# Patient Record
Sex: Female | Born: 1985 | Race: Black or African American | Hispanic: No | Marital: Single | Smoking: Former smoker
Health system: Southern US, Community
[De-identification: ages and names within clinical notes are randomized; demographics above are authoritative.]

## PROBLEM LIST (undated history)

## (undated) DIAGNOSIS — I2510 Atherosclerotic heart disease of native coronary artery without angina pectoris: Secondary | ICD-10-CM

## (undated) DIAGNOSIS — J45909 Unspecified asthma, uncomplicated: Secondary | ICD-10-CM

## (undated) DIAGNOSIS — E7800 Pure hypercholesterolemia, unspecified: Secondary | ICD-10-CM

## (undated) DIAGNOSIS — I214 Non-ST elevation (NSTEMI) myocardial infarction: Secondary | ICD-10-CM

## (undated) HISTORY — PX: BREAST LUMPECTOMY: SHX2

## (undated) HISTORY — PX: CARDIAC CATHETERIZATION: SHX172

---

## 2014-05-30 ENCOUNTER — Encounter (HOSPITAL_BASED_OUTPATIENT_CLINIC_OR_DEPARTMENT_OTHER): Payer: Self-pay | Admitting: Emergency Medicine

## 2014-05-30 DIAGNOSIS — Z720 Tobacco use: Secondary | ICD-10-CM | POA: Insufficient documentation

## 2014-05-30 DIAGNOSIS — J45909 Unspecified asthma, uncomplicated: Secondary | ICD-10-CM | POA: Insufficient documentation

## 2014-05-30 DIAGNOSIS — Z79899 Other long term (current) drug therapy: Secondary | ICD-10-CM | POA: Insufficient documentation

## 2014-05-30 DIAGNOSIS — N946 Dysmenorrhea, unspecified: Secondary | ICD-10-CM | POA: Insufficient documentation

## 2014-05-30 DIAGNOSIS — Z3202 Encounter for pregnancy test, result negative: Secondary | ICD-10-CM | POA: Insufficient documentation

## 2014-05-30 NOTE — ED Notes (Signed)
Pt states she has had abd pain for 2-3 days

## 2014-05-31 ENCOUNTER — Ambulatory Visit (HOSPITAL_BASED_OUTPATIENT_CLINIC_OR_DEPARTMENT_OTHER)
Admit: 2014-05-31 | Discharge: 2014-05-31 | Disposition: A | Discharge status: Home / Self Care | Payer: Self-pay | Attending: Emergency Medicine | Admitting: Emergency Medicine

## 2014-05-31 ENCOUNTER — Ambulatory Visit (HOSPITAL_BASED_OUTPATIENT_CLINIC_OR_DEPARTMENT_OTHER)
Admission: RE | Admit: 2014-05-31 | Discharge: 2014-05-31 | Disposition: A | Discharge status: Home / Self Care | Payer: Self-pay | Source: Ambulatory Visit | Attending: Emergency Medicine | Admitting: Emergency Medicine

## 2014-05-31 ENCOUNTER — Emergency Department (HOSPITAL_BASED_OUTPATIENT_CLINIC_OR_DEPARTMENT_OTHER)
Admission: EM | Admit: 2014-05-31 | Discharge: 2014-05-31 | Disposition: A | Discharge status: Home / Self Care | Payer: Self-pay | Attending: Emergency Medicine | Admitting: Emergency Medicine

## 2014-05-31 DIAGNOSIS — N926 Irregular menstruation, unspecified: Secondary | ICD-10-CM | POA: Insufficient documentation

## 2014-05-31 DIAGNOSIS — N946 Dysmenorrhea, unspecified: Secondary | ICD-10-CM

## 2014-05-31 HISTORY — DX: Unspecified asthma, uncomplicated: J45.909

## 2014-05-31 LAB — URINALYSIS, ROUTINE W REFLEX MICROSCOPIC
BILIRUBIN URINE: NEGATIVE
Glucose, UA: NEGATIVE mg/dL
Ketones, ur: NEGATIVE mg/dL
Leukocytes, UA: NEGATIVE
NITRITE: NEGATIVE
PH: 5.5 (ref 5.0–8.0)
Protein, ur: NEGATIVE mg/dL
Specific Gravity, Urine: 1.03 (ref 1.005–1.030)
Urobilinogen, UA: 0.2 mg/dL (ref 0.0–1.0)

## 2014-05-31 LAB — URINE MICROSCOPIC-ADD ON

## 2014-05-31 LAB — GC/CHLAMYDIA PROBE AMP (~~LOC~~) NOT AT ARMC
Chlamydia: NEGATIVE
Neisseria Gonorrhea: NEGATIVE

## 2014-05-31 LAB — PREGNANCY, URINE: Preg Test, Ur: NEGATIVE

## 2014-05-31 MED ORDER — HYDROCODONE-ACETAMINOPHEN 5-325 MG PO TABS: 1.0000 | ORAL_TABLET | Freq: Four times a day (QID) | ORAL | Status: DC | PRN

## 2014-05-31 NOTE — ED Provider Notes (Signed)
CSN: [id number]     Arrival date & time 05/30/14  2303 History   First MD Initiated Contact with Patient 05/31/14 0103     Chief Complaint  Patient presents with  . Abdominal Pain     (Consider location/radiation/quality/duration/timing/severity/associated sxs/prior Treatment) HPI  This is a 29 year old female with a history of painful menses. She is here with menses that began 4 days ago. She is having pelvic pain associated with this cycle which is worse than previous cycles. She has difficulty characterizing the pain but states it is moderate to severe. It is worse with movement or palpation. It is only partly eased by ibuprofen. She has had chills. She denies nausea, vomiting or dysuria. She has had lightheadedness with it.  Past Medical History  Diagnosis Date  . Asthma    History reviewed. No pertinent past surgical history. History reviewed. No pertinent family history. History  Substance Use Topics  . Smoking status: Current Every Day Smoker  . Smokeless tobacco: Not on file  . Alcohol Use: No   OB History    No data available     Review of Systems  All other systems reviewed and are negative.   Allergies  Review of patient's allergies indicates no known allergies.  Home Medications   Prior to Admission medications   Medication Sig Start Date End Date Taking? Authorizing Provider  ALBUTEROL IN Inhale into the lungs.   Yes Historical Provider, MD   BP 113/73 mmHg  Pulse 65  Temp(Src) 98.7 F (37.1 C) (Oral)  Resp 18  Ht 5\' 8"  (1.727 m)  Wt 145 lb (65.772 kg)  BMI 22.05 kg/m2  SpO2 100%  LMP 05/30/2014   Physical Exam  General: Well-developed, well-nourished female in no acute distress; appearance consistent with age of record HENT: normocephalic; atraumatic Eyes: pupils equal, round and reactive to light; extraocular muscles intact Neck: supple Heart: regular rate and rhythm Lungs: clear to auscultation bilaterally Abdomen: soft; nondistended;  suprapubic tenderness, right greater than left; no masses or hepatosplenomegaly; bowel sounds present GU: Normal external genitalia; vaginal bleeding; no cervical motion tenderness; adnexal tenderness, right greater than left Extremities: No deformity; full range of motion; pulses normal Neurologic: Sleeping but readily awakened, alert and oriented; motor function intact in all extremities and symmetric; no facial droop Skin: Warm and dry Psychiatric: Flat affect    ED Course  Procedures (including critical care time)   MDM   Nursing notes and vitals signs, including pulse oximetry, reviewed.  Summary of this visit's results, reviewed by myself:  Labs:  Results for orders placed or performed during the hospital encounter of 05/31/14 (from the past 24 hour(s))  Urinalysis, Routine w reflex microscopic     Status: Abnormal   Collection Time: 05/31/14 12:36 AM  Result Value Ref Range   Color, Urine YELLOW YELLOW   APPearance CLEAR CLEAR   Specific Gravity, Urine 1.030 1.005 - 1.030   pH 5.5 5.0 - 8.0   Glucose, UA NEGATIVE NEGATIVE mg/dL   Hgb urine dipstick LARGE (A) NEGATIVE   Bilirubin Urine NEGATIVE NEGATIVE   Ketones, ur NEGATIVE NEGATIVE mg/dL   Protein, ur NEGATIVE NEGATIVE mg/dL   Urobilinogen, UA 0.2 0.0 - 1.0 mg/dL   Nitrite NEGATIVE NEGATIVE   Leukocytes, UA NEGATIVE NEGATIVE  Pregnancy, urine     Status: None   Collection Time: 05/31/14 12:36 AM  Result Value Ref Range   Preg Test, Ur NEGATIVE NEGATIVE  Urine microscopic-add on     Status: Abnormal  Collection Time: 05/31/14 12:36 AM  Result Value Ref Range   Squamous Epithelial / LPF FEW (A) RARE   WBC, UA 0-2 <3 WBC/hpf   RBC / HPF 7-10 <3 RBC/hpf   Bacteria, UA FEW (A) RARE   Urine-Other MUCOUS PRESENT    1:24 AM We'll have patient return for pelvic ultrasound later today. We have referred her to [facility] and she has no local physician.   Paula Libra, MD 05/31/14 [phone]

## 2015-07-05 ENCOUNTER — Encounter (HOSPITAL_BASED_OUTPATIENT_CLINIC_OR_DEPARTMENT_OTHER): Payer: Self-pay | Admitting: *Deleted

## 2015-07-05 ENCOUNTER — Emergency Department (HOSPITAL_BASED_OUTPATIENT_CLINIC_OR_DEPARTMENT_OTHER)
Admission: EM | Admit: 2015-07-05 | Discharge: 2015-07-05 | Disposition: A | Discharge status: Home / Self Care | Payer: Self-pay | Attending: Emergency Medicine | Admitting: Emergency Medicine

## 2015-07-05 DIAGNOSIS — N760 Acute vaginitis: Secondary | ICD-10-CM | POA: Insufficient documentation

## 2015-07-05 DIAGNOSIS — Z79899 Other long term (current) drug therapy: Secondary | ICD-10-CM | POA: Insufficient documentation

## 2015-07-05 DIAGNOSIS — J45909 Unspecified asthma, uncomplicated: Secondary | ICD-10-CM | POA: Insufficient documentation

## 2015-07-05 DIAGNOSIS — Z3202 Encounter for pregnancy test, result negative: Secondary | ICD-10-CM | POA: Insufficient documentation

## 2015-07-05 DIAGNOSIS — N946 Dysmenorrhea, unspecified: Secondary | ICD-10-CM | POA: Insufficient documentation

## 2015-07-05 DIAGNOSIS — F17210 Nicotine dependence, cigarettes, uncomplicated: Secondary | ICD-10-CM | POA: Insufficient documentation

## 2015-07-05 DIAGNOSIS — Z8639 Personal history of other endocrine, nutritional and metabolic disease: Secondary | ICD-10-CM | POA: Insufficient documentation

## 2015-07-05 HISTORY — DX: Pure hypercholesterolemia, unspecified: E78.00

## 2015-07-05 LAB — URINE MICROSCOPIC-ADD ON

## 2015-07-05 LAB — URINALYSIS, ROUTINE W REFLEX MICROSCOPIC
Bilirubin Urine: NEGATIVE
Glucose, UA: NEGATIVE mg/dL
KETONES UR: NEGATIVE mg/dL
Leukocytes, UA: NEGATIVE
Nitrite: NEGATIVE
PROTEIN: 30 mg/dL — AB
Specific Gravity, Urine: 1.01 (ref 1.005–1.030)
pH: 6.5 (ref 5.0–8.0)

## 2015-07-05 LAB — WET PREP, GENITAL
Sperm: NONE SEEN
Trich, Wet Prep: NONE SEEN
Yeast Wet Prep HPF POC: NONE SEEN

## 2015-07-05 LAB — PREGNANCY, URINE: PREG TEST UR: NEGATIVE

## 2015-07-05 MED ORDER — IBUPROFEN 800 MG PO TABS
800.0000 mg | ORAL_TABLET | Freq: Once | ORAL | Status: AC
  Administered 2015-07-05: 800 mg via ORAL
  Filled 2015-07-05: qty 1

## 2015-07-05 MED ORDER — METRONIDAZOLE 500 MG PO TABS: 500.0000 mg | ORAL_TABLET | Freq: Two times a day (BID) | ORAL | Status: DC

## 2015-07-05 NOTE — ED Provider Notes (Signed)
CSN: [id number]     Arrival date & time 07/05/15  4098 History   First MD Initiated Contact with Patient 07/05/15 [phone]     Chief Complaint  Patient presents with  . Abdominal Pain     (Consider location/radiation/quality/duration/timing/severity/associated sxs/prior Treatment) HPI Comments: 30 year old female with history of asthma presents for pain with her menses. The patient reports that for 10 years or more she has had severe pain when she has her menstrual period. The patient reports that she has been evaluated in the past by an OB/GYN but it has been several years. She has refused hormonal or oral contraceptive treatment for this issue. She is currently attempting to get pregnant. She reports this is the normal time that she would expect her period. Her period started last night. She says that she took 800 mg of ibuprofen for it last night. She says that the pain is unbearable and it will feel like somebody is stabbing her with a knife. This is the same pain she always gets. She denies fevers, nausea, vomiting, diarrhea, constipation. No chest pain or shortness of breath. Reports normal urination. She says she was seen by her PCP last week and had a negative pregnancy test. She denies any excessive or heavy bleeding.  Patient is a 30 y.o. female presenting with abdominal pain.  Abdominal Pain Associated symptoms: vaginal bleeding   Associated symptoms: no chest pain, no chills, no constipation, no cough, no diarrhea, no dysuria, no fatigue, no fever, no nausea, no shortness of breath, no vaginal discharge and no vomiting     Past Medical History  Diagnosis Date  . Asthma   . High cholesterol    History reviewed. No pertinent past surgical history. No family history on file. Social History  Substance Use Topics  . Smoking status: Current Every Day Smoker -- 0.50 packs/day    Types: Cigarettes  . Smokeless tobacco: None  . Alcohol Use: No   OB History    No data available      Review of Systems  Constitutional: Negative for fever, chills and fatigue.  HENT: Negative for congestion, postnasal drip, rhinorrhea and sinus pressure.   Eyes: Negative for visual disturbance.  Respiratory: Negative for cough and shortness of breath.   Cardiovascular: Negative for chest pain and palpitations.  Gastrointestinal: Positive for abdominal pain. Negative for nausea, vomiting, diarrhea, constipation and abdominal distention.  Genitourinary: Positive for vaginal bleeding and vaginal pain. Negative for dysuria, urgency, frequency, flank pain, vaginal discharge and pelvic pain.  Musculoskeletal: Negative for myalgias and back pain.  Skin: Negative for rash.  Neurological: Negative for dizziness, weakness, light-headedness and headaches.  Hematological: Does not bruise/bleed easily.      Allergies  Review of patient's allergies indicates no known allergies.  Home Medications   Prior to Admission medications   Medication Sig Start Date End Date Taking? Authorizing Provider  ALBUTEROL IN Inhale into the lungs.   Yes Historical Provider, MD  metroNIDAZOLE (FLAGYL) 500 MG tablet Take 1 tablet (500 mg total) by mouth 2 (two) times daily. 07/05/15   Leta Baptist, MD   LMP 07/04/2015 Physical Exam  Constitutional: She is oriented to person, place, and time. She appears well-developed and well-nourished. No distress.  HENT:  Head: Normocephalic and atraumatic.  Right Ear: External ear normal.  Left Ear: External ear normal.  Nose: Nose normal.  Mouth/Throat: Oropharynx is clear and moist. No oropharyngeal exudate.  Eyes: EOM are normal. Pupils are equal, round, and reactive to light.  Neck: Normal range of motion. Neck supple.  Cardiovascular: Normal rate, regular rhythm, normal heart sounds and intact distal pulses.   No murmur heard. Pulmonary/Chest: Effort normal. No respiratory distress. She has no wheezes. She has no rales.  Abdominal: Soft. She exhibits no  distension. There is no tenderness (no tenderness on examination while talking to patient).  Genitourinary: Cervix exhibits no motion tenderness, no discharge and no friability. Right adnexum displays no mass and no fullness. Left adnexum displays no mass and no fullness. There is bleeding (mild) in the vagina. No tenderness in the vagina. No vaginal discharge found.  Patient with mild discomfort on pelvic examination along the pelvic area bilaterally  Musculoskeletal: Normal range of motion. She exhibits no edema or tenderness.  Neurological: She is alert and oriented to person, place, and time.  Skin: Skin is warm and dry. No rash noted. She is not diaphoretic.  Vitals reviewed.   ED Course  Procedures (including critical care time) Labs Review Labs Reviewed  WET PREP, GENITAL - Abnormal; Notable for the following:    Clue Cells Wet Prep HPF POC PRESENT (*)    WBC, Wet Prep HPF POC MANY (*)    All other components within normal limits  URINALYSIS, ROUTINE W REFLEX MICROSCOPIC (NOT AT [facility]) - Abnormal; Notable for the following:    Hgb urine dipstick LARGE (*)    Protein, ur 30 (*)    All other components within normal limits  URINE MICROSCOPIC-ADD ON - Abnormal; Notable for the following:    Squamous Epithelial / LPF 0-5 (*)    Bacteria, UA MANY (*)    Casts HYALINE CASTS (*)    All other components within normal limits  PREGNANCY, URINE  GC/CHLAMYDIA PROBE AMP ([place]) NOT AT [facility]    Imaging Review No results found. I have personally reviewed and evaluated these images and lab results as part of my medical decision-making.   EKG Interpretation None      MDM  Patient was seen and evaluated in stable condition. Mild amount of bleeding on pelvic examination consistent with menses. Patient not pregnant. Examination benign. Wet prep positive for BV. Patient updated on results. She was provided with a prescription for Flagyl. She was discharged home with instruction to  continue to take her ibuprofen and to complete the Flagyl and follow up with OB/GYN outpatient. Patient expressed understanding and agreement with plan of care. Final diagnoses:  Dysmenorrhea    1. Dysmenorrhea 2. Bacterial vaginosis    Leta BaptistEmily Roe Eden Rho, MD 07/05/15 1012

## 2015-07-05 NOTE — Discharge Instructions (Signed)
You were seen and evaluated today for your painful periods. Continue to use ibuprofen as prescribed by her primary care physician. Use heating pads as needed but never sleep with one on. You are also found on examination to have was called bacterial vaginosis. This can cause increase in your pain. Take the prescription antibiotics as directed. Follow up outpatient with an OB/GYN for more definitive management.   Bacterial Vaginosis Bacterial vaginosis is a vaginal infection that occurs when the normal balance of bacteria in the vagina is disrupted. It results from an overgrowth of certain bacteria. This is the most common vaginal infection in women of childbearing age. Treatment is important to prevent complications, especially in pregnant women, as it can cause a premature delivery. CAUSES  Bacterial vaginosis is caused by an increase in harmful bacteria that are normally present in smaller amounts in the vagina. Several different kinds of bacteria can cause bacterial vaginosis. However, the reason that the condition develops is not fully understood. RISK FACTORS Certain activities or behaviors can put you at an increased risk of developing bacterial vaginosis, including:  Having a new sex partner or multiple sex partners.  Douching.  Using an intrauterine device (IUD) for contraception. Women do not get bacterial vaginosis from toilet seats, bedding, swimming pools, or contact with objects around them. SIGNS AND SYMPTOMS  Some women with bacterial vaginosis have no signs or symptoms. Common symptoms include:  Grey vaginal discharge.  A fishlike odor with discharge, especially after sexual intercourse.  Itching or burning of the vagina and vulva.  Burning or pain with urination. DIAGNOSIS  Your health care provider will take a medical history and examine the vagina for signs of bacterial vaginosis. A sample of vaginal fluid may be taken. Your health care provider will look at this sample  under a microscope to check for bacteria and abnormal cells. A vaginal pH test may also be done.  TREATMENT  Bacterial vaginosis may be treated with antibiotic medicines. These may be given in the form of a pill or a vaginal cream. A second round of antibiotics may be prescribed if the condition comes back after treatment. Because bacterial vaginosis increases your risk for sexually transmitted diseases, getting treated can help reduce your risk for chlamydia, gonorrhea, HIV, and herpes. HOME CARE INSTRUCTIONS   Only take over-the-counter or prescription medicines as directed by your health care provider.  If antibiotic medicine was prescribed, take it as directed. Make sure you finish it even if you start to feel better.  Tell all sexual partners that you have a vaginal infection. They should see their health care provider and be treated if they have problems, such as a mild rash or itching.  During treatment, it is important that you follow these instructions:  Avoid sexual activity or use condoms correctly.  Do not douche.  Avoid alcohol as directed by your health care provider.  Avoid breastfeeding as directed by your health care provider. SEEK MEDICAL CARE IF:   Your symptoms are not improving after 3 days of treatment.  You have increased discharge or pain.  You have a fever. MAKE SURE YOU:   Understand these instructions.  Will watch your condition.  Will get help right away if you are not doing well or get worse. FOR MORE INFORMATION  Centers for Disease Control and Prevention, Division of STD Prevention: [employer] American Sexual Health Association (ASHA): www.ashastd.org    This information is not intended to replace advice given to you by your health care  provider. Make sure you discuss any questions you have with your health care provider.   Document Released: 02/25/2005 Document Revised: 03/18/2014 Document Reviewed: 10/07/2012 Elsevier Interactive Patient  Education 2016 Elsevier Inc.   Dysmenorrhea Menstrual cramps (dysmenorrhea) are caused by the muscles of the uterus tightening (contracting) during a menstrual period. For some women, this discomfort is merely bothersome. For others, dysmenorrhea can be severe enough to interfere with everyday activities for a few days each month. Primary dysmenorrhea is menstrual cramps that last a couple of days when you start having menstrual periods or soon after. This often begins after a teenager starts having her period. As a woman gets older or has a baby, the cramps will usually lessen or disappear. Secondary dysmenorrhea begins later in life, lasts longer, and the pain may be stronger than primary dysmenorrhea. The pain may start before the period and last a few days after the period.  CAUSES  Dysmenorrhea is usually caused by an underlying problem, such as:  The tissue lining the uterus grows outside of the uterus in other areas of the body (endometriosis).  The endometrial tissue, which normally lines the uterus, is found in or grows into the muscular walls of the uterus (adenomyosis).  The pelvic blood vessels are engorged with blood just before the menstrual period (pelvic congestive syndrome).  Overgrowth of cells (polyps) in the lining of the uterus or cervix.  Falling down of the uterus (prolapse) because of loose or stretched ligaments.  Depression.  Bladder problems, infection, or inflammation.  Problems with the intestine, a tumor, or irritable bowel syndrome.  Cancer of the female organs or bladder.  A severely tipped uterus.  A very tight opening or closed cervix.  Noncancerous tumors of the uterus (fibroids).  Pelvic inflammatory disease (PID).  Pelvic scarring (adhesions) from a previous surgery.  Ovarian cyst.  An intrauterine device (IUD) used for birth control. RISK FACTORS You may be at greater risk of dysmenorrhea if:  You are younger than age 56.  You  started puberty early.  You have irregular or heavy bleeding.  You have never given birth.  You have a family history of this problem.  You are a smoker. SIGNS AND SYMPTOMS   Cramping or throbbing pain in your lower abdomen.  Headaches.  Lower back pain.  Nausea or vomiting.  Diarrhea.  Sweating or dizziness.  Loose stools. DIAGNOSIS  A diagnosis is based on your history, symptoms, physical exam, diagnostic tests, or procedures. Diagnostic tests or procedures may include:  Blood tests.  Ultrasonography.  An examination of the lining of the uterus (dilation and curettage, D&C).  An examination inside your abdomen or pelvis with a scope (laparoscopy).  X-rays.  CT scan.  MRI.  An examination inside the bladder with a scope (cystoscopy).  An examination inside the intestine or stomach with a scope (colonoscopy, gastroscopy). TREATMENT  Treatment depends on the cause of the dysmenorrhea. Treatment may include:  Pain medicine prescribed by your health care provider.  Birth control pills or an IUD with progesterone hormone in it.  Hormone replacement therapy.  Nonsteroidal anti-inflammatory drugs (NSAIDs). These may help stop the production of prostaglandins.  Surgery to remove adhesions, endometriosis, ovarian cyst, or fibroids.  Removal of the uterus (hysterectomy).  Progesterone shots to stop the menstrual period.  Cutting the nerves on the sacrum that go to the female organs (presacral neurectomy).  Electric current to the sacral nerves (sacral nerve stimulation).  Antidepressant medicine.  Psychiatric therapy, counseling, or group therapy.  Exercise and physical therapy.  Meditation and yoga therapy.  Acupuncture. HOME CARE INSTRUCTIONS   Only take over-the-counter or prescription medicines as directed by your health care provider.  Place a heating pad or hot water bottle on your lower back or abdomen. Do not sleep with the heating  pad.  Use aerobic exercises, walking, swimming, biking, and other exercises to help lessen the cramping.  Massage to the lower back or abdomen may help.  Stop smoking.  Avoid alcohol and caffeine. SEEK MEDICAL CARE IF:   Your pain does not get better with medicine.  You have pain with sexual intercourse.  Your pain increases and is not controlled with medicines.  You have abnormal vaginal bleeding with your period.  You develop nausea or vomiting with your period that is not controlled with medicine. SEEK IMMEDIATE MEDICAL CARE IF:  You pass out.    This information is not intended to replace advice given to you by your health care provider. Make sure you discuss any questions you have with your health care provider.   Document Released: 02/25/2005 Document Revised: 10/28/2012 Document Reviewed: 08/13/2012 Elsevier Interactive Patient Education [employer] Elsevier Inc.

## 2015-07-05 NOTE — ED Notes (Signed)
C/o abd pain  Low mid abd pain . Menstral cycle started last pm. States she always has abd pain with cycles. No vaginal discharge.

## 2015-07-06 LAB — GC/CHLAMYDIA PROBE AMP (~~LOC~~) NOT AT ARMC
Chlamydia: NEGATIVE
Neisseria Gonorrhea: NEGATIVE

## 2016-04-15 IMAGING — US US TRANSVAGINAL NON-OB
1 series · 14 of 25 positions shown · non-contrast
Comparison: 11/04/2009.

CLINICAL DATA: Irregular menses.

EXAM:
TRANSABDOMINAL AND TRANSVAGINAL ULTRASOUND OF PELVIS
TECHNIQUE: Both transabdominal and transvaginal ultrasound examinations of the
pelvis were performed. Transabdominal technique was performed for
global imaging of the pelvis including uterus, ovaries, adnexal
regions, and pelvic cul-de-sac. It was necessary to proceed with
endovaginal exam following the transabdominal exam to visualize the
uterus and ovaries..

[Series 1: us transvaginal non-ob · 0.24mm/px · 14 of 55 slices shown]
[im 1/55]
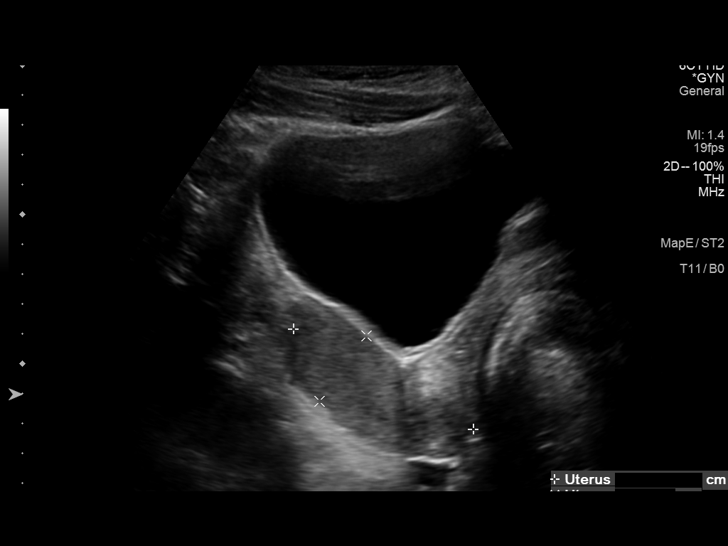
[im 5/55]
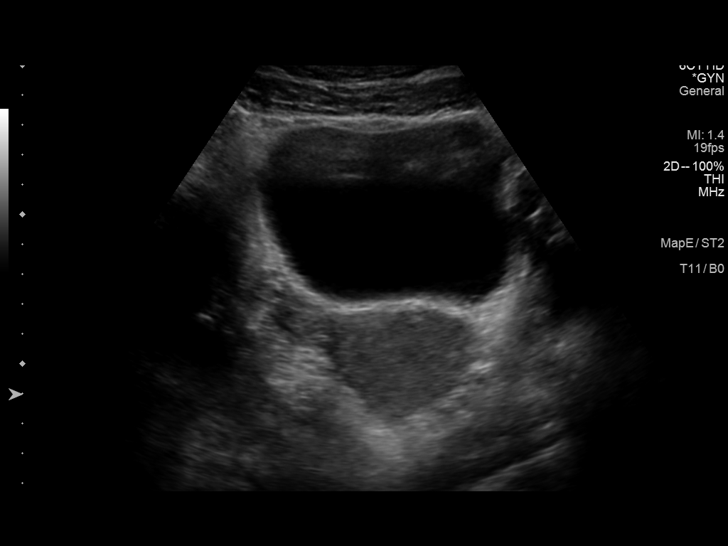
[im 10/55]
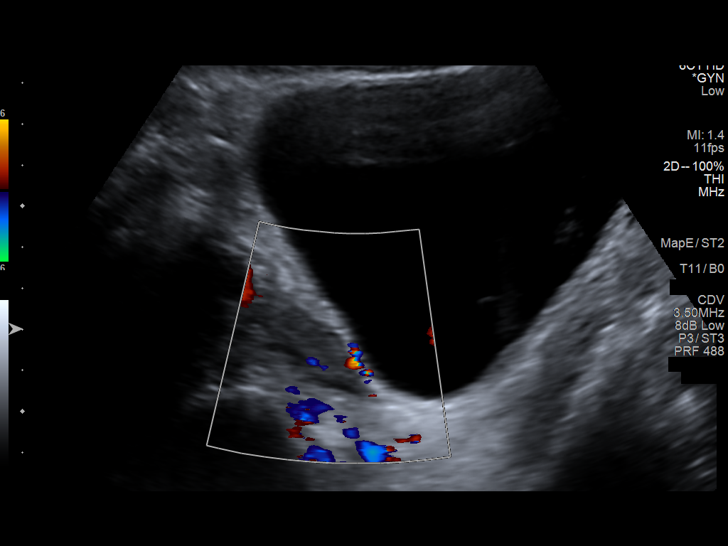
[im 14/55]
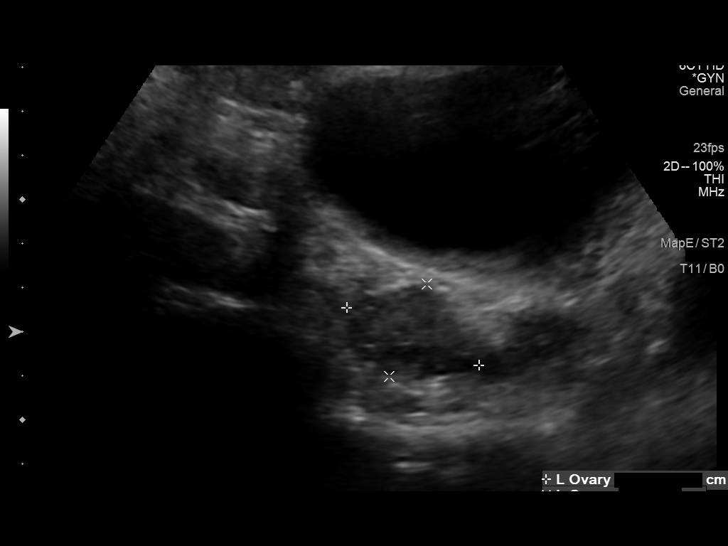
[im 19/55]
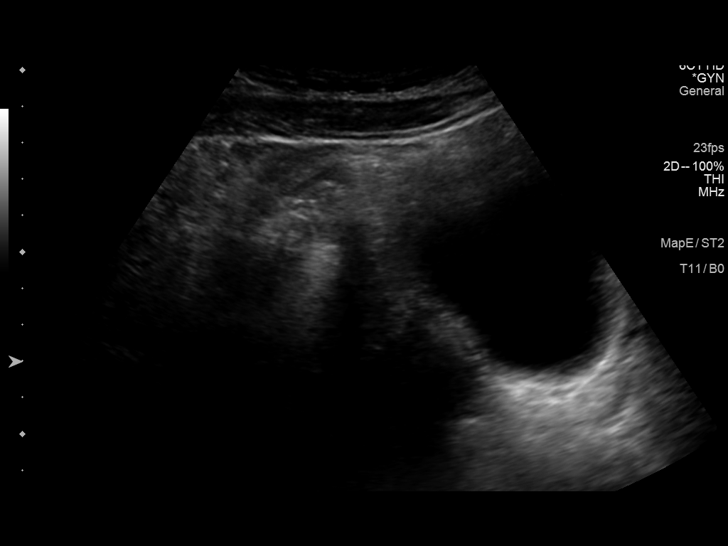
[im 21/55]
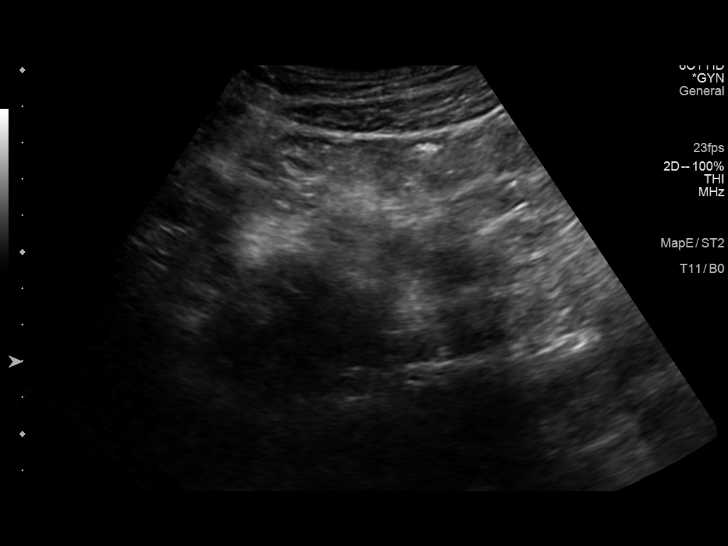
[im 25/55]
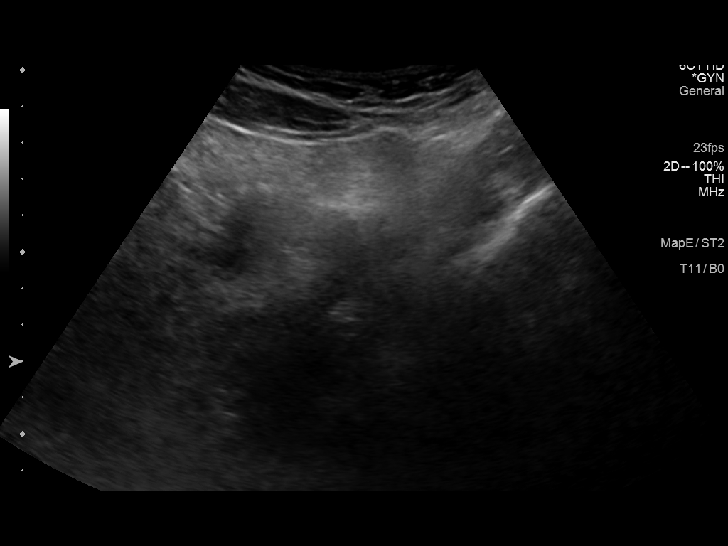
[im 30/55]
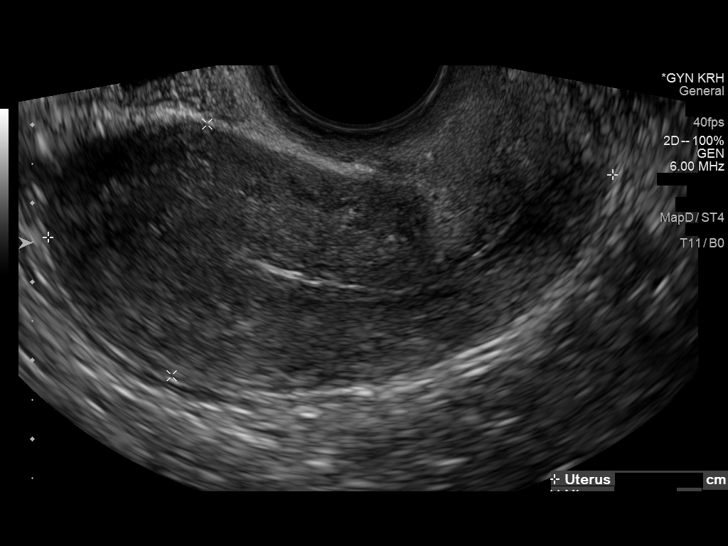
[im 34/55]
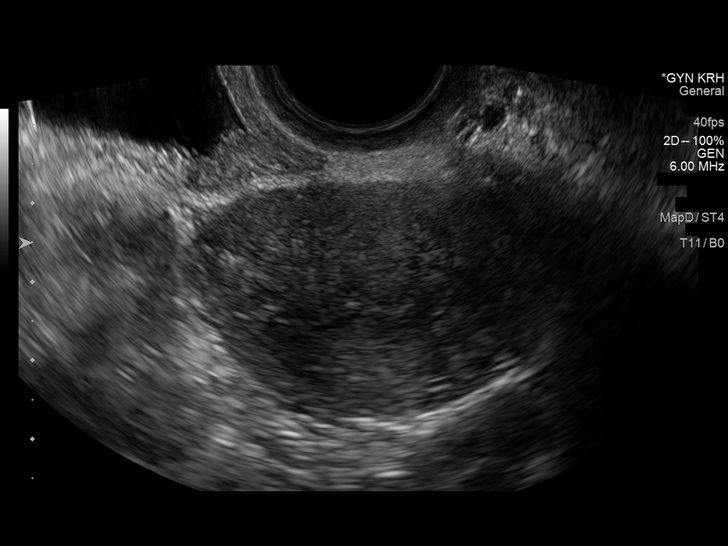
[im 37/55]
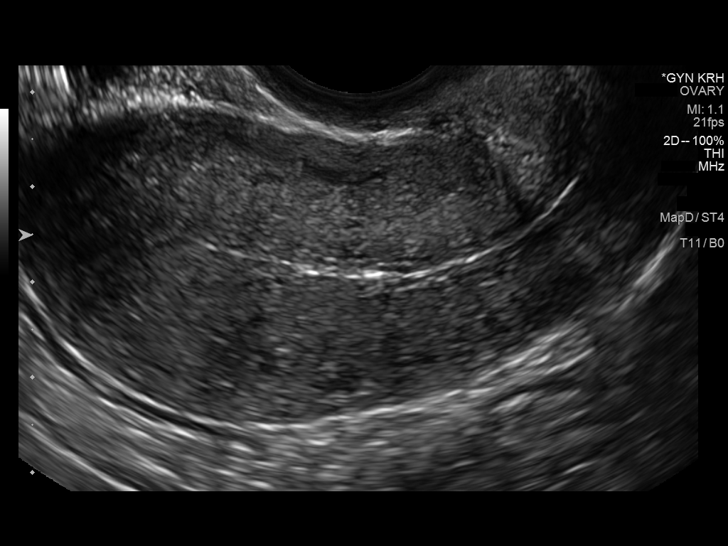
[im 41/55]
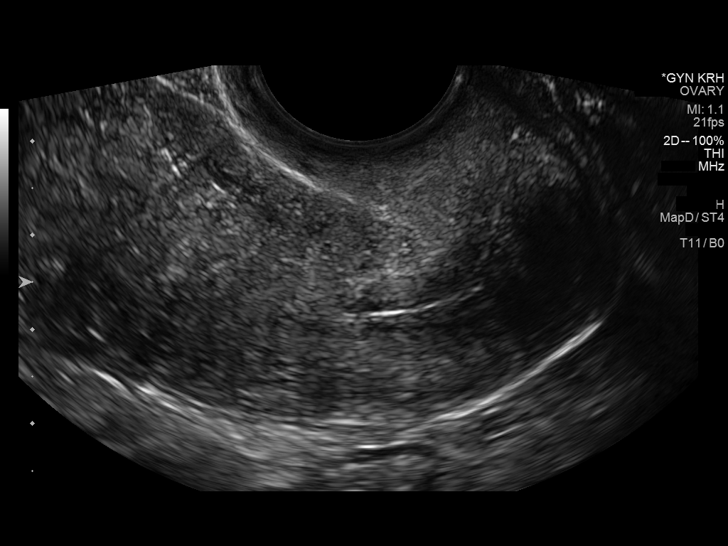
[im 46/55]
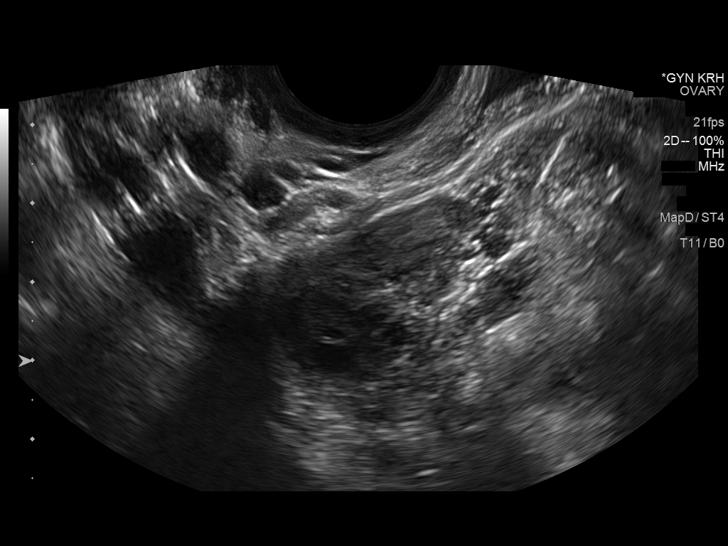
[im 50/55]
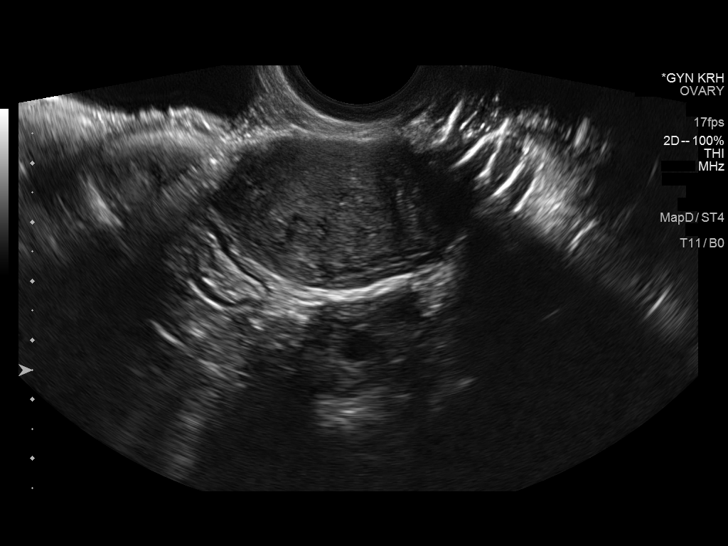
[im 55/55]
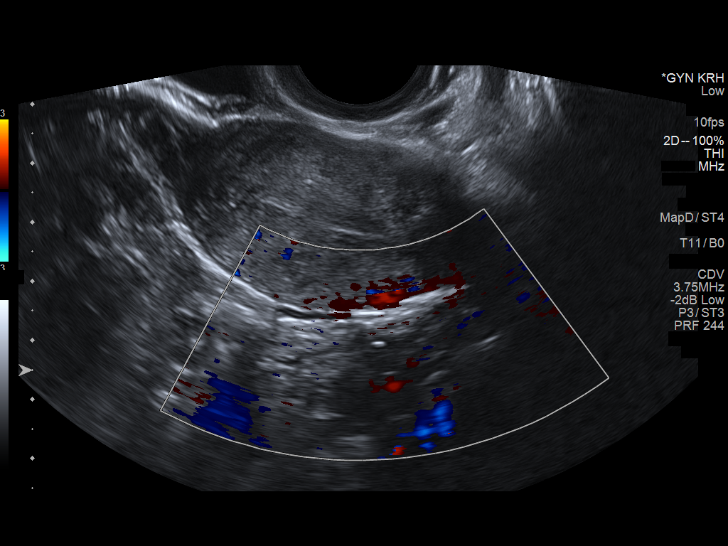

[14 of 25 positions shown; findings below may reference images not displayed]

FINDINGS: Uterus

Measurements: 7.2 x 3.2 x 5.1 cm. No fibroids or other mass
visualized. Small nabothian cysts.

Endometrium

Thickness: 4 mm.  No focal abnormality visualized.

Right ovary

Measurements: 3.2 x 1.6 x 2.8 cm. Normal appearance/no adnexal mass.

Left ovary

Measurements: 3.3 x 2.3 x 2.7 cm. Normal appearance/no adnexal mass.

Other findings

Trace free pelvic fluid.
IMPRESSION: Trace free pelvic fluid, otherwise negative exam.

## 2016-07-22 ENCOUNTER — Encounter (HOSPITAL_BASED_OUTPATIENT_CLINIC_OR_DEPARTMENT_OTHER): Payer: Self-pay

## 2016-07-22 ENCOUNTER — Emergency Department (HOSPITAL_BASED_OUTPATIENT_CLINIC_OR_DEPARTMENT_OTHER)
Admission: EM | Admit: 2016-07-22 | Discharge: 2016-07-23 | Disposition: A | Discharge status: Home / Self Care | Payer: Self-pay | Attending: Emergency Medicine | Admitting: Emergency Medicine

## 2016-07-22 DIAGNOSIS — F17210 Nicotine dependence, cigarettes, uncomplicated: Secondary | ICD-10-CM | POA: Insufficient documentation

## 2016-07-22 DIAGNOSIS — N390 Urinary tract infection, site not specified: Secondary | ICD-10-CM

## 2016-07-22 DIAGNOSIS — R319 Hematuria, unspecified: Secondary | ICD-10-CM

## 2016-07-22 DIAGNOSIS — A5901 Trichomonal vulvovaginitis: Secondary | ICD-10-CM

## 2016-07-22 DIAGNOSIS — J45909 Unspecified asthma, uncomplicated: Secondary | ICD-10-CM | POA: Insufficient documentation

## 2016-07-22 LAB — URINALYSIS, ROUTINE W REFLEX MICROSCOPIC
Glucose, UA: NEGATIVE mg/dL
Ketones, ur: 40 mg/dL — AB
NITRITE: POSITIVE — AB
PROTEIN: 100 mg/dL — AB
Specific Gravity, Urine: 1.023 (ref 1.005–1.030)
pH: 7 (ref 5.0–8.0)

## 2016-07-22 LAB — WET PREP, GENITAL
SPERM: NONE SEEN
Yeast Wet Prep HPF POC: NONE SEEN

## 2016-07-22 LAB — URINALYSIS, MICROSCOPIC (REFLEX)

## 2016-07-22 LAB — PREGNANCY, URINE: PREG TEST UR: NEGATIVE

## 2016-07-22 MED ORDER — CEPHALEXIN 500 MG PO CAPS: 500.0000 mg | ORAL_CAPSULE | Freq: Three times a day (TID) | ORAL | 0 refills | Status: DC

## 2016-07-22 MED ORDER — CEFTRIAXONE SODIUM 250 MG IJ SOLR
250.0000 mg | Freq: Once | INTRAMUSCULAR | Status: AC
  Administered 2016-07-22: 250 mg via INTRAMUSCULAR
  Filled 2016-07-22: qty 250

## 2016-07-22 MED ORDER — PHENAZOPYRIDINE HCL 200 MG PO TABS: 200.0000 mg | ORAL_TABLET | Freq: Three times a day (TID) | ORAL | 0 refills | Status: DC

## 2016-07-22 MED ORDER — CEPHALEXIN 250 MG PO CAPS
500.0000 mg | ORAL_CAPSULE | Freq: Once | ORAL | Status: AC
  Administered 2016-07-22: 500 mg via ORAL
  Filled 2016-07-22: qty 2

## 2016-07-22 MED ORDER — METRONIDAZOLE 500 MG PO TABS: 500.0000 mg | ORAL_TABLET | Freq: Two times a day (BID) | ORAL | 0 refills | Status: DC

## 2016-07-22 MED ORDER — METRONIDAZOLE 500 MG PO TABS
500.0000 mg | ORAL_TABLET | Freq: Once | ORAL | Status: AC
  Administered 2016-07-22: 500 mg via ORAL
  Filled 2016-07-22: qty 1

## 2016-07-22 MED ORDER — PHENAZOPYRIDINE HCL 100 MG PO TABS
200.0000 mg | ORAL_TABLET | Freq: Once | ORAL | Status: AC
  Administered 2016-07-22: 200 mg via ORAL
  Filled 2016-07-22: qty 2

## 2016-07-22 MED ORDER — AZITHROMYCIN 250 MG PO TABS
1000.0000 mg | ORAL_TABLET | Freq: Once | ORAL | Status: AC
  Administered 2016-07-22: 1000 mg via ORAL
  Filled 2016-07-22: qty 4

## 2016-07-22 NOTE — Discharge Instructions (Signed)
Take Keflex as prescribed until all gone for urinary tract infection. Take Flagyl as prescribed for trichomonas, take until all gone. No intercourse for a week. Follow-up with family doctor as needed.

## 2016-07-22 NOTE — ED Provider Notes (Signed)
[facility] Provider Note   CSN: [id number] Arrival date & time: 07/22/16  2104  By signing my name below, I, Susan Sexton, attest that this documentation has been prepared under the direction and in the presence of non-physician practitioner, Jaynie Crumble, PA-C. Electronically Signed: Modena Sexton, Scribe. 07/22/2016. 10:20 PM.  History   Chief Complaint Chief Complaint  Patient presents with  . Abdominal Pain   The history is provided by the patient. No language interpreter was used.   HPI Comments: Susan Sexton is a 31 y.o. female who presents to the Emergency Department complaining of constant moderate lower abdominal pain that started today. She went to [facility] ED, had blood drawn, and left without being seen today. She states she suspects she has a bladder infection. She reports associated vaginal bleeding (spotting) and urinary frequency and dysuria. Denies any current menstrual cycle, STD concern, fever, back pain, vaginal discharge, or other complaints at this time.  Past Medical History:  Diagnosis Date  . Asthma   . High cholesterol     There are no active problems to display for this patient.   History reviewed. No pertinent surgical history.  OB History    No data available       Home Medications    Prior to Admission medications   Medication Sig Start Date End Date Taking? Authorizing Provider  ALBUTEROL IN Inhale into the lungs.    [provider]    Family History No family history on file.  Social History Social History  Substance Use Topics  . Smoking status: Current Every Day Smoker    Packs/day: 0.50    Types: Cigarettes  . Smokeless tobacco: Never Used  . Alcohol use No     Allergies   Patient has no known allergies.   Review of Systems Review of Systems  Constitutional: Negative for chills and fever.  Gastrointestinal: Positive for abdominal pain. Negative for nausea and vomiting.    Genitourinary: Positive for dysuria, frequency and vaginal bleeding. Negative for vaginal discharge.  Musculoskeletal: Negative for back pain.  All other systems reviewed and are negative.    Physical Exam Updated Vital Signs BP (!) 137/95 (BP Location: Left Arm)   Pulse 92   Temp 99.2 F (37.3 C) (Oral)   Resp 18   Ht 5\' 8"  (1.727 m)   Wt 135 lb 4 oz (61.3 kg)   LMP 07/09/2016   SpO2 99%   BMI 20.56 kg/m   Physical Exam  Constitutional: She is oriented to person, place, and time. She appears well-developed and well-nourished. No distress.  HENT:  Head: Normocephalic.  Eyes: Conjunctivae are normal.  Neck: Neck supple.  Cardiovascular: Normal rate, regular rhythm and normal heart sounds.   Pulmonary/Chest: Effort normal and breath sounds normal. No respiratory distress. She has no wheezes.  Abdominal: Soft. Bowel sounds are normal. She exhibits no distension. There is no tenderness. There is no guarding.  Genitourinary:  Genitourinary Comments: Normal external genitalia. Normal vaginal canal. Small thin white discharge. Cervix is normal, closed. No CMT. No uterine or adnexal tenderness. No masses palpated.    Musculoskeletal: Normal range of motion.  Neurological: She is alert and oriented to person, place, and time.  Skin: Skin is warm and dry.  Psychiatric: She has a normal mood and affect.  Nursing note and vitals reviewed.    ED Treatments / Results  DIAGNOSTIC STUDIES: Oxygen Saturation is 99% on RA, normal by my interpretation.    COORDINATION  OF CARE: 10:25 PM- Pt advised of plan for treatment and pt agrees.  Labs (all labs ordered are listed, but only abnormal results are displayed) Labs Reviewed  WET PREP, GENITAL - Abnormal; Notable for the following:       Result Value   Trich, Wet Prep PRESENT (*)    Clue Cells Wet Prep HPF POC PRESENT (*)    WBC, Wet Prep HPF POC MODERATE (*)    All other components within normal limits  URINALYSIS, ROUTINE W  REFLEX MICROSCOPIC - Abnormal; Notable for the following:    Color, Urine RED (*)    APPearance TURBID (*)    Hgb urine dipstick LARGE (*)    Bilirubin Urine SMALL (*)    Ketones, ur 40 (*)    Protein, ur 100 (*)    Nitrite POSITIVE (*)    Leukocytes, UA LARGE (*)    All other components within normal limits  URINALYSIS, MICROSCOPIC (REFLEX) - Abnormal; Notable for the following:    Bacteria, UA FEW (*)    Squamous Epithelial / LPF 6-30 (*)    All other components within normal limits  PREGNANCY, URINE  GC/CHLAMYDIA PROBE AMP ([place]) NOT AT [facility]    EKG  EKG Interpretation None       Radiology No results found.  Procedures Procedures (including critical care time)  Medications Ordered in ED Medications  cephALEXin (KEFLEX) capsule 500 mg (500 mg Oral Given 07/22/16 2231)  phenazopyridine (PYRIDIUM) tablet 200 mg (200 mg Oral Given 07/22/16 2231)     Initial Impression / Assessment and Plan / ED Course  I have reviewed the triage vital signs and the nursing notes.  Pertinent labs & imaging results that were available during my care of the patient were reviewed by me and considered in my medical decision making (see chart for details).     Patient with urinary symptoms, urinalysis all the contaminated is positive for nitrates, large leukocytes, too numerous to count white blood cells with few bacteria. Will treat with Keflex, first dose given in the emergency department. Pelvic exam unremarkable. No bleeding on exam. No cervical motion tenderness. Her wet prep however is positive for trichomonas. I will treat her with Flagyl, also given Rocephin 250 mg IM and Zithromax 1 g by mouth in emergency department. Patient otherwise nontoxic appearing, afebrile, she is stable for discharge home with close outpatient follow-up.  Vitals:   07/22/16 2123  BP: (!) 137/95  Pulse: 92  Resp: 18  Temp: 99.2 F (37.3 C)  TempSrc: Oral  SpO2: 99%  Weight: 61.3 kg  Height: 5\' 8"   (1.727 m)     Final Clinical Impressions(s) / ED Diagnoses   Final diagnoses:  Urinary tract infection with hematuria, site unspecified  Trichomonal vaginitis    New Prescriptions Discharge Medication List as of 07/22/2016 11:52 PM    START taking these medications   Details  cephALEXin (KEFLEX) 500 MG capsule Take 1 capsule (500 mg total) by mouth 3 (three) times daily., Starting Mon 07/22/2016, Print    metroNIDAZOLE (FLAGYL) 500 MG tablet Take 1 tablet (500 mg total) by mouth 2 (two) times daily., Starting Mon 07/22/2016, Print    phenazopyridine (PYRIDIUM) 200 MG tablet Take 1 tablet (200 mg total) by mouth 3 (three) times daily., Starting Mon 07/22/2016, Print       I personally performed the services described in this documentation, which was scribed in my presence. The recorded information has been reviewed and is accurate.  Jaynie CrumbleKirichenko, Quorra Rosene, PA-C 07/23/16 Lovenia [id number]    Pickering, Nathan, MD 07/23/16 2337

## 2016-07-22 NOTE — ED Triage Notes (Signed)
c/o abd pain and vaginal spotting x today-NAD-steady gait-states she LWBS [facility] ED

## 2016-07-23 LAB — GC/CHLAMYDIA PROBE AMP (~~LOC~~) NOT AT ARMC
Chlamydia: NEGATIVE
Neisseria Gonorrhea: NEGATIVE

## 2017-09-08 HISTORY — PX: INCISION AND DRAINAGE BREAST ABSCESS: SUR672

## 2018-10-19 ENCOUNTER — Encounter (HOSPITAL_BASED_OUTPATIENT_CLINIC_OR_DEPARTMENT_OTHER): Payer: Self-pay | Admitting: *Deleted

## 2018-10-19 ENCOUNTER — Other Ambulatory Visit: Payer: Self-pay

## 2018-10-19 DIAGNOSIS — F17210 Nicotine dependence, cigarettes, uncomplicated: Secondary | ICD-10-CM | POA: Insufficient documentation

## 2018-10-19 DIAGNOSIS — R197 Diarrhea, unspecified: Secondary | ICD-10-CM | POA: Insufficient documentation

## 2018-10-19 DIAGNOSIS — J45909 Unspecified asthma, uncomplicated: Secondary | ICD-10-CM | POA: Insufficient documentation

## 2018-10-19 DIAGNOSIS — R1084 Generalized abdominal pain: Secondary | ICD-10-CM | POA: Insufficient documentation

## 2018-10-19 LAB — URINALYSIS, ROUTINE W REFLEX MICROSCOPIC
Bilirubin Urine: NEGATIVE
Glucose, UA: NEGATIVE mg/dL
Hgb urine dipstick: NEGATIVE
Ketones, ur: NEGATIVE mg/dL
Leukocytes,Ua: NEGATIVE
Nitrite: NEGATIVE
Protein, ur: NEGATIVE mg/dL
Specific Gravity, Urine: >1.03 — ABNORMAL HIGH (ref 1.005–1.030)
pH: 6 (ref 5.0–8.0)

## 2018-10-19 LAB — PREGNANCY, URINE: Preg Test, Ur: NEGATIVE

## 2018-10-19 NOTE — ED Triage Notes (Signed)
Abdominal pain and loose stools all day. Nausea. No vomiting. Feels like she is going to have a panic attack.

## 2018-10-20 ENCOUNTER — Emergency Department (HOSPITAL_BASED_OUTPATIENT_CLINIC_OR_DEPARTMENT_OTHER)
Admission: EM | Admit: 2018-10-20 | Discharge: 2018-10-20 | Disposition: A | Discharge status: Home / Self Care | Payer: Self-pay | Attending: Emergency Medicine | Admitting: Emergency Medicine

## 2018-10-20 DIAGNOSIS — R1084 Generalized abdominal pain: Secondary | ICD-10-CM

## 2018-10-20 DIAGNOSIS — R197 Diarrhea, unspecified: Secondary | ICD-10-CM

## 2018-10-20 MED ORDER — ONDANSETRON 4 MG PO TBDP
4.0000 mg | ORAL_TABLET | Freq: Once | ORAL | Status: AC
  Administered 2018-10-20: 4 mg via ORAL

## 2018-10-20 MED ORDER — LOPERAMIDE HCL 2 MG PO CAPS
4.0000 mg | ORAL_CAPSULE | Freq: Once | ORAL | Status: AC
  Administered 2018-10-20: 01:00:00 4 mg via ORAL

## 2018-10-20 MED ORDER — ONDANSETRON 4 MG PO TBDP: 4.0000 mg | ORAL_TABLET | Freq: Three times a day (TID) | ORAL | 0 refills | Status: DC | PRN

## 2018-10-20 MED ORDER — LOPERAMIDE HCL 2 MG PO CAPS
ORAL_CAPSULE | ORAL | Status: AC
  Filled 2018-10-20: qty 1

## 2018-10-20 MED ORDER — LOPERAMIDE HCL 2 MG PO CAPS: 2.0000 mg | ORAL_CAPSULE | Freq: Four times a day (QID) | ORAL | 0 refills | Status: DC | PRN

## 2018-10-20 MED ORDER — ONDANSETRON 4 MG PO TBDP
ORAL_TABLET | ORAL | Status: AC
  Filled 2018-10-20: qty 1

## 2018-10-20 NOTE — ED Notes (Signed)
PT asleep in room, appears drowsy. Denies nausea and states pain is better.

## 2018-10-20 NOTE — ED Notes (Signed)
ED Provider at bedside. 

## 2018-10-20 NOTE — ED Notes (Signed)
Pt asleep in room, no emesis or diarrhea since arrival. PO gingerale given. Denies pain.

## 2018-10-20 NOTE — ED Provider Notes (Signed)
[facility] EMERGENCY DEPARTMENT Provider Note   CSN: [id number] Arrival date & time: 10/19/18  2151     History   Chief Complaint Chief Complaint  Patient presents with   Abdominal Pain    HPI Susan Sexton is a 33 y.o. female.     HPI  This is a 33 year old female who presents with abdominal pain and diarrhea.  Patient reports onset of symptoms yesterday.  She reports crampy abdominal pain that comes and goes.  Currently she states that she does not have significant pain.  She has had multiple loose bowel movements.  No bloody stools.  Denies vomiting.  Does report some nausea.  No fevers or sick contacts.  Denies urinary symptoms.  Patient has not taken anything for her symptoms. Past Medical History:  Diagnosis Date   Asthma    High cholesterol     There are no active problems to display for this patient.   Past Surgical History:  Procedure Laterality Date   BREAST SURGERY       OB History   No obstetric history on file.      Home Medications    Prior to Admission medications   Medication Sig Start Date End Date Taking? Authorizing Provider  ALBUTEROL IN Inhale into the lungs.   Yes [provider]  cephALEXin (KEFLEX) 500 MG capsule Take 1 capsule (500 mg total) by mouth 3 (three) times daily. 07/22/16   Kirichenko, Lemont Fillersatyana, PA-C  loperamide (IMODIUM) 2 MG capsule Take 1 capsule (2 mg total) by mouth 4 (four) times daily as needed for diarrhea or loose stools. 10/20/18   Kamaiya Antilla, Mayer Maskerourtney F, MD  metroNIDAZOLE (FLAGYL) 500 MG tablet Take 1 tablet (500 mg total) by mouth 2 (two) times daily. 07/22/16   Kirichenko, Tatyana, PA-C  ondansetron (ZOFRAN ODT) 4 MG disintegrating tablet Take 1 tablet (4 mg total) by mouth every 8 (eight) hours as needed. 10/20/18   Zain Bingman, Mayer Maskerourtney F, MD  phenazopyridine (PYRIDIUM) 200 MG tablet Take 1 tablet (200 mg total) by mouth 3 (three) times daily. 07/22/16   Jaynie CrumbleKirichenko, Tatyana, PA-C    Family History No  family history on file.  Social History Social History   Tobacco Use   Smoking status: Current Every Day Smoker    Packs/day: 0.50    Types: Cigarettes   Smokeless tobacco: Never Used  Substance Use Topics   Alcohol use: No   Drug use: No     Allergies   Patient has no known allergies.   Review of Systems Review of Systems  Constitutional: Negative for chills and fever.  Respiratory: Negative for shortness of breath.   Cardiovascular: Negative for chest pain.  Gastrointestinal: Positive for abdominal pain, diarrhea and nausea. Negative for blood in stool, constipation and vomiting.  Genitourinary: Negative for dysuria.  All other systems reviewed and are negative.    Physical Exam Updated Vital Signs BP 110/68 (BP Location: Right Arm)    Pulse 80    Temp 97.9 F (36.6 C) (Oral)    Resp 16    Ht 1.676 m (5\' 6" )    Wt 68 kg    LMP 09/18/2018    SpO2 99%    BMI 24.21 kg/m   Physical Exam Vitals signs and nursing note reviewed.  Constitutional:      Appearance: She is well-developed. She is not ill-appearing.  HENT:     Head: Normocephalic and atraumatic.     Mouth/Throat:     Mouth: Mucous membranes  are moist.  Neck:     Musculoskeletal: Neck supple.  Cardiovascular:     Rate and Rhythm: Normal rate and regular rhythm.     Heart sounds: Normal heart sounds.  Pulmonary:     Effort: Pulmonary effort is normal. No respiratory distress.     Breath sounds: No wheezing.  Abdominal:     General: Bowel sounds are normal.     Palpations: Abdomen is soft.     Tenderness: There is no abdominal tenderness.  Skin:    General: Skin is warm and dry.  Neurological:     Mental Status: She is alert and oriented to person, place, and time.  Psychiatric:        Mood and Affect: Mood normal.      ED Treatments / Results  Labs (all labs ordered are listed, but only abnormal results are displayed) Labs Reviewed  URINALYSIS, ROUTINE W REFLEX MICROSCOPIC - Abnormal;  Notable for the following components:      Result Value   APPearance HAZY (*)    Specific Gravity, Urine >1.030 (*)    All other components within normal limits  PREGNANCY, URINE    EKG None  Radiology No results found.  Procedures Procedures (including critical care time)  Medications Ordered in ED Medications  ondansetron (ZOFRAN-ODT) disintegrating tablet 4 mg (4 mg Oral Given 10/20/18 0109)  loperamide (IMODIUM) capsule 4 mg (4 mg Oral Given 10/20/18 0110)     Initial Impression / Assessment and Plan / ED Course  I have reviewed the triage vital signs and the nursing notes.  Pertinent labs & imaging results that were available during my care of the patient were reviewed by me and considered in my medical decision making (see chart for details).        Patient presents with crampy abdominal pain and diarrhea.  She is overall nontoxic-appearing and vital signs are reassuring.  Abdominal exam is benign.  She has not taken anything for her symptoms.  She appears well-hydrated.  Urinalysis and urine pregnancy reviewed and reassuring.  Patient was given Imodium and Zofran.  She was able to orally hydrate.  Suspect viral etiology.  Low suspicion for intra-abdominal pathology at this time.  Recommend supportive measures at home.  After history, exam, and medical workup I feel the patient has been appropriately medically screened and is safe for discharge home. Pertinent diagnoses were discussed with the patient. Patient was given return precautions.   Final Clinical Impressions(s) / ED Diagnoses   Final diagnoses:  Diarrhea, unspecified type  Generalized abdominal pain    ED Discharge Orders         Ordered    loperamide (IMODIUM) 2 MG capsule  4 times daily PRN     10/20/18 0214    ondansetron (ZOFRAN ODT) 4 MG disintegrating tablet  Every 8 hours PRN     10/20/18 0214           Merryl Hacker, MD 10/20/18 [phone]

## 2018-10-20 NOTE — Discharge Instructions (Addendum)
You were seen today for diarrhea and abdominal pain.  Take Imodium and Zofran as needed.  If you develop worsening symptoms, fevers, any new or worsening symptoms you should be reevaluated.

## 2020-05-04 ENCOUNTER — Encounter (HOSPITAL_BASED_OUTPATIENT_CLINIC_OR_DEPARTMENT_OTHER): Payer: Self-pay

## 2020-05-04 ENCOUNTER — Inpatient Hospital Stay (HOSPITAL_BASED_OUTPATIENT_CLINIC_OR_DEPARTMENT_OTHER)
Admission: EM | Admit: 2020-05-04 | Discharge: 2020-05-06 | DRG: 247 | Disposition: A | Discharge status: Home / Self Care | Payer: Self-pay | Attending: Cardiology | Admitting: Cardiology

## 2020-05-04 ENCOUNTER — Other Ambulatory Visit: Payer: Self-pay

## 2020-05-04 ENCOUNTER — Emergency Department (HOSPITAL_BASED_OUTPATIENT_CLINIC_OR_DEPARTMENT_OTHER): Payer: Self-pay

## 2020-05-04 DIAGNOSIS — E7849 Other hyperlipidemia: Secondary | ICD-10-CM | POA: Diagnosis present

## 2020-05-04 DIAGNOSIS — E7800 Pure hypercholesterolemia, unspecified: Secondary | ICD-10-CM | POA: Diagnosis present

## 2020-05-04 DIAGNOSIS — I214 Non-ST elevation (NSTEMI) myocardial infarction: Principal | ICD-10-CM | POA: Diagnosis present

## 2020-05-04 DIAGNOSIS — Z79899 Other long term (current) drug therapy: Secondary | ICD-10-CM

## 2020-05-04 DIAGNOSIS — Z8249 Family history of ischemic heart disease and other diseases of the circulatory system: Secondary | ICD-10-CM

## 2020-05-04 DIAGNOSIS — I2510 Atherosclerotic heart disease of native coronary artery without angina pectoris: Secondary | ICD-10-CM

## 2020-05-04 DIAGNOSIS — J45909 Unspecified asthma, uncomplicated: Secondary | ICD-10-CM | POA: Diagnosis present

## 2020-05-04 DIAGNOSIS — Z20822 Contact with and (suspected) exposure to covid-19: Secondary | ICD-10-CM | POA: Diagnosis present

## 2020-05-04 DIAGNOSIS — Z955 Presence of coronary angioplasty implant and graft: Secondary | ICD-10-CM

## 2020-05-04 DIAGNOSIS — F17210 Nicotine dependence, cigarettes, uncomplicated: Secondary | ICD-10-CM | POA: Diagnosis present

## 2020-05-04 DIAGNOSIS — Z83438 Family history of other disorder of lipoprotein metabolism and other lipidemia: Secondary | ICD-10-CM

## 2020-05-04 DIAGNOSIS — E785 Hyperlipidemia, unspecified: Secondary | ICD-10-CM | POA: Diagnosis present

## 2020-05-04 DIAGNOSIS — Z720 Tobacco use: Secondary | ICD-10-CM | POA: Diagnosis present

## 2020-05-04 DIAGNOSIS — Z8616 Personal history of COVID-19: Secondary | ICD-10-CM

## 2020-05-04 DIAGNOSIS — I071 Rheumatic tricuspid insufficiency: Secondary | ICD-10-CM | POA: Diagnosis present

## 2020-05-04 DIAGNOSIS — N920 Excessive and frequent menstruation with regular cycle: Secondary | ICD-10-CM | POA: Diagnosis present

## 2020-05-04 HISTORY — DX: Non-ST elevation (NSTEMI) myocardial infarction: I21.4

## 2020-05-04 HISTORY — DX: Hyperlipidemia, unspecified: E78.5

## 2020-05-04 HISTORY — DX: Atherosclerotic heart disease of native coronary artery without angina pectoris: I25.10

## 2020-05-04 LAB — CBC WITH DIFFERENTIAL/PLATELET
Abs Immature Granulocytes: 0.03 10*3/uL (ref 0.00–0.07)
Basophils Absolute: 0 10*3/uL (ref 0.0–0.1)
Basophils Relative: 0 %
Eosinophils Absolute: 0.1 10*3/uL (ref 0.0–0.5)
Eosinophils Relative: 1 %
HCT: 36.5 % (ref 36.0–46.0)
Hemoglobin: 12.3 g/dL (ref 12.0–15.0)
Immature Granulocytes: 0 %
Lymphocytes Relative: 23 %
Lymphs Abs: 2.8 10*3/uL (ref 0.7–4.0)
MCH: 31.9 pg (ref 26.0–34.0)
MCHC: 33.7 g/dL (ref 30.0–36.0)
MCV: 94.8 fL (ref 80.0–100.0)
Monocytes Absolute: 0.7 10*3/uL (ref 0.1–1.0)
Monocytes Relative: 5 %
Neutro Abs: 8.5 10*3/uL — ABNORMAL HIGH (ref 1.7–7.7)
Neutrophils Relative %: 71 %
Platelets: 259 10*3/uL (ref 150–400)
RBC: 3.85 MIL/uL — ABNORMAL LOW (ref 3.87–5.11)
RDW: 13.3 % (ref 11.5–15.5)
WBC: 12.1 10*3/uL — ABNORMAL HIGH (ref 4.0–10.5)
nRBC: 0 % (ref 0.0–0.2)

## 2020-05-04 LAB — PROTIME-INR
INR: 1 (ref 0.8–1.2)
Prothrombin Time: 13 seconds (ref 11.4–15.2)

## 2020-05-04 LAB — TROPONIN I (HIGH SENSITIVITY)
Troponin I (High Sensitivity): 142 ng/L (ref <18)
Troponin I (High Sensitivity): 156 ng/L (ref <18)
Troponin I (High Sensitivity): 156 ng/L (ref <18)

## 2020-05-04 LAB — BASIC METABOLIC PANEL
Anion gap: 9 (ref 5–15)
BUN: 15 mg/dL (ref 6–20)
CO2: 26 mmol/L (ref 22–32)
Calcium: 9.7 mg/dL (ref 8.9–10.3)
Chloride: 102 mmol/L (ref 98–111)
Creatinine, Ser: 0.7 mg/dL (ref 0.44–1.00)
GFR, Estimated: >60 mL/min (ref >60)
Glucose, Bld: 82 mg/dL (ref 70–99)
Potassium: 3.5 mmol/L (ref 3.5–5.1)
Sodium: 137 mmol/L (ref 135–145)

## 2020-05-04 LAB — APTT: aPTT: 30 seconds (ref 24–36)

## 2020-05-04 LAB — RESP PANEL BY RT-PCR (FLU A&B, COVID) ARPGX2
Influenza A by PCR: NEGATIVE
Influenza B by PCR: NEGATIVE
SARS Coronavirus 2 by RT PCR: NEGATIVE

## 2020-05-04 LAB — HEPARIN LEVEL (UNFRACTIONATED)
Heparin Unfractionated: <0.1 IU/mL — ABNORMAL LOW (ref 0.30–0.70)
Heparin Unfractionated: 0.32 IU/mL (ref 0.30–0.70)

## 2020-05-04 LAB — HCG, SERUM, QUALITATIVE: Preg, Serum: NEGATIVE

## 2020-05-04 LAB — HIV ANTIBODY (ROUTINE TESTING W REFLEX): HIV Screen 4th Generation wRfx: NONREACTIVE

## 2020-05-04 LAB — TSH: TSH: 4.381 u[IU]/mL (ref 0.350–4.500)

## 2020-05-04 LAB — D-DIMER, QUANTITATIVE: D-Dimer, Quant: 0.33 ug/mL-FEU (ref 0.00–0.50)

## 2020-05-04 MED ORDER — ZOLPIDEM TARTRATE 5 MG PO TABS
5.0000 mg | ORAL_TABLET | Freq: Every evening | ORAL | Status: DC | PRN
  Administered 2020-05-05: 5 mg via ORAL
  Filled 2020-05-04: qty 1

## 2020-05-04 MED ORDER — SODIUM CHLORIDE 0.9% FLUSH
3.0000 mL | Freq: Two times a day (BID) | INTRAVENOUS | Status: DC
  Administered 2020-05-04 – 2020-05-06 (×3): 3 mL via INTRAVENOUS

## 2020-05-04 MED ORDER — SODIUM CHLORIDE 0.9% FLUSH: 3.0000 mL | INTRAVENOUS | Status: DC | PRN

## 2020-05-04 MED ORDER — SODIUM CHLORIDE 0.9 % IV SOLN: 250.0000 mL | INTRAVENOUS | Status: DC | PRN

## 2020-05-04 MED ORDER — SODIUM CHLORIDE 0.9 % WEIGHT BASED INFUSION
3.0000 mL/kg/h | INTRAVENOUS | Status: DC
Start: 2020-05-05 — End: 2020-05-05
  Administered 2020-05-05: 3 mL/kg/h via INTRAVENOUS

## 2020-05-04 MED ORDER — METOCLOPRAMIDE HCL 5 MG/ML IJ SOLN
10.0000 mg | Freq: Once | INTRAMUSCULAR | Status: AC
  Administered 2020-05-04: 10 mg via INTRAVENOUS
  Filled 2020-05-04: qty 2

## 2020-05-04 MED ORDER — ACETAMINOPHEN 325 MG PO TABS
650.0000 mg | ORAL_TABLET | ORAL | Status: DC | PRN
  Administered 2020-05-05: 650 mg via ORAL
  Filled 2020-05-04: qty 2

## 2020-05-04 MED ORDER — NITROGLYCERIN 0.4 MG SL SUBL
0.4000 mg | SUBLINGUAL_TABLET | SUBLINGUAL | Status: DC | PRN
  Administered 2020-05-04 (×3): 0.4 mg via SUBLINGUAL
  Filled 2020-05-04: qty 1

## 2020-05-04 MED ORDER — ASPIRIN 81 MG PO CHEW
324.0000 mg | CHEWABLE_TABLET | Freq: Once | ORAL | Status: AC
  Administered 2020-05-04: 324 mg via ORAL
  Filled 2020-05-04: qty 4

## 2020-05-04 MED ORDER — ATORVASTATIN CALCIUM 80 MG PO TABS
80.0000 mg | ORAL_TABLET | Freq: Every day | ORAL | Status: DC
  Administered 2020-05-04 – 2020-05-06 (×3): 80 mg via ORAL
  Filled 2020-05-04 (×3): qty 1

## 2020-05-04 MED ORDER — HEPARIN BOLUS VIA INFUSION
4000.0000 [IU] | Freq: Once | INTRAVENOUS | Status: AC
  Administered 2020-05-04: 4000 [IU] via INTRAVENOUS

## 2020-05-04 MED ORDER — ASPIRIN 81 MG PO CHEW
81.0000 mg | CHEWABLE_TABLET | ORAL | Status: AC
Start: 2020-05-05 — End: 2020-05-05
  Administered 2020-05-05: 81 mg via ORAL
  Filled 2020-05-04 (×2): qty 1

## 2020-05-04 MED ORDER — SODIUM CHLORIDE 0.9% FLUSH
3.0000 mL | Freq: Two times a day (BID) | INTRAVENOUS | Status: DC
  Administered 2020-05-04 – 2020-05-05 (×2): 3 mL via INTRAVENOUS

## 2020-05-04 MED ORDER — SODIUM CHLORIDE 0.9 % WEIGHT BASED INFUSION
1.0000 mL/kg/h | INTRAVENOUS | Status: DC
Start: 2020-05-05 — End: 2020-05-05

## 2020-05-04 MED ORDER — SODIUM CHLORIDE 0.9 % IV SOLN: INTRAVENOUS | Status: DC | PRN

## 2020-05-04 MED ORDER — ONDANSETRON HCL 4 MG/2ML IJ SOLN: 4.0000 mg | Freq: Four times a day (QID) | INTRAMUSCULAR | Status: DC | PRN

## 2020-05-04 MED ORDER — NITROGLYCERIN 0.4 MG SL SUBL: 0.4000 mg | SUBLINGUAL_TABLET | SUBLINGUAL | Status: DC | PRN

## 2020-05-04 MED ORDER — ALPRAZOLAM 0.25 MG PO TABS: 0.2500 mg | ORAL_TABLET | Freq: Two times a day (BID) | ORAL | Status: DC | PRN

## 2020-05-04 MED ORDER — HEPARIN (PORCINE) 25000 UT/250ML-% IV SOLN
900.0000 [IU]/h | INTRAVENOUS | Status: DC
  Administered 2020-05-04: 800 [IU]/h via INTRAVENOUS
  Filled 2020-05-04: qty 250

## 2020-05-04 NOTE — H&P (Addendum)
Cardiology Admission History and Physical:   Patient ID: ORLEAN HOLTROP; MRN: [id number]; DOB: [birth year]   Admission date: 05/04/2020  Primary Care Provider: System, Provider Not In Primary Cardiologist: No primary care provider on file. New Primary Electrophysiologist: None    Chief Complaint:  NSTEMI  Patient Profile:   ELLE VEZINA is a 35 y.o. female with a history of HLD, asthma, tob use, HA, FH CAD.   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  She was doing some housework that was fairly strenuous by description, moving some furniture around in order to clean.  With this exertion, she had substernal chest pain that she describes as a pressure.  It reached a 7/10.  She was a little short of breath, but did not have nausea, vomiting, or diaphoresis.  Chest pain resolved with rest.  She had never had this pain before and has not had it since.  The next day, she had some lower extremity edema and just did not feel right.  Today, she went to work but was not feeling well.  She ended up coming to med [facility] where her troponin was elevated.  She was transferred to [facility] ER and is currently resting comfortably.    Past Medical History:  Diagnosis Date  . Asthma   . High cholesterol     Past Surgical History:  Procedure Laterality Date  . BREAST LUMPECTOMY Left 06/6/219  . INCISION AND DRAINAGE BREAST ABSCESS Left 09/2017     Medications Prior to Admission: Prior to Admission medications   Medication Sig Start Date End Date Taking? Authorizing Provider  albuterol (VENTOLIN HFA) 108 (90 Base) MCG/ACT inhaler Inhale 2 puffs into the lungs every 6 (six) hours as needed for shortness of breath. 09/17/15  Yes [provider]  atorvastatin (LIPITOR) 20 MG tablet Take 20 mg by mouth daily.   Yes [provider]  ibuprofen (ADVIL) 400 MG tablet Take 400 mg by mouth every 6 (six) hours as needed for mild pain.   Yes  [provider]  Multiple Vitamins-Minerals (MULTI ADULT GUMMIES PO) Take 1 tablet by mouth daily.   Yes [provider]  cephALEXin (KEFLEX) 500 MG capsule Take 1 capsule (500 mg total) by mouth 3 (three) times daily. Patient not taking: Reported on 05/04/2020 07/22/16   Jaynie Crumble, PA-C  loperamide (IMODIUM) 2 MG capsule Take 1 capsule (2 mg total) by mouth 4 (four) times daily as needed for diarrhea or loose stools. Patient not taking: Reported on 05/04/2020 10/20/18   Horton, Mayer Masker, MD  metroNIDAZOLE (FLAGYL) 500 MG tablet Take 1 tablet (500 mg total) by mouth 2 (two) times daily. Patient not taking: Reported on 05/04/2020 07/22/16   Jaynie Crumble, PA-C  ondansetron (ZOFRAN ODT) 4 MG disintegrating tablet Take 1 tablet (4 mg total) by mouth every 8 (eight) hours as needed. Patient not taking: Reported on 05/04/2020 10/20/18   Horton, Mayer Masker, MD  phenazopyridine (PYRIDIUM) 200 MG tablet Take 1 tablet (200 mg total) by mouth 3 (three) times daily. Patient not taking: Reported on 05/04/2020 07/22/16   Jaynie Crumble, PA-C     Allergies:   No Known Allergies  Social History:   Social History   Socioeconomic History  . Marital status: Single    Spouse name: Not on file  . Number of children: Not on file  . Years of education: Not on file  . Highest education level: Not  on file  Occupational History  . Not on file  Tobacco Use  . Smoking status: Current Every Day Smoker    Packs/day: 0.50    Types: Cigarettes  . Smokeless tobacco: Never Used  Substance and Sexual Activity  . Alcohol use: No  . Drug use: No  . Sexual activity: Not on file  Other Topics Concern  . Not on file  Social History Narrative  . Not on file   Social Determinants of Health   Financial Resource Strain: Not on file  Food Insecurity: Not on file  Transportation Needs: Not on file  Physical Activity: Not on file  Stress: Not on file  Social Connections: Not on file   Intimate Partner Violence: Not on file    Family History:  The patient's family history includes Heart attack (age of onset: 36) in her father; Hypertension in her mother.   The patient She indicated that her mother is alive. She indicated that her father is deceased.    ROS:  Please see the history of present illness.  All other ROS reviewed and negative.     Physical Exam/Data:   Vitals:   05/04/20 1530 05/04/20 1541 05/04/20 1545 05/04/20 1715  BP: 135/81 128/86  108/80  Pulse: 73  75 71  Resp: 18  18 13   Temp:      TempSrc:      SpO2: 100%  100% 100%  Weight:      Height:       No intake or output data in the 24 hours ending 05/04/20 1827 Filed Weights   05/04/20 1324  Weight: 68 kg   Body mass index is 23.49 kg/m.  General:  Well nourished, well developed, female in no acute distress HEENT: normal Lymph: no adenopathy Neck:  JVD not elevated Endocrine:  No thryomegaly Vascular: No carotid bruits; 4/4 extremity pulses 2+ bilaterally  Cardiac:  normal S1, S2; RRR; no murmur, no rub or gallop  Lungs:  clear to auscultation bilaterally, no wheezing, rhonchi or rales  Abd: soft, nontender, no hepatomegaly  Ext: No edema Musculoskeletal:  No deformities, BUE and BLE strength normal and equal Skin: warm and dry  Neuro:  CNs 2-12 intact, no focal abnormalities noted Psych:  Normal affect    EKG:  The ECG was personally reviewed: SR, HR 70, no acute ischemic changes Telemetry: SR  Relevant CV Studies:  None  Laboratory Data:  Chemistry Recent Labs  Lab 05/04/20 1354  NA 137  K 3.5  CL 102  CO2 26  GLUCOSE 82  BUN 15  CREATININE 0.70  CALCIUM 9.7  GFRNONAA >60  ANIONGAP 9    No results for input(s): PROT, ALBUMIN, AST, ALT, ALKPHOS, BILITOT in the last 168 hours. Hematology Recent Labs  Lab 05/04/20 1354  WBC 12.1*  RBC 3.85*  HGB 12.3  HCT 36.5  MCV 94.8  MCH 31.9  MCHC 33.7  RDW 13.3  PLT 259   Cardiac Enzymes  High Sensitivity  Troponin:   Recent Labs  Lab 05/04/20 1354  TROPONINIHS 142*     BNPNo results for input(s): BNP, PROBNP in the last 168 hours.  DDimer  Recent Labs  Lab 05/04/20 1354  DDIMER 0.33   Lipids: 01/19/2020 Triglycerides 10 - 149 mg/dl 73    Cholesterol 13/12/2019 mg/dl <456  **Reference ranges adopted from [facility] Cholesterol Education Program Total Cholesterol:      Normal: <200     Borderline High: 200-239     High: >240  LDL Calculated 0 - 100 mg/dL [id number]  LDL Reference range(for fasting or directly measured LDL)*:  Optimal:<100 mg/dl  Near or above ZHYQMVH:846-962 mg/dl  Borderline XBMW:413-244 mg/dl  WNUU:725-366 mg/dl    HDL 44.0 - 34.7 mg/dl 63.2High  HDL Reference Range:  High Risk:<40 mg/dl  Low QQVZ:>56 mg/dl  VLDL Calculated 6 - 40 mg/dL 15       INR:  Lab Results  Component Value Date   INR 1.0 05/04/2020   A1c: No results found for: HGBA1C Thyroid: No results found for: TSH, T3TOTAL, T4TOTAL, THYROIDAB  Radiology/Studies:  DG Chest Portable 1 View  Result Date: 05/04/2020 CLINICAL DATA:  Chest pains, SOB, chest tightness for a few days. EXAM: PORTABLE CHEST 1 VIEW COMPARISON:  Chest radiograph 09/17/2015 FINDINGS: The cardiomediastinal contours are within normal limits. The lungs are clear. No pneumothorax or pleural effusion. No acute finding in the visualized skeleton. IMPRESSION: No active disease. Electronically Signed   By: Emmaline Kluver M.D.   On: 05/04/2020 14:36    Assessment and Plan:   1.  NSTEMI -Symptoms started with exertion and relieved by rest -Initial troponin was elevated, repeat is pending -Check TSH, recheck lipids, continue heparin -With very strong family history of premature coronary artery disease (father with CABG in 30s) likely familial hyperlipidemia (LDL 282) and tobacco use, suspect cardiac catheterization is the best option. - Cardiac catheterization was discussed with the patient fully. The  patient understands that risks include but are not limited to stroke (1 in 1000), death (1 in 1000), kidney failure [usually temporary] (1 in 500), bleeding (1 in 200), allergic reaction [possibly serious] (1 in 200).  The patient understands and is willing to proceed.   -Check a urine pregnancy  2.  Hyperlipidemia, likely familial hyperlipidemia -Check a lipid profile, she prior to admission she was taking Lipitor 20 mg daily. -May need referral to the lipid clinic  3.  Tobacco use -Cessation encouraged   Active Problems:   * No active hospital problems. *     For questions or updates, please contact [facility] Please consult [email or web address] for contact info under Cardiology/STEMI.    Melida Quitter, PA-C  05/04/2020 6:27 PM    Patient seen and examined. Agree with above documentation. Ms. Morabito is a 35 year old female with a history of hyperlipidemia, tobacco use, family history CAD who presents with chest pain. She reports that she had 1st episode of chest pain 2 days ago. States that she was cooking when she had the onset of substernal chest tightness. Lasted 10 minutes and resolved. Subsequently yesterday had a 2nd episode of chest pain. Reports that she was moving furniture and began to have substernal chest tightness. Lasted 10 to 15 minutes and resolved. She has not had further chest pain, but given the episode [facility].  In the ED, initial vital signs notable for BP 126/86, pulse 79, SpO2 100% on room air.  Labs notable for creatinine 0.7, hemoglobin 12.3, platelets 259, troponin 142 > 156.  On exam, patient is alert and oriented, regular rate and rhythm, no murmurs, lungs CTAB, no LE edema or JVD.  She presents with chest pain and mild troponin elevation.  Despite her age, she has significant risk factors for CAD (marked hyperlipidemia with LDL 282, significant family history with her father having CABG in his 52s, tobacco use).   Suspect she likely has FH given her family history/significantly elevated LDL.  Will treat as NSTEMI, start on  heparin drip, aspirin.  We will start atorvastatin 80 mg daily.  Plan for heart catheterization tomorrow.  Check echocardiogram.  Risks and benefits of cardiac catheterization have been discussed with the patient.  These include bleeding, infection, kidney damage, stroke, heart attack, death.  The patient understands these risks and is willing to proceed.   Little Ishikawahristopher L Paxton Kanaan, MD

## 2020-05-04 NOTE — Progress Notes (Signed)
ANTICOAGULATION CONSULT NOTE - Initial Consult  Pharmacy Consult for heparin Indication: chest pain/ACS  No Known Allergies  Patient Measurements: Height: 5\' 7"  (170.2 cm) Weight: 68 kg (150 lb) IBW/kg (Calculated) : 61.6 Heparin Dosing Weight: 68  Vital Signs: Temp: 98 F (36.7 C) (02/24 1330) Temp Source: Oral (02/24 1330) BP: 121/93 (02/24 1454) Pulse Rate: 72 (02/24 1454)  Labs: Recent Labs    05/04/20 1354  HGB 12.3  HCT 36.5  PLT 259  CREATININE 0.70  TROPONINIHS 142*    Estimated Creatinine Clearance: 96.4 mL/min (by C-G formula based on SCr of 0.7 mg/dL).   Medical History: Past Medical History:  Diagnosis Date  . Asthma   . High cholesterol     Medications:  (Not in a hospital admission)   Assessment: new onset CP, with positive troponin   Goal of Therapy:  Heparin level 0.3-0.7 units/ml Monitor platelets by anticoagulation protocol: Yes   Plan:  Give 4000 units bolus x 1 Start heparin infusion at 800 units/hr Check anti-Xa level in 6 hours and daily while on heparin Continue to monitor H&H and platelets  05/06/20 A Masiel Gentzler 05/04/2020,3:08 PM

## 2020-05-04 NOTE — ED Notes (Signed)
[facility] at bedside 

## 2020-05-04 NOTE — ED Notes (Signed)
Pt via pov from work with chest pain x 2 days. Pt states she has asthma and has had to use her inhaler once since this started. Denies n/v. Pt alert & oriented, NAD noted.

## 2020-05-04 NOTE — Progress Notes (Signed)
ANTICOAGULATION CONSULT NOTE - Initial Consult  Pharmacy Consult for heparin Indication: chest pain/ACS  No Known Allergies  Patient Measurements: Height: 5\' 7"  (170.2 cm) Weight: 65.3 kg (143 lb 14.4 oz) IBW/kg (Calculated) : 61.6 Heparin Dosing Weight: 68  Vital Signs: Temp: 98.6 F (37 C) (02/24 2044) Temp Source: Oral (02/24 2044) BP: 111/69 (02/24 2044) Pulse Rate: 71 (02/24 2044)  Labs: Recent Labs    05/04/20 1354 05/04/20 1510 05/04/20 1720 05/04/20 2126  HGB 12.3  --   --   --   HCT 36.5  --   --   --   PLT 259  --   --   --   APTT 30  --   --   --   LABPROT 13.0  --   --   --   INR 1.0  --   --   --   HEPARINUNFRC  --  <0.10*  --  0.32  CREATININE 0.70  --   --   --   TROPONINIHS 142*  --  156*  --     Estimated Creatinine Clearance: 96.4 mL/min (by C-G formula based on SCr of 0.7 mg/dL).  Assessment: new onset CP, with positive troponin  Hep lvl within goal 0.32  Goal of Therapy:  Heparin level 0.3-0.7 units/ml Monitor platelets by anticoagulation protocol: Yes   Plan:  Continue heparin 800 units/hr Daily hep lvl cbc  2127, PharmD, BCCCP Clinical Pharmacist [phone]  Please check AMION for all [facility] Pharmacy numbers  05/04/2020 10:04 PM

## 2020-05-04 NOTE — ED Triage Notes (Addendum)
Delay in triage/EKG due to pt checked in then went to vending machine-pt c/o chest tightness x 2 days-denies fever/flu sx-NAD-steady gait

## 2020-05-04 NOTE — ED Provider Notes (Signed)
[facility] EMERGENCY DEPARTMENT Provider Note   CSN: [id number] Arrival date & time: 05/04/20  1312     History Chief Complaint  Patient presents with  . Chest Pain    Susan Sexton is a 35 y.o. female with a history of asthma, high cholesterol, smoking, present emerge department chest pain.  She reports tightness sensation in her epigastrium and mid chest that began 3 days ago gradually.  It has been intermittent but never really going away for the past 3 days.  It may be worse with exertion and walking, although she is unsure.  She has not had this feeling before.  Does not radiate anywhere.  It is currently mild to moderate intensity.  She says it does not feel like her asthma although she has used her inhaler.  She reports she has chronic headaches, no new headache.  She denies sore throat or congestion.  She reports that she had Covid for the second time approximately 1 month ago.  She has not had the covid vaccines.  Sig family hx of MI at young age in mother (105), father, and uncles.  She smokes 1 ppd. She takes atorvastatin for high cholesterol  Unsure about diabetes.  Denies hx of HTN.  Reports heavy menstrual cycles and thinks she may have hx of anemia  No hemoptysis or asymmetric LE edema. Patient denies personal or family history of DVT or PE. No recent hormone use (including OCP); travel for >6 hours; prolonged immobilization for greater than 3 days; surgeries or trauma in the last 4 weeks; or malignancy with treatment within 6 months.   HPI     Past Medical History:  Diagnosis Date  . Asthma   . High cholesterol     There are no problems to display for this patient.   Past Surgical History:  Procedure Laterality Date  . BREAST LUMPECTOMY    . BREAST SURGERY       OB History   No obstetric history on file.     No family history on file.  Social History   Tobacco Use  . Smoking status: Current Every Day Smoker    Packs/day: 0.50     Types: Cigarettes  . Smokeless tobacco: Never Used  Substance Use Topics  . Alcohol use: No  . Drug use: No    Home Medications Prior to Admission medications   Medication Sig Start Date End Date Taking? Authorizing Provider  ALBUTEROL IN Inhale into the lungs.    [provider]  cephALEXin (KEFLEX) 500 MG capsule Take 1 capsule (500 mg total) by mouth 3 (three) times daily. 07/22/16   Kirichenko, Lemont Fillers, PA-C  loperamide (IMODIUM) 2 MG capsule Take 1 capsule (2 mg total) by mouth 4 (four) times daily as needed for diarrhea or loose stools. 10/20/18   Horton, Mayer Masker, MD  metroNIDAZOLE (FLAGYL) 500 MG tablet Take 1 tablet (500 mg total) by mouth 2 (two) times daily. 07/22/16   Kirichenko, Tatyana, PA-C  ondansetron (ZOFRAN ODT) 4 MG disintegrating tablet Take 1 tablet (4 mg total) by mouth every 8 (eight) hours as needed. 10/20/18   Horton, Mayer Masker, MD  phenazopyridine (PYRIDIUM) 200 MG tablet Take 1 tablet (200 mg total) by mouth 3 (three) times daily. 07/22/16   Jaynie Crumble, PA-C    Allergies    Patient has no known allergies.  Review of Systems   Review of Systems  Constitutional: Negative for chills and fever.  HENT: Negative for ear pain and  sore throat.   Eyes: Negative for pain and visual disturbance.  Respiratory: Positive for shortness of breath. Negative for cough.   Cardiovascular: Positive for chest pain. Negative for palpitations.  Gastrointestinal: Negative for abdominal pain and vomiting.  Genitourinary: Negative for dysuria and hematuria.  Musculoskeletal: Negative for arthralgias and back pain.  Skin: Negative for color change and rash.  Neurological: Negative for syncope.  All other systems reviewed and are negative.   Physical Exam Updated Vital Signs BP 128/86   Pulse 73   Temp 98 F (36.7 C) (Oral)   Resp 18   Ht 5\' 7"  (1.702 m)   Wt 68 kg   LMP 04/25/2020   SpO2 100%   BMI 23.49 kg/m   Physical Exam Constitutional:       General: She is not in acute distress. HENT:     Head: Normocephalic and atraumatic.  Eyes:     Conjunctiva/sclera: Conjunctivae normal.     Pupils: Pupils are equal, round, and reactive to light.  Cardiovascular:     Rate and Rhythm: Normal rate and regular rhythm.     Heart sounds: Normal heart sounds.  Pulmonary:     Effort: Pulmonary effort is normal. No respiratory distress.  Abdominal:     General: There is no distension.     Tenderness: There is no abdominal tenderness.  Skin:    General: Skin is warm and dry.  Neurological:     General: No focal deficit present.     Mental Status: She is alert. Mental status is at baseline.  Psychiatric:        Mood and Affect: Mood normal.        Behavior: Behavior normal.     ED Results / Procedures / Treatments   Labs (all labs ordered are listed, but only abnormal results are displayed) Labs Reviewed  CBC WITH DIFFERENTIAL/PLATELET - Abnormal; Notable for the following components:      Result Value   WBC 12.1 (*)    RBC 3.85 (*)    Neutro Abs 8.5 (*)    All other components within normal limits  TROPONIN I (HIGH SENSITIVITY) - Abnormal; Notable for the following components:   Troponin I (High Sensitivity) 142 (*)    All other components within normal limits  RESP PANEL BY RT-PCR (FLU A&B, COVID) ARPGX2  BASIC METABOLIC PANEL  D-DIMER, QUANTITATIVE  PROTIME-INR  APTT  HEPARIN LEVEL (UNFRACTIONATED)  TROPONIN I (HIGH SENSITIVITY)    EKG EKG Interpretation  Date/Time:  Thursday May 04 2020 13:26:52 EST Ventricular Rate:  91 PR Interval:    QRS Duration: 79 QT Interval:  346 QTC Calculation: 426 R Axis:   43 Text Interpretation: Sinus rhythm Baseline wander in lead(s) V3 No STEMI Confirmed by [id number] [phone]) on 05/04/2020 1:29:37 PM   Radiology DG Chest Portable 1 View  Result Date: 05/04/2020 CLINICAL DATA:  Chest pains, SOB, chest tightness for a few days. EXAM: PORTABLE CHEST 1 VIEW COMPARISON:   Chest radiograph 09/17/2015 FINDINGS: The cardiomediastinal contours are within normal limits. The lungs are clear. No pneumothorax or pleural effusion. No acute finding in the visualized skeleton. IMPRESSION: No active disease. Electronically Signed   By: 11/18/2015 M.D.   On: 05/04/2020 14:36    Procedures .Critical Care Performed by: 05/06/2020, MD Authorized by: Terald Sleeper, MD   Critical care provider statement:    Critical care time (minutes):  45   Critical care was necessary to treat or prevent  imminent or life-threatening deterioration of the following conditions:  Circulatory failure   Critical care was time spent personally by me on the following activities:  Discussions with consultants, evaluation of patient's response to treatment, examination of patient, ordering and performing treatments and interventions, ordering and review of laboratory studies, ordering and review of radiographic studies, pulse oximetry, re-evaluation of patient's condition, obtaining history from patient or surrogate and review of old charts Comments:     NSTEMI, Heparin     Medications Ordered in ED Medications  nitroGLYCERIN (NITROSTAT) SL tablet 0.4 mg (0.4 mg Sublingual Given 05/04/20 1543)  heparin ADULT infusion 100 units/mL (25000 units/242mL) (800 Units/hr Intravenous New Bag/Given 05/04/20 1515)  0.9 %  sodium chloride infusion ( Intravenous New Bag/Given 05/04/20 1512)  aspirin chewable tablet 324 mg (324 mg Oral Given 05/04/20 1508)  heparin bolus via infusion 4,000 Units (4,000 Units Intravenous Bolus from Bag 05/04/20 1516)  metoCLOPramide (REGLAN) injection 10 mg (10 mg Intravenous Given 05/04/20 1543)    ED Course  I have reviewed the triage vital signs and the nursing notes.  Pertinent labs & imaging results that were available during my care of the patient were reviewed by me and considered in my medical decision making (see chart for details).  This patient presents  to the Emergency Department with complaint of chest pain. This involves an extensive number of treatment options, and is a complaint that carries with it a high risk of complications and morbidity.  The differential diagnosis includes ACS vs Pneumothorax vs PE vs Reflux/Gastritis vs MSK pain vs Pneumonia vs anemia vs other.  Several risk factors for MI.  EKG reassuring, but we'll check troponins.  If her workup is negative here today I would still place a referral to cardiology, and I explained to her with her family hx, smoking hx, and medical comorbdities she needs a cardiologist office evaluation.  We'll check ddimer and CT PE if necessary for PE rule out, given her recent hx of Covid.  No other clotting risk factors.  We'll also check for anemia and pneumonia  I ordered, reviewed, and interpreted labs.  Trop elevated a 142.  Will need to trend.  Ddimer negative.  WBC 12.1.   Hgb normal. I ordered medication SL nitro, aspirin, heparin for chest pain and NSTEMI I ordered imaging studies which included dg chest I independently visualized and interpreted imaging which showed no active disease and the monitor tracing which showed NSR I personally reviewed the patients ECG which showed sinus rhythm with no acute ischemic findings  I consulted cardiology and discussed lab and imaging findings  After the interventions stated above, I reevaluated the patient and found that they remained clinically stable.  Plan for [facility] for admission to cardiology service.   Clinical Course as of 05/04/20 1650  Thu May 04, 2020  1419 D-Dimer, Quant: 0.33 [MT]  1500 Trop elevated, pt reports pain free.  Ordered meds for NSTEMI.  Paged cardiology.   [MT]  1528 I spoke to Dr Bjorn Pippin from cardiology who recommends IV heparin and transfer to [facility], uncertain if they will cath her today or consider alternative testing, depending on her troponin trend and her chest pain level.  There are no available  beds at [facility], so we've arranged for Ed to Ed transfer.  Accepting MD is Dr Renold Don.  Patient will be admitted by the cardiology service upon her arrival - please notify their team (Dr Bjorn Pippin accepting) [MT]  1535 She  reported chest pressure to nurses.  Nitro improved CP from 6/10 to 2/10, but she complains of terrible headache.  I've asked for IV reglan. [MT]    Clinical Course User Index [MT] Joylene Wescott, Kermit BaloMatthew J, MD    Final Clinical Impression(s) / ED Diagnoses Final diagnoses:  NSTEMI (non-ST elevated myocardial infarction) [facility](HCC)    Rx / DC Orders ED Discharge Orders    None       Terald Sleeperrifan, Khalise Billard J, MD 05/04/20 1650

## 2020-05-04 NOTE — ED Notes (Signed)
[facility] AT BEDSIDE 

## 2020-05-04 NOTE — ED Notes (Signed)
Verbal order reglan for h/a

## 2020-05-05 ENCOUNTER — Encounter (HOSPITAL_COMMUNITY): Payer: Self-pay | Admitting: Cardiology

## 2020-05-05 ENCOUNTER — Observation Stay (HOSPITAL_COMMUNITY): Payer: Self-pay

## 2020-05-05 ENCOUNTER — Encounter (HOSPITAL_COMMUNITY)
Admission: EM | Disposition: A | Discharge status: Home / Self Care | Payer: Self-pay | Source: Home / Self Care | Attending: Cardiology

## 2020-05-05 DIAGNOSIS — I361 Nonrheumatic tricuspid (valve) insufficiency: Secondary | ICD-10-CM

## 2020-05-05 DIAGNOSIS — R079 Chest pain, unspecified: Secondary | ICD-10-CM

## 2020-05-05 DIAGNOSIS — I2510 Atherosclerotic heart disease of native coronary artery without angina pectoris: Secondary | ICD-10-CM

## 2020-05-05 HISTORY — PX: LEFT HEART CATH AND CORONARY ANGIOGRAPHY: CATH118249

## 2020-05-05 HISTORY — PX: CORONARY STENT INTERVENTION: CATH118234

## 2020-05-05 HISTORY — PX: CORONARY STENT PLACEMENT: SHX1402

## 2020-05-05 LAB — COMPREHENSIVE METABOLIC PANEL
ALT: 19 U/L (ref 0–44)
AST: 21 U/L (ref 15–41)
Albumin: 3.6 g/dL (ref 3.5–5.0)
Alkaline Phosphatase: 70 U/L (ref 38–126)
Anion gap: 10 (ref 5–15)
BUN: 13 mg/dL (ref 6–20)
CO2: 25 mmol/L (ref 22–32)
Calcium: 9.2 mg/dL (ref 8.9–10.3)
Chloride: 104 mmol/L (ref 98–111)
Creatinine, Ser: 0.69 mg/dL (ref 0.44–1.00)
GFR, Estimated: >60 mL/min (ref >60)
Glucose, Bld: 89 mg/dL (ref 70–99)
Potassium: 3.5 mmol/L (ref 3.5–5.1)
Sodium: 139 mmol/L (ref 135–145)
Total Bilirubin: 0.5 mg/dL (ref 0.3–1.2)
Total Protein: 6.5 g/dL (ref 6.5–8.1)

## 2020-05-05 LAB — LIPID PANEL
Cholesterol: 230 mg/dL — ABNORMAL HIGH (ref 0–200)
HDL: 49 mg/dL (ref >40)
LDL Cholesterol: 171 mg/dL — ABNORMAL HIGH (ref 0–99)
Total CHOL/HDL Ratio: 4.7 RATIO
Triglycerides: 52 mg/dL (ref <150)
VLDL: 10 mg/dL (ref 0–40)

## 2020-05-05 LAB — ECHOCARDIOGRAM COMPLETE
AR max vel: 1.89 cm2
AV Area VTI: 2.13 cm2
AV Area mean vel: 2 cm2
AV Mean grad: 4 mmHg
AV Peak grad: 7.7 mmHg
Ao pk vel: 1.39 m/s
Area-P 1/2: 3.27 cm2
Height: 67 in
MV VTI: 2.19 cm2
S' Lateral: 2.4 cm
Weight: 2300.8 oz

## 2020-05-05 LAB — HEPARIN LEVEL (UNFRACTIONATED): Heparin Unfractionated: 0.25 IU/mL — ABNORMAL LOW (ref 0.30–0.70)

## 2020-05-05 LAB — CBC
HCT: 34.6 % — ABNORMAL LOW (ref 36.0–46.0)
Hemoglobin: 11.5 g/dL — ABNORMAL LOW (ref 12.0–15.0)
MCH: 31.1 pg (ref 26.0–34.0)
MCHC: 33.2 g/dL (ref 30.0–36.0)
MCV: 93.5 fL (ref 80.0–100.0)
Platelets: 230 10*3/uL (ref 150–400)
RBC: 3.7 MIL/uL — ABNORMAL LOW (ref 3.87–5.11)
RDW: 13 % (ref 11.5–15.5)
WBC: 8.9 10*3/uL (ref 4.0–10.5)
nRBC: 0 % (ref 0.0–0.2)

## 2020-05-05 LAB — TROPONIN I (HIGH SENSITIVITY): Troponin I (High Sensitivity): 165 ng/L (ref <18)

## 2020-05-05 LAB — POCT ACTIVATED CLOTTING TIME: Activated Clotting Time: 297 seconds

## 2020-05-05 SURGERY — LEFT HEART CATH AND CORONARY ANGIOGRAPHY
Anesthesia: LOCAL

## 2020-05-05 MED ORDER — LIDOCAINE HCL (PF) 1 % IJ SOLN
INTRAMUSCULAR | Status: DC | PRN
  Administered 2020-05-05: 2 mL

## 2020-05-05 MED ORDER — NITROGLYCERIN 1 MG/10 ML FOR IR/CATH LAB
INTRA_ARTERIAL | Status: AC
  Filled 2020-05-05: qty 10

## 2020-05-05 MED ORDER — VERAPAMIL HCL 2.5 MG/ML IV SOLN
INTRAVENOUS | Status: DC | PRN
  Administered 2020-05-05: 10 mL via INTRA_ARTERIAL

## 2020-05-05 MED ORDER — IOHEXOL 350 MG/ML SOLN
INTRAVENOUS | Status: DC | PRN
  Administered 2020-05-05: 140 mL

## 2020-05-05 MED ORDER — MIDAZOLAM HCL 2 MG/2ML IJ SOLN
INTRAMUSCULAR | Status: AC
  Filled 2020-05-05: qty 2

## 2020-05-05 MED ORDER — NITROGLYCERIN 1 MG/10 ML FOR IR/CATH LAB
INTRA_ARTERIAL | Status: DC | PRN
  Administered 2020-05-05: 200 ug via INTRACORONARY

## 2020-05-05 MED ORDER — THE SENSUOUS HEART BOOK
Freq: Once | Status: AC
  Filled 2020-05-05: qty 1

## 2020-05-05 MED ORDER — SODIUM CHLORIDE 0.9 % IV SOLN: 250.0000 mL | INTRAVENOUS | Status: DC | PRN

## 2020-05-05 MED ORDER — SODIUM CHLORIDE 0.9% FLUSH
3.0000 mL | Freq: Two times a day (BID) | INTRAVENOUS | Status: DC
  Administered 2020-05-05 – 2020-05-06 (×2): 3 mL via INTRAVENOUS

## 2020-05-05 MED ORDER — HEPARIN SODIUM (PORCINE) 1000 UNIT/ML IJ SOLN
INTRAMUSCULAR | Status: AC
  Filled 2020-05-05: qty 1

## 2020-05-05 MED ORDER — ASPIRIN 81 MG PO CHEW
81.0000 mg | CHEWABLE_TABLET | Freq: Every day | ORAL | Status: DC
  Administered 2020-05-06: 81 mg via ORAL
  Filled 2020-05-05: qty 1

## 2020-05-05 MED ORDER — SODIUM CHLORIDE 0.9% FLUSH: 3.0000 mL | INTRAVENOUS | Status: DC | PRN

## 2020-05-05 MED ORDER — FENTANYL CITRATE (PF) 100 MCG/2ML IJ SOLN
INTRAMUSCULAR | Status: DC | PRN
  Administered 2020-05-05: 50 ug via INTRAVENOUS

## 2020-05-05 MED ORDER — ANGIOPLASTY BOOK
Freq: Once | Status: AC
  Filled 2020-05-05: qty 1

## 2020-05-05 MED ORDER — HEPARIN SODIUM (PORCINE) 1000 UNIT/ML IJ SOLN
INTRAMUSCULAR | Status: DC | PRN
  Administered 2020-05-05: 3000 [IU] via INTRAVENOUS
  Administered 2020-05-05: 4000 [IU] via INTRAVENOUS

## 2020-05-05 MED ORDER — LIDOCAINE HCL (PF) 1 % IJ SOLN
INTRAMUSCULAR | Status: AC
  Filled 2020-05-05: qty 30

## 2020-05-05 MED ORDER — VERAPAMIL HCL 2.5 MG/ML IV SOLN
INTRAVENOUS | Status: AC
  Filled 2020-05-05: qty 2

## 2020-05-05 MED ORDER — TICAGRELOR 90 MG PO TABS
ORAL_TABLET | ORAL | Status: DC | PRN
  Administered 2020-05-05: 180 mg via ORAL

## 2020-05-05 MED ORDER — TICAGRELOR 90 MG PO TABS
ORAL_TABLET | ORAL | Status: AC
  Filled 2020-05-05: qty 2

## 2020-05-05 MED ORDER — HEPARIN (PORCINE) IN NACL 1000-0.9 UT/500ML-% IV SOLN
INTRAVENOUS | Status: DC | PRN
  Administered 2020-05-05 (×2): 500 mL

## 2020-05-05 MED ORDER — SODIUM CHLORIDE 0.9 % WEIGHT BASED INFUSION
65.2000 mL/h | INTRAVENOUS | Status: AC
  Administered 2020-05-05: 65.2 mL/h via INTRAVENOUS

## 2020-05-05 MED ORDER — HEPARIN (PORCINE) IN NACL 1000-0.9 UT/500ML-% IV SOLN
INTRAVENOUS | Status: AC
  Filled 2020-05-05: qty 1000

## 2020-05-05 MED ORDER — FENTANYL CITRATE (PF) 100 MCG/2ML IJ SOLN
INTRAMUSCULAR | Status: AC
  Filled 2020-05-05: qty 2

## 2020-05-05 MED ORDER — MIDAZOLAM HCL 2 MG/2ML IJ SOLN
INTRAMUSCULAR | Status: DC | PRN
  Administered 2020-05-05 (×2): 1 mg via INTRAVENOUS

## 2020-05-05 MED ORDER — HEART ATTACK BOUNCING BOOK
Freq: Once | Status: AC
  Filled 2020-05-05: qty 1

## 2020-05-05 MED ORDER — TICAGRELOR 90 MG PO TABS
90.0000 mg | ORAL_TABLET | Freq: Two times a day (BID) | ORAL | Status: DC
  Administered 2020-05-05 – 2020-05-06 (×2): 90 mg via ORAL
  Filled 2020-05-05 (×2): qty 1

## 2020-05-05 SURGICAL SUPPLY — 20 items
BAG SNAP BAND KOVER 36X36 (MISCELLANEOUS) ×2 IMPLANT
BALLN SAPPHIRE 2.5X12 (BALLOONS) ×2
BALLN ~~LOC~~ EMERGE MR 3.0X20 (BALLOONS) ×2
BALLOON SAPPHIRE 2.5X12 (BALLOONS) ×1 IMPLANT
BALLOON ~~LOC~~ EMERGE MR 3.0X20 (BALLOONS) ×1 IMPLANT
CATH INFINITI 5 FR JL3.5 (CATHETERS) ×2 IMPLANT
CATH INFINITI 5FR JK (CATHETERS) ×2 IMPLANT
CATH VISTA GUIDE 6FR JR4 (CATHETERS) ×2 IMPLANT
COVER DOME SNAP 22 D (MISCELLANEOUS) ×2 IMPLANT
DEVICE RAD TR BAND REGULAR (VASCULAR PRODUCTS) ×2 IMPLANT
GLIDESHEATH SLEND SS 6F .021 (SHEATH) ×2 IMPLANT
GUIDEWIRE INQWIRE 1.5J.035X260 (WIRE) ×1 IMPLANT
INQWIRE 1.5J .035X260CM (WIRE) ×2
KIT ENCORE 26 ADVANTAGE (KITS) ×2 IMPLANT
KIT HEART LEFT (KITS) ×2 IMPLANT
PACK CARDIAC CATHETERIZATION (CUSTOM PROCEDURE TRAY) ×2 IMPLANT
STENT RESOLUTE ONYX 2.75X38 (Permanent Stent) ×2 IMPLANT
TRANSDUCER W/STOPCOCK (MISCELLANEOUS) ×2 IMPLANT
TUBING CIL FLEX 10 FLL-RA (TUBING) ×2 IMPLANT
WIRE RUNTHROUGH .014X180CM (WIRE) ×2 IMPLANT

## 2020-05-05 NOTE — Progress Notes (Signed)
Pt leaves cath lab holding area in stable condition. Rt radial is unremarkable. Dressing is CDI. No hematoma or complications. Pt understands post cath instructions concerning rt arm.

## 2020-05-05 NOTE — H&P (View-Only) (Signed)
Progress Note  Patient Name: Susan Sexton Date of Encounter: 05/05/2020  [facility] Cardiologist: Little Ishikawa, MD   Subjective   Denies any chest pain or dyspnea.  Inpatient Medications    Scheduled Meds: . aspirin  81 mg Oral Pre-Cath  . atorvastatin  80 mg Oral Daily  . sodium chloride flush  3 mL Intravenous Q12H  . sodium chloride flush  3 mL Intravenous Q12H   Continuous Infusions: . sodium chloride 10 mL/hr at 05/04/20 1512  . sodium chloride    . sodium chloride    . sodium chloride 1 mL/kg/hr (05/05/20 0530)  . heparin 800 Units/hr (05/04/20 1515)   PRN Meds: sodium chloride, sodium chloride, sodium chloride, acetaminophen, ALPRAZolam, nitroGLYCERIN, ondansetron (ZOFRAN) IV, sodium chloride flush, sodium chloride flush, zolpidem   Vital Signs    Vitals:   05/05/20 0000 05/05/20 0300 05/05/20 0507 05/05/20 0747  BP: 105/68 113/75 121/76 109/77  Pulse: 62 61 (!) 57 76  Resp: 12  17 17   Temp: 98 F (36.7 C) 98.3 F (36.8 C) 98 F (36.7 C) 98.2 F (36.8 C)  TempSrc: Oral Oral Oral Oral  SpO2: 100% 100% 100% 98%  Weight:   65.2 kg   Height:        Intake/Output Summary (Last 24 hours) at 05/05/2020 0904 Last data filed at 05/05/2020 0831 Gross per 24 hour  Intake 0 ml  Output --  Net 0 ml   Last 3 Weights 05/05/2020 05/04/2020 05/04/2020  Weight (lbs) 143 lb 12.8 oz 143 lb 14.4 oz 150 lb  Weight (kg) 65.227 kg 65.273 kg 68.04 kg      Telemetry    NSR in 70s - Personally Reviewed  ECG    No new ECG - Personally Reviewed  Physical Exam   GEN: No acute distress.   Neck: No JVD Cardiac: RRR, no murmurs, rubs, or gallops.  Respiratory: Clear to auscultation bilaterally. GI: Soft, nontender, non-distended  MS: No edema; No deformity. Neuro:  Nonfocal  Psych: Normal affect   Labs    High Sensitivity Troponin:   Recent Labs  Lab 05/04/20 1354 05/04/20 1720 05/04/20 2126 05/05/20 0012  TROPONINIHS 142* 156* 156* 165*       Chemistry Recent Labs  Lab 05/04/20 1354 05/05/20 0012  NA 137 139  K 3.5 3.5  CL 102 104  CO2 26 25  GLUCOSE 82 89  BUN 15 13  CREATININE 0.70 0.69  CALCIUM 9.7 9.2  PROT  --  6.5  ALBUMIN  --  3.6  AST  --  21  ALT  --  19  ALKPHOS  --  70  BILITOT  --  0.5  GFRNONAA >60 >60  ANIONGAP 9 10     Hematology Recent Labs  Lab 05/04/20 1354 05/05/20 0012  WBC 12.1* 8.9  RBC 3.85* 3.70*  HGB 12.3 11.5*  HCT 36.5 34.6*  MCV 94.8 93.5  MCH 31.9 31.1  MCHC 33.7 33.2  RDW 13.3 13.0  PLT 259 230    BNPNo results for input(s): BNP, PROBNP in the last 168 hours.   DDimer  Recent Labs  Lab 05/04/20 1354  DDIMER 0.33     Radiology    DG Chest Portable 1 View  Result Date: 05/04/2020 CLINICAL DATA:  Chest pains, SOB, chest tightness for a few days. EXAM: PORTABLE CHEST 1 VIEW COMPARISON:  Chest radiograph 09/17/2015 FINDINGS: The cardiomediastinal contours are within normal limits. The lungs are clear. No pneumothorax or pleural  effusion. No acute finding in the visualized skeleton. IMPRESSION: No active disease. Electronically Signed   By: Nancy  Ballantyne M.D.   On: 05/04/2020 14:36    Cardiac Studies    Patient Profile     35 y.o. female with a history of HLD, asthma, tob use, HA, FH CAD who presents with NSTEMI  Assessment & Plan    NSTEMI: Presented with chest pain and mild troponin elevation (142).  Despite her age, she does have significant CAD risk factors (LDL 282 on 01/19/2020, father had CABG in 30s, tobacco use).  Suspect she likely has FH given her cholesterol and family history. -Recommend cardiac catheterization.  Risks and benefits of cardiac catheterization have been discussed with the patient.  These include bleeding, infection, kidney damage, stroke, heart attack, death.  The patient understands these risks and is willing to proceed. -Continue ASA, heparin gtt, atorvastatin -Echocardiogram reviewed, normal LV function  Hyperlipidemia: LDL  282 on 01/19/2020.  She was started on atorvastatin 20 mg daily.  LDL 171 on 2/25.  Atorvastatin dose increased to 80 mg daily  Tobacco use: counseled on cessation.  Smokes 1ppd x 15 years  For questions or updates, please contact [facility] Please consult [email or web address] for contact info under        Signed, Nikiyah Fackler L Izzabell Klasen, MD  05/05/2020, 9:04 AM    

## 2020-05-05 NOTE — Progress Notes (Signed)
  Echocardiogram 2D Echocardiogram has been performed.  [name] 05/05/2020, 8:27 AM

## 2020-05-05 NOTE — Progress Notes (Signed)
Progress Note  Patient Name: Susan Sexton Date of Encounter: 05/05/2020  [facility] Cardiologist: Little Ishikawa, MD   Subjective   Denies any chest pain or dyspnea.  Inpatient Medications    Scheduled Meds: . aspirin  81 mg Oral Pre-Cath  . atorvastatin  80 mg Oral Daily  . sodium chloride flush  3 mL Intravenous Q12H  . sodium chloride flush  3 mL Intravenous Q12H   Continuous Infusions: . sodium chloride 10 mL/hr at 05/04/20 1512  . sodium chloride    . sodium chloride    . sodium chloride 1 mL/kg/hr (05/05/20 0530)  . heparin 800 Units/hr (05/04/20 1515)   PRN Meds: sodium chloride, sodium chloride, sodium chloride, acetaminophen, ALPRAZolam, nitroGLYCERIN, ondansetron (ZOFRAN) IV, sodium chloride flush, sodium chloride flush, zolpidem   Vital Signs    Vitals:   05/05/20 0000 05/05/20 0300 05/05/20 0507 05/05/20 0747  BP: 105/68 113/75 121/76 109/77  Pulse: 62 61 (!) 57 76  Resp: 12  17 17   Temp: 98 F (36.7 C) 98.3 F (36.8 C) 98 F (36.7 C) 98.2 F (36.8 C)  TempSrc: Oral Oral Oral Oral  SpO2: 100% 100% 100% 98%  Weight:   65.2 kg   Height:        Intake/Output Summary (Last 24 hours) at 05/05/2020 0904 Last data filed at 05/05/2020 0831 Gross per 24 hour  Intake 0 ml  Output --  Net 0 ml   Last 3 Weights 05/05/2020 05/04/2020 05/04/2020  Weight (lbs) 143 lb 12.8 oz 143 lb 14.4 oz 150 lb  Weight (kg) 65.227 kg 65.273 kg 68.04 kg      Telemetry    NSR in 70s - Personally Reviewed  ECG    No new ECG - Personally Reviewed  Physical Exam   GEN: No acute distress.   Neck: No JVD Cardiac: RRR, no murmurs, rubs, or gallops.  Respiratory: Clear to auscultation bilaterally. GI: Soft, nontender, non-distended  MS: No edema; No deformity. Neuro:  Nonfocal  Psych: Normal affect   Labs    High Sensitivity Troponin:   Recent Labs  Lab 05/04/20 1354 05/04/20 1720 05/04/20 2126 05/05/20 0012  TROPONINIHS 142* 156* 156* 165*       Chemistry Recent Labs  Lab 05/04/20 1354 05/05/20 0012  NA 137 139  K 3.5 3.5  CL 102 104  CO2 26 25  GLUCOSE 82 89  BUN 15 13  CREATININE 0.70 0.69  CALCIUM 9.7 9.2  PROT  --  6.5  ALBUMIN  --  3.6  AST  --  21  ALT  --  19  ALKPHOS  --  70  BILITOT  --  0.5  GFRNONAA >60 >60  ANIONGAP 9 10     Hematology Recent Labs  Lab 05/04/20 1354 05/05/20 0012  WBC 12.1* 8.9  RBC 3.85* 3.70*  HGB 12.3 11.5*  HCT 36.5 34.6*  MCV 94.8 93.5  MCH 31.9 31.1  MCHC 33.7 33.2  RDW 13.3 13.0  PLT 259 230    BNPNo results for input(s): BNP, PROBNP in the last 168 hours.   DDimer  Recent Labs  Lab 05/04/20 1354  DDIMER 0.33     Radiology    DG Chest Portable 1 View  Result Date: 05/04/2020 CLINICAL DATA:  Chest pains, SOB, chest tightness for a few days. EXAM: PORTABLE CHEST 1 VIEW COMPARISON:  Chest radiograph 09/17/2015 FINDINGS: The cardiomediastinal contours are within normal limits. The lungs are clear. No pneumothorax or pleural  effusion. No acute finding in the visualized skeleton. IMPRESSION: No active disease. Electronically Signed   By: Emmaline Kluver M.D.   On: 05/04/2020 14:36    Cardiac Studies    Patient Profile     35 y.o. female with a history of HLD, asthma, tob use, HA, FH CAD who presents with NSTEMI  Assessment & Plan    NSTEMI: Presented with chest pain and mild troponin elevation (142).  Despite her age, she does have significant CAD risk factors (LDL 282 on 01/19/2020, father had CABG in 30s, tobacco use).  Suspect she likely has FH given her cholesterol and family history. -Recommend cardiac catheterization.  Risks and benefits of cardiac catheterization have been discussed with the patient.  These include bleeding, infection, kidney damage, stroke, heart attack, death.  The patient understands these risks and is willing to proceed. -Continue ASA, heparin gtt, atorvastatin -Echocardiogram reviewed, normal LV function  Hyperlipidemia: LDL  282 on 01/19/2020.  She was started on atorvastatin 20 mg daily.  LDL 171 on 2/25.  Atorvastatin dose increased to 80 mg daily  Tobacco use: counseled on cessation.  Smokes 1ppd x 15 years  For questions or updates, please contact [facility] Please consult [email or web address] for contact info under        Signed, Little Ishikawa, MD  05/05/2020, 9:04 AM

## 2020-05-05 NOTE — Interval H&P Note (Signed)
Cath Lab Visit (complete for each Cath Lab visit)  Clinical Evaluation Leading to the Procedure:   ACS: Yes.    Non-ACS:  n/a    History and Physical Interval Note:  05/05/2020 12:40 PM  Susan Sexton  has presented today for surgery, with the diagnosis of NSTEMI.  The various methods of treatment have been discussed with the patient and family. After consideration of risks, benefits and other options for treatment, the patient has consented to  Procedure(s): LEFT HEART CATH AND CORONARY ANGIOGRAPHY (N/A) as a surgical intervention.  The patient's history has been reviewed, patient examined, no change in status, stable for surgery.  I have reviewed the patient's chart and labs.  Questions were answered to the patient's satisfaction.     [name]

## 2020-05-05 NOTE — Progress Notes (Signed)
ANTICOAGULATION CONSULT NOTE - Initial Consult  Pharmacy Consult for heparin Indication: chest pain/ACS  No Known Allergies  Patient Measurements: Height: 5\' 7"  (170.2 cm) Weight: 65.2 kg (143 lb 12.8 oz) IBW/kg (Calculated) : 61.6 Heparin Dosing Weight: 68  Vital Signs: Temp: 98.2 F (36.8 C) (02/25 0747) Temp Source: Oral (02/25 0747) BP: 109/77 (02/25 0747) Pulse Rate: 76 (02/25 0747)  Labs: Recent Labs    05/04/20 1354 05/04/20 1510 05/04/20 1720 05/04/20 2126 05/05/20 0012 05/05/20 0747  HGB 12.3  --   --   --  11.5*  --   HCT 36.5  --   --   --  34.6*  --   PLT 259  --   --   --  230  --   APTT 30  --   --   --   --   --   LABPROT 13.0  --   --   --   --   --   INR 1.0  --   --   --   --   --   HEPARINUNFRC  --  <0.10*  --  0.32  --  0.25*  CREATININE 0.70  --   --   --  0.69  --   TROPONINIHS 142*  --  156* 156* 165*  --     Estimated Creatinine Clearance: 96.4 mL/min (by C-G formula based on SCr of 0.69 mg/dL).  Assessment: new onset CP, with positive troponin  Heparin level came back slightly subtherapeutic this AM. She is going to cath today. We will bump up the rate in the meantime.   Goal of Therapy:  Heparin level 0.3-0.7 units/ml Monitor platelets by anticoagulation protocol: Yes   Plan:  Increase heparin 900 units/hr Daily hep lvl cbc  05/07/20, PharmD, BCIDP, AAHIVP, CPP Infectious Disease Pharmacist 05/05/2020 10:32 AM

## 2020-05-06 DIAGNOSIS — I2510 Atherosclerotic heart disease of native coronary artery without angina pectoris: Secondary | ICD-10-CM

## 2020-05-06 LAB — BASIC METABOLIC PANEL
Anion gap: 8 (ref 5–15)
BUN: 10 mg/dL (ref 6–20)
CO2: 23 mmol/L (ref 22–32)
Calcium: 9.3 mg/dL (ref 8.9–10.3)
Chloride: 105 mmol/L (ref 98–111)
Creatinine, Ser: 0.74 mg/dL (ref 0.44–1.00)
GFR, Estimated: >60 mL/min (ref >60)
Glucose, Bld: 88 mg/dL (ref 70–99)
Potassium: 3.2 mmol/L — ABNORMAL LOW (ref 3.5–5.1)
Sodium: 136 mmol/L (ref 135–145)

## 2020-05-06 LAB — CBC
HCT: 33.6 % — ABNORMAL LOW (ref 36.0–46.0)
Hemoglobin: 11.9 g/dL — ABNORMAL LOW (ref 12.0–15.0)
MCH: 32.2 pg (ref 26.0–34.0)
MCHC: 35.4 g/dL (ref 30.0–36.0)
MCV: 91.1 fL (ref 80.0–100.0)
Platelets: 223 10*3/uL (ref 150–400)
RBC: 3.69 MIL/uL — ABNORMAL LOW (ref 3.87–5.11)
RDW: 12.9 % (ref 11.5–15.5)
WBC: 8.7 10*3/uL (ref 4.0–10.5)
nRBC: 0 % (ref 0.0–0.2)

## 2020-05-06 MED ORDER — NITROGLYCERIN 0.4 MG SL SUBL: 0.4000 mg | SUBLINGUAL_TABLET | SUBLINGUAL | 2 refills | Status: DC | PRN

## 2020-05-06 MED ORDER — NICOTINE POLACRILEX 4 MG MT GUM: 4.0000 mg | CHEWING_GUM | OROMUCOSAL | 1 refills | Status: DC | PRN

## 2020-05-06 MED ORDER — ATORVASTATIN CALCIUM 80 MG PO TABS: 80.0000 mg | ORAL_TABLET | Freq: Every day | ORAL | 5 refills | Status: DC

## 2020-05-06 MED ORDER — TICAGRELOR 90 MG PO TABS: 90.0000 mg | ORAL_TABLET | Freq: Two times a day (BID) | ORAL | 11 refills | Status: DC

## 2020-05-06 MED ORDER — ASPIRIN 81 MG PO CHEW: 81.0000 mg | CHEWABLE_TABLET | Freq: Every day | ORAL | Status: DC

## 2020-05-06 MED ORDER — LOSARTAN POTASSIUM 25 MG PO TABS: 12.5000 mg | ORAL_TABLET | Freq: Every day | ORAL | 2 refills | Status: DC

## 2020-05-06 MED ORDER — LOSARTAN POTASSIUM 25 MG PO TABS: 12.5000 mg | ORAL_TABLET | Freq: Every day | ORAL | Status: DC

## 2020-05-06 MED ORDER — METOPROLOL TARTRATE 12.5 MG HALF TABLET: 12.5000 mg | ORAL_TABLET | Freq: Two times a day (BID) | ORAL | Status: DC

## 2020-05-06 MED ORDER — POTASSIUM CHLORIDE CRYS ER 20 MEQ PO TBCR
40.0000 meq | EXTENDED_RELEASE_TABLET | Freq: Once | ORAL | Status: AC
  Administered 2020-05-06: 40 meq via ORAL
  Filled 2020-05-06: qty 2

## 2020-05-06 NOTE — Discharge Summary (Signed)
Discharge Summary    Patient ID: ZOHA SPRANGER MRN: [id number]; DOB: [birth year]  Admit date: 05/04/2020 Discharge date: 05/06/2020  PCP:  System, Provider Not In   [facility]  Cardiologist:  Little Ishikawa, MD  Electrophysiologist:  None [id number]}    Discharge Diagnoses    Principal Problem:   NSTEMI (non-ST elevated myocardial infarction) [facility]) Active Problems:   Tobacco abuse   Hyperlipidemia LDL goal <100  Diagnostic Studies/Procedures    Echocardiogram 05/05/2020: Impressions: 1. Left ventricular ejection fraction, by estimation, is 60 to 65%. The  left ventricle has normal function. The left ventricle has no regional  wall motion abnormalities. There is mild left ventricular hypertrophy.  Left ventricular diastolic parameters  were normal.  2. Right ventricular systolic function is normal. The right ventricular  size is normal. There is normal pulmonary artery systolic pressure. The  estimated right ventricular systolic pressure is 35.8 mmHg.  3. Right atrial size was mildly dilated.  4. The mitral valve is normal in structure. No evidence of mitral valve  regurgitation. No evidence of mitral stenosis.  5. The aortic valve is normal in structure. Aortic valve regurgitation is  not visualized. No aortic stenosis is present.  6. The inferior vena cava is dilated in size with <50% respiratory  variability, suggesting right atrial pressure of 15 mmHg. _____________  Cardiac Catheterization 05/05/2020:  Prox RCA lesion is 99% stenosed.  Prox RCA to Mid RCA lesion is 50% stenosed.  Dist RCA lesion is 20% stenosed.  Prox Cx to Mid Cx lesion is 40% stenosed.  Prox LAD to Mid LAD lesion is 80% stenosed.  A drug-eluting stent was successfully placed using a STENT RESOLUTE ONYX [id number].  Post intervention, there is a 0% residual stenosis.  Post intervention, there is a 0% residual stenosis.   1.  Significant two-vessel  coronary artery disease.  The culprit is 99% stenosis in the proximal RCA.  In addition, the mid RCA has moderate diffuse disease.  The LAD has 80% proximal/mid LAD at the bifurcation of a large diagonal branch. 2.  Left ventricular angiography was not performed.  EF was normal by echo.  Mildly elevated left ventricular end-diastolic pressure.\ 3.  Successful angioplasty and drug-eluting stent placement to the RCA.  I elected to use a long stent to cover the moderate disease in the mid vessel in addition to the critical stenosis in the proximal segment.  Recommendations: Dual antiplatelet therapy for at least 12 months. Aggressive treatment of risk factors especially her hyperlipidemia.  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 Recommend staged LAD PCI in few weeks.  This was not done today given difficulty sedating the patient. Can likely discharge home tomorrow if no issues.  Diagnostic Dominance: Right    Intervention         History of Present Illness     Ms. Susan Sexton is a 35 year old female with a history of hyperlipidemia, asthma, and tobacco use who was admitted on 05/04/2020 with NSTEMI.   Patient was in her usual state of health until 2 nights prior to presentation when she was doing housework that was fairly strenuous (moving around furniture in order to clean) and developed substernal chest pain that she described as a pressure. She ranked the pain as a 7/10 on the pain scale. She had some mild associated shortness of breath but no nausea, vomiting, or diaphoresis. Chet pain resolved with rest. She denied  any chest pain prior to this. The following day she had some lower extremity edema and just did not feel right.   On day of presentation, she went to work but was not feeling well. Therefore, she went to [facility] ED where she was found to have elevated high-sensitivity troponin of 142. She was  transferred to [facility] where she was admitted for NSTEMI.      Hospital Course     Consultants: None  NSTEMI  High-sensitivity troponin peaked at 165. Echo showed LVEF of 60-65% with normal wall motion and normal diastolic function. Left heart catheterization on 05/05/2020 showed 99% stenosis of proximal RCA followed by 50% stenosis of proximal to mid RCA, 80% stenosis of proximal to mid LAD, 40% stenosis of proximal to mid LCX, and 20% stenosis of distal RCA. 99% stenosis of proximal RCA felt to be culprit lesion. Patient underwent successful angioplasty and DES to RCA with long stent to cover the moderate disease in the mid vessel as well as the critical stenosis in the proximal segment.Plan is for staged PCI of LAD in a few weeks as an outpatient. Patient tolerated procedure well with no recurrent chest pain. She was started on DAPT with Aspirin and Brilinta. No beta-blocker due to low resting heart rates. Lipitor increased to high-intensity statin. Low dose Losartan also started. Will need repeat BMET at follow-up visit.  Hyperlipidemia  LDL was 282 in 01/2020. She was started on Lipitor 20mg  daily at that time. Lipid panel this admission showed: Total Cholesterol 230, Triglycerides 52, HDL 49, LDL 171. LDL goal <70 given CAD. Lipitor was increased to 80mg  daily. Will need repeat lipid panel and LFTs in 6-8 weeks.   Tobacco Abuse  Patient has a 17 pack year smoking history. Discussed the importance of complete cessation. She is willing to quit. Will provider prescription for Nicotine gum per patient's request.  Patient seen and examined by Dr. today and determined to be stable for discharge. Will arrange outpatient follow-up. Medications as below.    Did the patient have an acute coronary syndrome (MI, NSTEMI, STEMI, etc) this admission?:  Yes                               AHA/ACC Clinical Performance & Quality Measures: 1. Aspirin prescribed? - Yes 2. ADP Receptor Inhibitor  (Plavix/Clopidogrel, Brilinta/Ticagrelor or Effient/Prasugrel) prescribed (includes medically managed patients)? - Yes 3. Beta Blocker prescribed? - No - low resting heart rates 4. High Intensity Statin (Lipitor 40-80mg  or Crestor 20-40mg ) prescribed? - Yes 5. EF assessed during THIS hospitalization? - Yes 6. For EF <40%, was ACEI/ARB prescribed? - Not Applicable (EF >/= 40%) 7. For EF <40%, Aldosterone Antagonist (Spironolactone or Eplerenone) prescribed? - Not Applicable (EF >/= 40%) 8. Cardiac Rehab Phase II ordered (including medically managed patients)? - Yes       _____________  Discharge Vitals Blood pressure 125/90, pulse (!) 59, temperature 98.6 F (37 C), temperature source Oral, resp. rate 18, height 5\' 8"  (1.727 m), weight 64.7 kg, last menstrual period 04/25/2020, SpO2 100 %.  Filed Weights   05/05/20 0507 05/05/20 1505 05/06/20 0412  Weight: 65.2 kg 64.9 kg 64.7 kg   General: 35 y.o. female resting comfortably in no acute distress. HEENT: Normocephalic and atraumatic. Sclera clear.  Neck: Supple. No carotid bruits.  Heart: RRR. Distinct S1 and S2. No murmurs, gallops, or rubs. Radial pulses and distal pedal pulses 2+ and  equal bilaterally. Right radial cath site soft/clean/dry with no signs of hematoma. Lungs: No increased work of breathing. Clear to ausculation bilaterally. No wheezes, rhonchi, or rales.  Abdomen: Soft, non-distended, and non-tender to palpation.  MSK: Normal strength and tone for age. Extremities: No lower extremity edema.    Skin: Warm and dry. Neuro: No focal deficits. Psych: Normal affect. Responds appropriately.  Labs & Radiologic Studies    CBC Recent Labs    05/04/20 1354 05/05/20 0012 05/06/20 0256  WBC 12.1* 8.9 8.7  NEUTROABS 8.5*  --   --   HGB 12.3 11.5* 11.9*  HCT 36.5 34.6* 33.6*  MCV 94.8 93.5 91.1  PLT 259 230 223   Basic Metabolic Panel Recent Labs    24/26/83 0012 05/06/20 0256  NA 139 136  K 3.5 3.2*  CL 104 105   CO2 25 23  GLUCOSE 89 88  BUN 13 10  CREATININE 0.69 0.74  CALCIUM 9.2 9.3   Liver Function Tests Recent Labs    05/05/20 0012  AST 21  ALT 19  ALKPHOS 70  BILITOT 0.5  PROT 6.5  ALBUMIN 3.6   No results for input(s): LIPASE, AMYLASE in the last 72 hours. High Sensitivity Troponin:   Recent Labs  Lab 05/04/20 1354 05/04/20 1720 05/04/20 2126 05/05/20 0012  TROPONINIHS 142* 156* 156* 165*    BNP Invalid input(s): POCBNP D-Dimer Recent Labs    05/04/20 1354  DDIMER 0.33   Hemoglobin A1C No results for input(s): HGBA1C in the last 72 hours. Fasting Lipid Panel Recent Labs    05/05/20 0012  CHOL 230*  HDL 49  LDLCALC 171*  TRIG 52  CHOLHDL 4.7   Thyroid Function Tests Recent Labs    05/04/20 2126  TSH 4.381   _____________  CARDIAC CATHETERIZATION  Result Date: 05/05/2020  Prox RCA lesion is 99% stenosed.  Prox RCA to Mid RCA lesion is 50% stenosed.  Dist RCA lesion is 20% stenosed.  Prox Cx to Mid Cx lesion is 40% stenosed.  Prox LAD to Mid LAD lesion is 80% stenosed.  A drug-eluting stent was successfully placed using a STENT RESOLUTE ONYX [id number].  Post intervention, there is a 0% residual stenosis.  Post intervention, there is a 0% residual stenosis.  1.  Significant two-vessel coronary artery disease.  The culprit is 99% stenosis in the proximal RCA.  In addition, the mid RCA has moderate diffuse disease.  The LAD has 80% proximal/mid LAD at the bifurcation of a large diagonal branch. 2.  Left ventricular angiography was not performed.  EF was normal by echo.  Mildly elevated left ventricular end-diastolic pressure.\ 3.  Successful angioplasty and drug-eluting stent placement to the RCA.  I elected to use a long stent to cover the moderate disease in the mid vessel in addition to the critical stenosis in the proximal segment. Recommendations: Dual antiplatelet therapy for at least 12 months. Aggressive treatment of risk factors especially her  hyperlipidemia.  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 Recommend staged LAD PCI in few weeks.  This was not done today given difficulty sedating the patient. Can likely discharge home tomorrow if no issues.   DG Chest Portable 1 View  Result Date: 05/04/2020 CLINICAL DATA:  Chest pains, SOB, chest tightness for a few days. EXAM: PORTABLE CHEST 1 VIEW COMPARISON:  Chest radiograph 09/17/2015 FINDINGS: The cardiomediastinal contours are within normal limits. The lungs are clear. No pneumothorax or pleural effusion.  No acute finding in the visualized skeleton. IMPRESSION: No active disease. Electronically Signed   By: Emmaline Kluver M.D.   On: 05/04/2020 14:36   ECHOCARDIOGRAM COMPLETE  Result Date: 05/05/2020    ECHOCARDIOGRAM REPORT   Patient Name:   Susan Sexton Mesch Date of Exam: 05/05/2020 Medical Rec #:  [id number]      Height:       67.0 in Accession #:    [id number]     Weight:       143.8 lb Date of Birth:  [birth year]      BSA:          1.758 m Patient Age:    34 years       BP:           121/76 mmHg Patient Gender: F              HR:           57 bpm. Exam Location:  Inpatient Procedure: 2D Echo, Cardiac Doppler and Color Doppler Indications:    Chest pain  History:        Patient has no prior history of Echocardiogram examinations.                 CAD; Risk Factors:Current Smoker and Dyslipidemia.  Sonographer:    Ross Ludwig RDCS (AE) Referring Phys: [id number] RHONDA G BARRETT IMPRESSIONS  1. Left ventricular ejection fraction, by estimation, is 60 to 65%. The left ventricle has normal function. The left ventricle has no regional wall motion abnormalities. There is mild left ventricular hypertrophy. Left ventricular diastolic parameters were normal.  2. Right ventricular systolic function is normal. The right ventricular size is normal. There is normal pulmonary artery systolic pressure. The estimated right ventricular systolic pressure is 35.8  mmHg.  3. Right atrial size was mildly dilated.  4. The mitral valve is normal in structure. No evidence of mitral valve regurgitation. No evidence of mitral stenosis.  5. The aortic valve is normal in structure. Aortic valve regurgitation is not visualized. No aortic stenosis is present.  6. The inferior vena cava is dilated in size with <50% respiratory variability, suggesting right atrial pressure of 15 mmHg. FINDINGS  Left Ventricle: Left ventricular ejection fraction, by estimation, is 60 to 65%. The left ventricle has normal function. The left ventricle has no regional wall motion abnormalities. The left ventricular internal cavity size was normal in size. There is  mild left ventricular hypertrophy. Left ventricular diastolic parameters were normal. Right Ventricle: The right ventricular size is normal. No increase in right ventricular wall thickness. Right ventricular systolic function is normal. There is normal pulmonary artery systolic pressure. The tricuspid regurgitant velocity is 2.28 m/s, and  with an assumed right atrial pressure of 15 mmHg, the estimated right ventricular systolic pressure is 35.8 mmHg. Left Atrium: Left atrial size was normal in size. Right Atrium: Right atrial size was mildly dilated. Pericardium: There is no evidence of pericardial effusion. Mitral Valve: The mitral valve is normal in structure. No evidence of mitral valve regurgitation. No evidence of mitral valve stenosis. MV peak gradient, 4.2 mmHg. The mean mitral valve gradient is 2.0 mmHg. Tricuspid Valve: The tricuspid valve is normal in structure. Tricuspid valve regurgitation is mild . No evidence of tricuspid stenosis. Aortic Valve: The aortic valve is normal in structure. Aortic valve regurgitation is not visualized. No aortic stenosis is present. Aortic valve mean gradient measures 4.0 mmHg. Aortic valve peak gradient measures 7.7 mmHg. Aortic valve  area, by VTI measures 2.13 cm. Pulmonic Valve: The pulmonic valve was  normal in structure. Pulmonic valve regurgitation is trivial. No evidence of pulmonic stenosis. Aorta: The aortic root is normal in size and structure. Venous: The inferior vena cava is dilated in size with less than 50% respiratory variability, suggesting right atrial pressure of 15 mmHg. IAS/Shunts: No atrial level shunt detected by color flow Doppler.  LEFT VENTRICLE PLAX 2D LVIDd:         3.60 cm  Diastology LVIDs:         2.40 cm  LV e' medial:    10.70 cm/s LV PW:         1.00 cm  LV E/e' medial:  7.9 LV IVS:        1.20 cm  LV e' lateral:   11.60 cm/s LVOT diam:     1.90 cm  LV E/e' lateral: 7.3 LV SV:         60 LV SV Index:   34 LVOT Area:     2.84 cm  RIGHT VENTRICLE             IVC RV Basal diam:  3.00 cm     IVC diam: 2.20 cm RV S prime:     14.10 cm/s TAPSE (M-mode): 2.6 cm LEFT ATRIUM             Index       RIGHT ATRIUM           Index LA diam:        2.60 cm 1.48 cm/m  RA Area:     15.80 cm LA Vol (A2C):   40.6 ml 23.10 ml/m RA Volume:   45.40 ml  25.83 ml/m LA Vol (A4C):   42.2 ml 24.01 ml/m LA Biplane Vol: 42.7 ml 24.29 ml/m  AORTIC VALVE AV Area (Vmax):    1.89 cm AV Area (Vmean):   2.00 cm AV Area (VTI):     2.13 cm AV Vmax:           139.00 cm/s AV Vmean:          92.200 cm/s AV VTI:            0.284 m AV Peak Grad:      7.7 mmHg AV Mean Grad:      4.0 mmHg LVOT Vmax:         92.70 cm/s LVOT Vmean:        64.900 cm/s LVOT VTI:          0.213 m LVOT/AV VTI ratio: 0.75  AORTA Ao Root diam: 2.60 cm Ao Asc diam:  2.70 cm MITRAL VALVE               TRICUSPID VALVE MV Area (PHT): 3.27 cm    TR Peak grad:   20.8 mmHg MV Area VTI:   2.19 cm    TR Vmax:        228.00 cm/s MV Peak grad:  4.2 mmHg MV Mean grad:  2.0 mmHg    SHUNTS MV Vmax:       1.02 m/s    Systemic VTI:  0.21 m MV Vmean:      62.4 cm/s   Systemic Diam: 1.90 cm MV Decel Time: 232 msec MV E velocity: 84.80 cm/s MV A velocity: 51.80 cm/s MV E/A ratio:  1.64 Donato SchultzMark Skains MD Electronically signed by Donato SchultzMark Skains MD Signature  Date/Time: 05/05/2020/11:23:42 AM    Final  Disposition   Patient is being discharged home today in good condition.  Follow-up Plans & Appointments     Follow-up Information    Little Ishikawa, MD Follow up.   Specialties: Cardiology, Radiology Why: Our office will call you to schedule a follow-up visit. If you do not hear from [place] within 2 business days, please give our office a call. Contact information: [address] [place] [address] [phone]              Discharge Instructions    Amb Referral to Cardiac Rehabilitation   Complete by: As directed    Diagnosis:  NSTEMI Coronary Stents     After initial evaluation and assessments completed: Virtual Based Care may be provided alone or in conjunction with Phase 2 Cardiac Rehab based on patient barriers.: Yes   Diet - low sodium heart healthy   Complete by: As directed    Increase activity slowly   Complete by: As directed       Discharge Medications   Allergies as of 05/06/2020   No Known Allergies     Medication List    STOP taking these medications   cephALEXin 500 MG capsule Commonly known as: Keflex   ibuprofen 400 MG tablet Commonly known as: ADVIL   loperamide 2 MG capsule Commonly known as: IMODIUM   metroNIDAZOLE 500 MG tablet Commonly known as: FLAGYL   ondansetron 4 MG disintegrating tablet Commonly known as: Zofran ODT   phenazopyridine 200 MG tablet Commonly known as: PYRIDIUM     TAKE these medications   albuterol 108 (90 Base) MCG/ACT inhaler Commonly known as: VENTOLIN HFA Inhale 2 puffs into the lungs every 6 (six) hours as needed for shortness of breath.   aspirin 81 MG chewable tablet Chew 1 tablet (81 mg total) by mouth daily.   atorvastatin 80 MG tablet Commonly known as: LIPITOR Take 1 tablet (80 mg total) by mouth daily. What changed:   medication strength  how much to take   losartan 25 MG tablet Commonly known as: COZAAR Take 0.5  tablets (12.5 mg total) by mouth daily.   MULTI ADULT GUMMIES PO Take 1 tablet by mouth daily.   nicotine polacrilex 4 MG gum Commonly known as: NICORETTE Take 1 each (4 mg total) by mouth as needed for smoking cessation. Take  every 1-2 hours as needed for 6 weeks, then every 2-4 hours as needed for 3 weeks, then every 4-8 hours for 3 weeks. Avoid food/drink 15 mins before and after use.   nitroGLYCERIN 0.4 MG SL tablet Commonly known as: NITROSTAT Place 1 tablet (0.4 mg total) under the tongue every 5 (five) minutes x 3 doses as needed for chest pain.   ticagrelor 90 MG Tabs tablet Commonly known as: BRILINTA Take 1 tablet (90 mg total) by mouth 2 (two) times daily.          Outstanding Labs/Studies   Repeat BMET at follow-up visit after starting ARB. Repeat lipid panel and LFTs in 6-8 weeks after increasing statin.  Duration of Discharge Encounter   Greater than 30 minutes including physician time.  Signed, Corrin Parker, PA-C 05/06/2020, 11:30 AM

## 2020-05-06 NOTE — Progress Notes (Signed)
Progress Note  Patient Name: Susan Sexton Date of Encounter: 05/06/2020  [facility] Cardiologist: Little Ishikawa, MD   Subjective   No complaints  Inpatient Medications    Scheduled Meds: . aspirin  81 mg Oral Daily  . atorvastatin  80 mg Oral Daily  . sodium chloride flush  3 mL Intravenous Q12H  . sodium chloride flush  3 mL Intravenous Q12H  . ticagrelor  90 mg Oral BID   Continuous Infusions: . sodium chloride 68 mL/hr at 05/05/20 0529  . sodium chloride    . sodium chloride     PRN Meds: sodium chloride, sodium chloride, sodium chloride, acetaminophen, ALPRAZolam, nitroGLYCERIN, ondansetron (ZOFRAN) IV, sodium chloride flush, sodium chloride flush, zolpidem   Vital Signs    Vitals:   05/05/20 2243 05/06/20 0412 05/06/20 0909 05/06/20 0910  BP: 118/80 127/83 125/90   Pulse: 66  (!) 59   Resp: 16  18   Temp: 98 F (36.7 C) 98 F (36.7 C) 98.6 F (37 C) 98.6 F (37 C)  TempSrc: Oral Oral  Oral  SpO2:   100%   Weight:  64.7 kg    Height:        Intake/Output Summary (Last 24 hours) at 05/06/2020 1114 Last data filed at 05/06/2020 1008 Gross per 24 hour  Intake 2021.13 ml  Output 400 ml  Net 1621.13 ml   Last 3 Weights 05/06/2020 05/05/2020 05/05/2020  Weight (lbs) 142 lb 10.2 oz 143 lb 143 lb 12.8 oz  Weight (kg) 64.7 kg 64.864 kg 65.227 kg      Telemetry    NSR - Personally Reviewed  ECG    n/a - Personally Reviewed  Physical Exam   GEN: No acute distress.   Neck: No JVD Cardiac: RRR, no murmurs, rubs, or gallops.  Respiratory: Clear to auscultation bilaterally. GI: Soft, nontender, non-distended  MS: No edema; No deformity. Neuro:  Nonfocal  Psych: Normal affect   Labs    High Sensitivity Troponin:   Recent Labs  Lab 05/04/20 1354 05/04/20 1720 05/04/20 2126 05/05/20 0012  TROPONINIHS 142* 156* 156* 165*      Chemistry Recent Labs  Lab 05/04/20 1354 05/05/20 0012 05/06/20 0256  NA 137 139 136  K 3.5 3.5  3.2*  CL 102 104 105  CO2 26 25 23   GLUCOSE 82 89 88  BUN 15 13 10   CREATININE 0.70 0.69 0.74  CALCIUM 9.7 9.2 9.3  PROT  --  6.5  --   ALBUMIN  --  3.6  --   AST  --  21  --   ALT  --  19  --   ALKPHOS  --  70  --   BILITOT  --  0.5  --   GFRNONAA >60 >60 >60  ANIONGAP 9 10 8      Hematology Recent Labs  Lab 05/04/20 1354 05/05/20 0012 05/06/20 0256  WBC 12.1* 8.9 8.7  RBC 3.85* 3.70* 3.69*  HGB 12.3 11.5* 11.9*  HCT 36.5 34.6* 33.6*  MCV 94.8 93.5 91.1  MCH 31.9 31.1 32.2  MCHC 33.7 33.2 35.4  RDW 13.3 13.0 12.9  PLT 259 230 223    BNPNo results for input(s): BNP, PROBNP in the last 168 hours.   DDimer  Recent Labs  Lab 05/04/20 1354  DDIMER 0.33     Radiology    CARDIAC CATHETERIZATION  Result Date: 05/05/2020  Prox RCA lesion is 99% stenosed.  Prox RCA to Mid RCA lesion  is 50% stenosed.  Dist RCA lesion is 20% stenosed.  Prox Cx to Mid Cx lesion is 40% stenosed.  Prox LAD to Mid LAD lesion is 80% stenosed.  A drug-eluting stent was successfully placed using a STENT RESOLUTE ONYX [id number].  Post intervention, there is a 0% residual stenosis.  Post intervention, there is a 0% residual stenosis.  1.  Significant two-vessel coronary artery disease.  The culprit is 99% stenosis in the proximal RCA.  In addition, the mid RCA has moderate diffuse disease.  The LAD has 80% proximal/mid LAD at the bifurcation of a large diagonal Reilley Valentine. 2.  Left ventricular angiography was not performed.  EF was normal by echo.  Mildly elevated left ventricular end-diastolic pressure.\ 3.  Successful angioplasty and drug-eluting stent placement to the RCA.  I elected to use a long stent to cover the moderate disease in the mid vessel in addition to the critical stenosis in the proximal segment. Recommendations: Dual antiplatelet therapy for at least 12 months. Aggressive treatment of risk factors especially her hyperlipidemia.  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 Recommend staged LAD PCI in few weeks.  This was not done today given difficulty sedating the patient. Can likely discharge home tomorrow if no issues.   DG Chest Portable 1 View  Result Date: 05/04/2020 CLINICAL DATA:  Chest pains, SOB, chest tightness for a few days. EXAM: PORTABLE CHEST 1 VIEW COMPARISON:  Chest radiograph 09/17/2015 FINDINGS: The cardiomediastinal contours are within normal limits. The lungs are clear. No pneumothorax or pleural effusion. No acute finding in the visualized skeleton. IMPRESSION: No active disease. Electronically Signed   By: Emmaline KluverNancy  Ballantyne M.D.   On: 05/04/2020 14:36   ECHOCARDIOGRAM COMPLETE  Result Date: 05/05/2020    ECHOCARDIOGRAM REPORT   Patient Name:   Lind CovertLATOYA S Stormes Date of Exam: 05/05/2020 Medical Rec #:  [id number]      Height:       67.0 in Accession #:    [id number]     Weight:       143.8 lb Date of Birth:  1985/03/15      BSA:          1.758 m Patient Age:    35 years       BP:           121/76 mmHg Patient Gender: F              HR:           57 bpm. Exam Location:  Inpatient Procedure: 2D Echo, Cardiac Doppler and Color Doppler Indications:    Chest pain  History:        Patient has no prior history of Echocardiogram examinations.                 CAD; Risk Factors:Current Smoker and Dyslipidemia.  Sonographer:    Ross LudwigArthur Guy RDCS (AE) Referring Phys: [id number] RHONDA G BARRETT IMPRESSIONS  1. Left ventricular ejection fraction, by estimation, is 60 to 65%. The left ventricle has normal function. The left ventricle has no regional wall motion abnormalities. There is mild left ventricular hypertrophy. Left ventricular diastolic parameters were normal.  2. Right ventricular systolic function is normal. The right ventricular size is normal. There is normal pulmonary artery systolic pressure. The estimated right ventricular systolic pressure is 35.8 mmHg.  3. Right atrial size was mildly dilated.  4. The mitral valve is  normal in structure. No  evidence of mitral valve regurgitation. No evidence of mitral stenosis.  5. The aortic valve is normal in structure. Aortic valve regurgitation is not visualized. No aortic stenosis is present.  6. The inferior vena cava is dilated in size with <50% respiratory variability, suggesting right atrial pressure of 15 mmHg. FINDINGS  Left Ventricle: Left ventricular ejection fraction, by estimation, is 60 to 65%. The left ventricle has normal function. The left ventricle has no regional wall motion abnormalities. The left ventricular internal cavity size was normal in size. There is  mild left ventricular hypertrophy. Left ventricular diastolic parameters were normal. Right Ventricle: The right ventricular size is normal. No increase in right ventricular wall thickness. Right ventricular systolic function is normal. There is normal pulmonary artery systolic pressure. The tricuspid regurgitant velocity is 2.28 m/s, and  with an assumed right atrial pressure of 15 mmHg, the estimated right ventricular systolic pressure is 35.8 mmHg. Left Atrium: Left atrial size was normal in size. Right Atrium: Right atrial size was mildly dilated. Pericardium: There is no evidence of pericardial effusion. Mitral Valve: The mitral valve is normal in structure. No evidence of mitral valve regurgitation. No evidence of mitral valve stenosis. MV peak gradient, 4.2 mmHg. The mean mitral valve gradient is 2.0 mmHg. Tricuspid Valve: The tricuspid valve is normal in structure. Tricuspid valve regurgitation is mild . No evidence of tricuspid stenosis. Aortic Valve: The aortic valve is normal in structure. Aortic valve regurgitation is not visualized. No aortic stenosis is present. Aortic valve mean gradient measures 4.0 mmHg. Aortic valve peak gradient measures 7.7 mmHg. Aortic valve area, by VTI measures 2.13 cm. Pulmonic Valve: The pulmonic valve was normal in structure. Pulmonic valve regurgitation is trivial. No  evidence of pulmonic stenosis. Aorta: The aortic root is normal in size and structure. Venous: The inferior vena cava is dilated in size with less than 50% respiratory variability, suggesting right atrial pressure of 15 mmHg. IAS/Shunts: No atrial level shunt detected by color flow Doppler.  LEFT VENTRICLE PLAX 2D LVIDd:         3.60 cm  Diastology LVIDs:         2.40 cm  LV e' medial:    10.70 cm/s LV PW:         1.00 cm  LV E/e' medial:  7.9 LV IVS:        1.20 cm  LV e' lateral:   11.60 cm/s LVOT diam:     1.90 cm  LV E/e' lateral: 7.3 LV SV:         60 LV SV Index:   34 LVOT Area:     2.84 cm  RIGHT VENTRICLE             IVC RV Basal diam:  3.00 cm     IVC diam: 2.20 cm RV S prime:     14.10 cm/s TAPSE (M-mode): 2.6 cm LEFT ATRIUM             Index       RIGHT ATRIUM           Index LA diam:        2.60 cm 1.48 cm/m  RA Area:     15.80 cm LA Vol (A2C):   40.6 ml 23.10 ml/m RA Volume:   45.40 ml  25.83 ml/m LA Vol (A4C):   42.2 ml 24.01 ml/m LA Biplane Vol: 42.7 ml 24.29 ml/m  AORTIC VALVE AV Area (Vmax):    1.89 cm AV Area (  Vmean):   2.00 cm AV Area (VTI):     2.13 cm AV Vmax:           139.00 cm/s AV Vmean:          92.200 cm/s AV VTI:            0.284 m AV Peak Grad:      7.7 mmHg AV Mean Grad:      4.0 mmHg LVOT Vmax:         92.70 cm/s LVOT Vmean:        64.900 cm/s LVOT VTI:          0.213 m LVOT/AV VTI ratio: 0.75  AORTA Ao Root diam: 2.60 cm Ao Asc diam:  2.70 cm MITRAL VALVE               TRICUSPID VALVE MV Area (PHT): 3.27 cm    TR Peak grad:   20.8 mmHg MV Area VTI:   2.19 cm    TR Vmax:        228.00 cm/s MV Peak grad:  4.2 mmHg MV Mean grad:  2.0 mmHg    SHUNTS MV Vmax:       1.02 m/s    Systemic VTI:  0.21 m MV Vmean:      62.4 cm/s   Systemic Diam: 1.90 cm MV Decel Time: 232 msec MV E velocity: 84.80 cm/s MV A velocity: 51.80 cm/s MV E/A ratio:  1.64 Donato Schultz MD Electronically signed by Donato Schultz MD Signature Date/Time: 05/05/2020/11:23:42 AM    Final     Cardiac Studies      Patient Profile     35 y.o. female with a history of HLD, asthma, tob use, HA, FH CAD who presents with NSTEMI  Assessment & Plan    1. NSTEMI - admitted with chest pain, trop up to 165. Despite young age mulitple CAD risk factors including LDL 282, father CABG in his 30s, tobacco - cath showed prox to mid LAD 80%, prox LX 40%, prox RCA 99%. Received DES to RCA with recs for staged PCI to LAD in few weeks.  - echo LVE 60-65%, no WMAs, normal RV function  - medical therapy with ASA 81, atorva 80, brillinta 90mg  bid. Startlosartan 12.5mg  daily in setting of ACS. Occasional low HRs, would not start beta blocker at this time.    2. Hyperlipidemia LDL 171 this admit, atorva increased from 20mg  to 80mg  daily.   At f/u will need to be set up for staged procedure to her LAD  For questions or updates, please contact [facility] Please consult [email or web address] for contact info under        Signed, , MD  05/06/2020, 11:14 AM

## 2020-05-06 NOTE — TOC Transition Note (Addendum)
Transition of Care [facility]) - CM/SW Discharge Note   Patient Details  Name: Susan Sexton MRN: [id number] Date of Birth: Nov 23, 1985  [facility] CM/SW Contact:  Glennon Mac, RN Phone Number: 05/06/2020, 1:00pm  Clinical Narrative:   Pt s/p NSTEMI; medically stable for discharge home today. Pt uninsured; is discharging home on [place] and several other Rx meds.  30 day free trial card given to patient for Brillinta.  MATCH letter given to patient for other Rx; she will be able to get each dc med for $3/Rx.  Information for [facility] listed on AVS; encouraged patient to call on Monday to set up hospital follow up/PCP appt.      Final next level of care: Home/Self Care Barriers to Discharge: Barriers Resolved                       Discharge Plan and Services   Discharge Planning Services: CM Consult,Medication [facility] Program                                 Social Determinants of Health (SDOH) Interventions     Readmission Risk Interventions No flowsheet data found.  Quintella Baton, RN, BSN  Trauma/Neuro ICU Case Manager [phone]

## 2020-05-06 NOTE — Progress Notes (Addendum)
CARDIAC REHAB PHASE I   PRE:  Rate/Rhythm: Sinus Brady 58  BP:    Sitting: 138/92     SaO2: 100%  MODE:  Ambulation  POST:  Rate/Rhythem: 70  BP:    Sitting: 138/92     SaO2: 100%    0920 -1045 Patient ambulated independently in the hallway without complaints or symptoms of chest pain. Reviewed MI booklet, Exercise instructions, Heart healthy diet with the patient. Said that stent card ws placed on bedside table. Could not locate. Patient's RN notified. Discussed the importance of taking Brillinta, use of sublingual nitroglycerin. When to call 911. Discussed smoking cessation with Patient. [place] says she thinks she is ready to quit. Given 1-800-quit- now phone number. Patient given fake cigarette. Myleah said she is interested in virtual phase 2 cardiac rehab. Obtained cell phone number and E-mail address.Gladstone Lighter, RN,BSN 05/06/2020 10:54 AM   Arta Bruce Harlon Flor

## 2020-05-06 NOTE — Discharge Instructions (Signed)
Heart Attack A heart attack occurs when blood and oxygen supply to the heart is cut off. A heart attack causes damage to the heart that cannot be fixed. A heart attack is also called a myocardial infarction, or MI. If you think you are having a heart attack, do not wait to see if the symptoms will go away. Get medical help right away. What are the causes? This condition may be caused by:  A fatty substance (plaque) in the blood vessels (arteries). This can block the flow of blood to the heart.  A blood clot in the blood vessels that go to the heart. The blood clot blocks blood flow.  Low blood pressure.  An abnormal heartbeat.  Some diseases, such as problems in red blood cells (anemia)orproblems in breathing (respiratory failure).  Tightening (spasm) of a blood vessel that cuts off blood to the heart.  A tear in a blood vessel of the heart.  High blood pressure.   What increases the risk? The following factors may make you more likely to develop this condition:  Aging. The older you are, the higher your risk.  Having a personal or family history of chest pain, heart attack, stroke, or narrowing of the arteries in the legs, arms, head, or stomach (peripheral artery disease).  Being female.  Smoking.  Not getting regular exercise.  Being overweight or obese.  Having high blood pressure.  Having high cholesterol.  Having diabetes.  Drinking too much alcohol.  Using illegal drugs, such as cocaine or methamphetamine. What are the signs or symptoms? Symptoms of this condition include:  Chest pain. It may feel like: ? Crushing or squeezing. ? Tightness, pressure, fullness, or heaviness.  Pain in the arm, neck, jaw, back, or upper body.  Shortness of breath.  Heartburn.  Upset stomach (indigestion).  Feeling like you may vomit (nauseous).  Cold sweats.  Feeling tired.  Sudden light-headedness. How is this treated? A heart attack must be treated as soon as  possible. Treatment may include:  Medicines to: ? Break up or dissolve blood clots. ? Thin blood and help prevent blood clots. ? Treat blood pressure. ? Improve blood flow to the heart. ? Reduce pain. ? Reduce cholesterol.  Procedures to widen a blocked artery and keep it open.  Open heart surgery.  Receiving oxygen.  Making your heart strong again (cardiac rehabilitation) through exercise, education, and counseling.   Follow these instructions at home: Medicines  Take over-the-counter and prescription medicines only as told by your doctor. You may need to take medicine: ? To keep your blood from clotting too easily. ? To control blood pressure. ? To lower cholesterol. ? To control heart rhythms.  Do not take these medicines unless your doctor says it is okay: ? NSAIDs, such as ibuprofen. ? Supplements that have vitamin A, vitamin E, or both. ? Hormone replacement therapy that has estrogen with or without progestin. Lifestyle  Do not use any products that have nicotine or tobacco, such as cigarettes, e-cigarettes, and chewing tobacco. If you need help quitting, ask your doctor.  Avoid secondhand smoke.  Exercise regularly. Ask your doctor about a cardiac rehab program.  Eat heart-healthy foods. Your doctor will tell you what foods to eat.  Stay at a healthy weight.  Lower your stress level.  Do not use illegal drugs.      Alcohol use  Do not drink alcohol if: ? Your doctor tells you not to drink. ? You are pregnant, may be pregnant,  or are planning to become pregnant.  If you drink alcohol: ? Limit how much you use to:  0-1 drink a day for women.  0-2 drinks a day for men. ? Know how much alcohol is in your drink. In the [place] one drink equals one 12 oz bottle of beer (355 mL), one 5 oz glass of wine (148 mL), or one 1 oz glass of hard liquor (44 mL). General instructions  Work with your doctor to treat other problems you may have, such as diabetes or  high blood pressure.  Get screened for depression. Get treatment if needed.  Keep your vaccines up to date. Get the flu shot (influenza vaccine) every year.  Keep all follow-up visits as told by your doctor. This is important. Contact a doctor if:  You feel very sad.  You have trouble doing your daily activities. Get help right away if:  You have sudden, unexplained discomfort in your chest, arms, back, neck, jaw, or upper body.  You have shortness of breath.  You have sudden sweating or clammy skin.  You feel like you may vomit.  You vomit.  You feel tired or weak.  You get light-headed or dizzy.  You feel your heart beating fast.  You feel your heart skipping beats.  You have blood pressure that is higher than 180/120. These symptoms may be an emergency. Do not wait to see if the symptoms will go away. Get medical help right away. Call your local emergency services (911 in the [place] Do not drive yourself to the hospital. Summary  A heart attack occurs when blood and oxygen supply to the heart is cut off.  Do not take NSAIDs unless your doctor says it is okay.  Do not smoke. Avoid secondhand smoke.  Exercise regularly. Ask your doctor about a cardiac rehab program. This information is not intended to replace advice given to you by your health care provider. Make sure you discuss any questions you have with your health care provider. Document Revised: 06/08/2018 Document Reviewed: 06/08/2018 Elsevier Patient Education  2021 [employer]. Medication Changes: - START Aspirin 81mg  once daily and Brilinta 90mg  twice daily. These medications are very important in helping keep the new stent in your heart open. - START Losartan 12.5mg  once daily. - INCREASE Atorvastatin (Lipitor) to 80mg  once daily daily.  - STOP Ibuprofen (Advil). We recommended using Tylenol as needed for pain instead. - Flagyl, Keflex, Pyridium, Imodium, and Zofran were all stopped because you said  you were no longer taking these on admission.  Continue all other medications as directed elsewhere on discharge paperwork.  Post NSTEMI: NO HEAVY LIFTING X 2 WEEKS. NO SEXUAL ACTIVITY X 2 WEEKS. NO DRIVING X 1 WEEK. NO SOAKING BATHS, HOT TUBS, POOLS, ETC., X 7 DAYS.  Radial Site Care: Refer to this sheet in the next few weeks. These instructions provide you with information on caring for yourself after your procedure. Your caregiver may also give you more specific instructions. Your treatment has been planned according to current medical practices, but problems sometimes occur. Call your caregiver if you have any problems or questions after your procedure. HOME CARE INSTRUCTIONS  You may shower the day after the procedure.Remove the bandage (dressing) and gently wash the site with plain soap and water.Gently pat the site dry.   Do not apply powder or lotion to the site.   Do not submerge the affected site in water for 3 to 5 days.   Inspect the site at  least twice daily.   Do not flex or bend the affected arm for 24 hours.   No lifting over 5 pounds (2.3 kg) for 5 days after your procedure.   Do not drive home if you are discharged the same day of the procedure. Have someone else drive you.  What to expect:  Any bruising will usually fade within 1 to 2 weeks.   Blood that collects in the tissue (hematoma) may be painful to the touch. It should usually decrease in size and tenderness within 1 to 2 weeks.  SEEK IMMEDIATE MEDICAL CARE IF:  You have unusual pain at the radial site.   You have redness, warmth, swelling, or pain at the radial site.   You have drainage (other than a small amount of blood on the dressing).   You have chills.   You have a fever or persistent symptoms for more than 72 hours.   You have a fever and your symptoms suddenly get worse.   Your arm becomes pale, cool, tingly, or numb.   You have heavy bleeding from the site. Hold pressure on the  site.

## 2020-05-08 ENCOUNTER — Encounter (HOSPITAL_COMMUNITY): Payer: Self-pay | Admitting: Cardiovascular Disease

## 2020-05-14 NOTE — Progress Notes (Signed)
Cardiology Office Note:    Date:  05/17/2020   ID:  Susan Sexton, DOB 03-19-1985, MRN [id number]  PCP:  System, Provider Not In  Cardiologist:  Little Ishikawa, MD  Electrophysiologist:  None   Referring MD: No ref. provider found   Chief Complaint  Patient presents with  . Coronary Artery Disease    History of Present Illness:    Susan Sexton is a 35 y.o. female with a hx of CAD, hyperlipidemia, tobacco use who presents for hospital follow-up.  She was admitted to [facility] on 05/04/2020 with NSTEMI.  She had presented to med [facility] ED with chest pain.  Found to have mildly elevated troponins (peak 156).  Despite her age, she was noted to have significant CAD risk factors as suspected FH given LDL to 58 and family history including father having CABG in his 4s.  Echocardiogram was done on 05/05/2020 which showed normal biventricular function, no significant valvular disease.  LHC on 05/05/2020 showed 99% proximal RCA stenosis, 50% proximal to mid RCA, 20% distal RCA, 40% proximal to mid LCx, 80% proximal to mid LAD.  RCA disease was treated with DES.  Plan was to return for staged LAD PCI.  Since her discharge, she reports that she has been doing okay.  Denies any further chest pain.  No dyspnea, lightheadedness, syncope, palpitations, or lower extremity edema.  Reports she noted a small amount of blood in her urine one day but has resolved.  She denies any blood in her stool.   Past Medical History:  Diagnosis Date  . Asthma   . Coronary artery disease   . High cholesterol   . Hyperlipidemia LDL goal <100 05/04/2020  . NSTEMI (non-ST elevated myocardial infarction) [facility])     Past Surgical History:  Procedure Laterality Date  . BREAST LUMPECTOMY Left 06/6/219  . CORONARY STENT INTERVENTION N/A 05/05/2020   Procedure: CORONARY STENT INTERVENTION;  Surgeon: Iran Ouch, MD;  [facility];  Service: Cardiovascular;  Laterality: N/A;  . CORONARY  STENT PLACEMENT  05/05/2020  . INCISION AND DRAINAGE BREAST ABSCESS Left 09/2017  . LEFT HEART CATH AND CORONARY ANGIOGRAPHY N/A 05/05/2020   Procedure: LEFT HEART CATH AND CORONARY ANGIOGRAPHY;  Surgeon: Iran Ouch, MD;  [facility];  Service: Cardiovascular;  Laterality: N/A;    Current Medications: Current Meds  Medication Sig  . albuterol (VENTOLIN HFA) 108 (90 Base) MCG/ACT inhaler Inhale 2 puffs into the lungs every 6 (six) hours as needed for shortness of breath.  Marland Kitchen aspirin 81 MG chewable tablet Chew 1 tablet (81 mg total) by mouth daily.  Marland Kitchen atorvastatin (LIPITOR) 80 MG tablet Take 1 tablet (80 mg total) by mouth daily.  Marland Kitchen losartan (COZAAR) 25 MG tablet Take 0.5 tablets (12.5 mg total) by mouth daily.  . Multiple Vitamins-Minerals (MULTI ADULT GUMMIES PO) Take 1 tablet by mouth daily.  . nicotine polacrilex (NICORETTE) 4 MG gum Take 1 each (4 mg total) by mouth as needed for smoking cessation. Take 4mg  every 1-2 hours as needed for 6 weeks, then every 2-4 hours as needed for 3 weeks, then every 4-8 hours for 3 weeks. Avoid food/drink 15 mins before and after use.  . nitroGLYCERIN (NITROSTAT) 0.4 MG SL tablet Place 1 tablet (0.4 mg total) under the tongue every 5 (five) minutes x 3 doses as needed for chest pain.  . ticagrelor (BRILINTA) 90 MG TABS tablet Take 1 tablet (90 mg total) by  mouth 2 (two) times daily.     Allergies:   Patient has no known allergies.   Social History   Socioeconomic History  . Marital status: Single    Spouse name: Not on file  . Number of children: Not on file  . Years of education: Not on file  . Highest education level: Not on file  Occupational History  . Not on file  Tobacco Use  . Smoking status: Current Every Day Smoker    Packs/day: 0.50    Types: Cigarettes  . Smokeless tobacco: Never Used  Vaping Use  . Vaping Use: Some days  Substance and Sexual Activity  . Alcohol use: No  . Drug use: No  . Sexual activity:  Not on file  Other Topics Concern  . Not on file  Social History Narrative  . Not on file   Social Determinants of Health   Financial Resource Strain: Not on file  Food Insecurity: Not on file  Transportation Needs: Not on file  Physical Activity: Not on file  Stress: Not on file  Social Connections: Not on file     Family History: The patient's family history includes Heart attack (age of onset: 19) in her father; Hypertension in her mother.  ROS:   Please see the history of present illness.     All other systems reviewed and are negative.  EKGs/Labs/Other Studies Reviewed:    The following studies were reviewed today:   EKG:  EKG is  ordered today.  The ekg ordered today demonstrates normal sinus rhythm, rate 91, no ST/T abnormalities  Recent Labs: 05/04/2020: TSH 4.381 05/05/2020: ALT 19 05/06/2020: BUN 10; Creatinine, Ser 0.74; Hemoglobin 11.9; Platelets 223; Potassium 3.2; Sodium 136  Recent Lipid Panel    Component Value Date/Time   CHOL 230 (H) 05/05/2020 0012   TRIG 52 05/05/2020 0012   HDL 49 05/05/2020 0012   CHOLHDL 4.7 05/05/2020 0012   VLDL 10 05/05/2020 0012   LDLCALC 171 (H) 05/05/2020 0012    Physical Exam:    VS:  BP 112/64   Pulse 91   Ht 5\' 8"  (1.727 m)   Wt 143 lb 3.2 oz (65 kg)   LMP 04/25/2020   BMI 21.77 kg/m     Wt Readings from Last 3 Encounters:  05/17/20 143 lb 3.2 oz (65 kg)  05/06/20 142 lb 10.2 oz (64.7 kg)  10/19/18 150 lb (68 kg)     GEN:  Well nourished, well developed in no acute distress HEENT: Normal NECK: No JVD; No carotid bruits LYMPHATICS: No lymphadenopathy CARDIAC: RRR, no murmurs, rubs, gallops RESPIRATORY:  Clear to auscultation without rales, wheezing or rhonchi  ABDOMEN: Soft, non-tender, non-distended MUSCULOSKELETAL:  No edema; No deformity  SKIN: Warm and dry NEUROLOGIC:  Alert and oriented x 3 PSYCHIATRIC:  Normal affect   ASSESSMENT:    1. CAD in native artery   2. Pre-procedure lab exam   3.  Hyperlipidemia, unspecified hyperlipidemia type   4. Tobacco use    PLAN:    CAD: Presented with NSTEMI 05/04/2020.  Echocardiogram was done on 05/05/2020 which showed normal biventricular function, no significant valvular disease.  LHC on 05/05/2020 showed 99% proximal RCA stenosis, 50% proximal to mid RCA, 20% distal RCA, 40% proximal to mid LCx, 80% proximal to mid LAD.  RCA disease was treated with DES.  Plan was to return for staged LAD PCI. -Continue aspirin 81 mg daily, ticagrelor 90 mg twice daily -Continue atorvastatin 80 mg daily -Will  schedule staged LAD PCI.  Risks and benefits of cardiac catheterization have been discussed with the patient.  These include bleeding, infection, kidney damage, stroke, heart attack, death.  The patient understands these risks and is willing to proceed.  Hyperlipidemia: Suspect FH given LDL 282 in 01/2020 and family history including father had CABG in 30s.  Was started on atorvastatin 20 mg daily with improvement in LDL to 171 on 2/25.  Atorvastatin dose increased to 80 mg daily at that time.  Recheck lipid panel in 2 months, if not at goal LDL less than 70 will refer to lipid clinic for PCSK9 inhibitor  Tobacco use: Smoked 1 pack/day x 15 years.  Quit smoking since her stent, encouraged continued cessation  RTC in 1 month  Medication Adjustments/Labs and Tests Ordered: Current medicines are reviewed at length with the patient today.  Concerns regarding medicines are outlined above.  Orders Placed This Encounter  Procedures  . Basic metabolic panel  . CBC  . EKG 12-Lead   No orders of the defined types were placed in this encounter.   Patient Instructions     [facility] CARDIOVASCULAR DIVISION [facility] [address] [place] [address] [place] [address] Dept: [phone] Loc: [phone]    Susan Sexton  05/17/2020  You are scheduled for a Cardiac Catheterization on Tuesday, March 15 with Dr.  Lance Muss.  1. Please arrive at the [facility] (Main Entrance A) at [facility]: [address] [place], [place] [address] at 5:30 AM (This time is two hours before your procedure to ensure your preparation). Free valet parking service is available.   Special note: Every effort is made to have your procedure done on time. Please understand that emergencies sometimes delay scheduled procedures.  2. Diet: Do not eat solid foods after midnight.  The patient may have clear liquids until 5am upon the day of the procedure.  3. Labs: BMET, CBC today  You will need a COVID-19  test prior to your procedure. You are scheduled for Friday 3/11 at 2:45 PM. This is a Drive Up Visit at [address] [place], [place] [address]. Someone will direct you to the appropriate testing line. Stay in your car and someone will be with you shortly.  4. Medication instructions in preparation for your procedure:   Contrast Allergy: No  On the morning of your procedure, take your Brilinta/Ticagrelor, and any morning medicines NOT listed above.  You may use sips of water.  5. Plan for one night stay--bring personal belongings. 6. Bring a current list of your medications and current insurance cards. 7. You MUST have a responsible person to drive you home. 8. Someone MUST be with you the first 24 hours after you arrive home or your discharge will be delayed. 9. Please wear clothes that are easy to get on and off and wear slip-on shoes.  Thank you for allowing [place] to care for you!   -- [facility]    Follow up in 1 month with Dr. Bjorn Pippin     Signed, Little Ishikawa, MD  05/17/2020 6:58 PM    [facility]

## 2020-05-14 NOTE — H&P (View-Only) (Signed)
Cardiology Office Note:    Date:  05/17/2020   ID:  Susan Sexton, DOB 03-19-1985, MRN [id number]  PCP:  System, Provider Not In  Cardiologist:  Little Ishikawa, MD  Electrophysiologist:  None   Referring MD: No ref. provider found   Chief Complaint  Patient presents with  . Coronary Artery Disease    History of Present Illness:    Susan Sexton is a 35 y.o. female with a hx of CAD, hyperlipidemia, tobacco use who presents for hospital follow-up.  She was admitted to [facility] on 05/04/2020 with NSTEMI.  She had presented to med [facility] ED with chest pain.  Found to have mildly elevated troponins (peak 156).  Despite her age, she was noted to have significant CAD risk factors as suspected FH given LDL to 58 and family history including father having CABG in his 4s.  Echocardiogram was done on 05/05/2020 which showed normal biventricular function, no significant valvular disease.  LHC on 05/05/2020 showed 99% proximal RCA stenosis, 50% proximal to mid RCA, 20% distal RCA, 40% proximal to mid LCx, 80% proximal to mid LAD.  RCA disease was treated with DES.  Plan was to return for staged LAD PCI.  Since her discharge, she reports that she has been doing okay.  Denies any further chest pain.  No dyspnea, lightheadedness, syncope, palpitations, or lower extremity edema.  Reports she noted a small amount of blood in her urine one day but has resolved.  She denies any blood in her stool.   Past Medical History:  Diagnosis Date  . Asthma   . Coronary artery disease   . High cholesterol   . Hyperlipidemia LDL goal <100 05/04/2020  . NSTEMI (non-ST elevated myocardial infarction) [facility])     Past Surgical History:  Procedure Laterality Date  . BREAST LUMPECTOMY Left 06/6/219  . CORONARY STENT INTERVENTION N/A 05/05/2020   Procedure: CORONARY STENT INTERVENTION;  Surgeon: Iran Ouch, MD;  [facility];  Service: Cardiovascular;  Laterality: N/A;  . CORONARY  STENT PLACEMENT  05/05/2020  . INCISION AND DRAINAGE BREAST ABSCESS Left 09/2017  . LEFT HEART CATH AND CORONARY ANGIOGRAPHY N/A 05/05/2020   Procedure: LEFT HEART CATH AND CORONARY ANGIOGRAPHY;  Surgeon: Iran Ouch, MD;  [facility];  Service: Cardiovascular;  Laterality: N/A;    Current Medications: Current Meds  Medication Sig  . albuterol (VENTOLIN HFA) 108 (90 Base) MCG/ACT inhaler Inhale 2 puffs into the lungs every 6 (six) hours as needed for shortness of breath.  Marland Kitchen aspirin 81 MG chewable tablet Chew 1 tablet (81 mg total) by mouth daily.  Marland Kitchen atorvastatin (LIPITOR) 80 MG tablet Take 1 tablet (80 mg total) by mouth daily.  Marland Kitchen losartan (COZAAR) 25 MG tablet Take 0.5 tablets (12.5 mg total) by mouth daily.  . Multiple Vitamins-Minerals (MULTI ADULT GUMMIES PO) Take 1 tablet by mouth daily.  . nicotine polacrilex (NICORETTE) 4 MG gum Take 1 each (4 mg total) by mouth as needed for smoking cessation. Take 4mg  every 1-2 hours as needed for 6 weeks, then every 2-4 hours as needed for 3 weeks, then every 4-8 hours for 3 weeks. Avoid food/drink 15 mins before and after use.  . nitroGLYCERIN (NITROSTAT) 0.4 MG SL tablet Place 1 tablet (0.4 mg total) under the tongue every 5 (five) minutes x 3 doses as needed for chest pain.  . ticagrelor (BRILINTA) 90 MG TABS tablet Take 1 tablet (90 mg total) by  mouth 2 (two) times daily.     Allergies:   Patient has no known allergies.   Social History   Socioeconomic History  . Marital status: Single    Spouse name: Not on file  . Number of children: Not on file  . Years of education: Not on file  . Highest education level: Not on file  Occupational History  . Not on file  Tobacco Use  . Smoking status: Current Every Day Smoker    Packs/day: 0.50    Types: Cigarettes  . Smokeless tobacco: Never Used  Vaping Use  . Vaping Use: Some days  Substance and Sexual Activity  . Alcohol use: No  . Drug use: No  . Sexual activity:  Not on file  Other Topics Concern  . Not on file  Social History Narrative  . Not on file   Social Determinants of Health   Financial Resource Strain: Not on file  Food Insecurity: Not on file  Transportation Needs: Not on file  Physical Activity: Not on file  Stress: Not on file  Social Connections: Not on file     Family History: The patient's family history includes Heart attack (age of onset: 19) in her father; Hypertension in her mother.  ROS:   Please see the history of present illness.     All other systems reviewed and are negative.  EKGs/Labs/Other Studies Reviewed:    The following studies were reviewed today:   EKG:  EKG is  ordered today.  The ekg ordered today demonstrates normal sinus rhythm, rate 91, no ST/T abnormalities  Recent Labs: 05/04/2020: TSH 4.381 05/05/2020: ALT 19 05/06/2020: BUN 10; Creatinine, Ser 0.74; Hemoglobin 11.9; Platelets 223; Potassium 3.2; Sodium 136  Recent Lipid Panel    Component Value Date/Time   CHOL 230 (H) 05/05/2020 0012   TRIG 52 05/05/2020 0012   HDL 49 05/05/2020 0012   CHOLHDL 4.7 05/05/2020 0012   VLDL 10 05/05/2020 0012   LDLCALC 171 (H) 05/05/2020 0012    Physical Exam:    VS:  BP 112/64   Pulse 91   Ht 5\' 8"  (1.727 m)   Wt 143 lb 3.2 oz (65 kg)   LMP 04/25/2020   BMI 21.77 kg/m     Wt Readings from Last 3 Encounters:  05/17/20 143 lb 3.2 oz (65 kg)  05/06/20 142 lb 10.2 oz (64.7 kg)  10/19/18 150 lb (68 kg)     GEN:  Well nourished, well developed in no acute distress HEENT: Normal NECK: No JVD; No carotid bruits LYMPHATICS: No lymphadenopathy CARDIAC: RRR, no murmurs, rubs, gallops RESPIRATORY:  Clear to auscultation without rales, wheezing or rhonchi  ABDOMEN: Soft, non-tender, non-distended MUSCULOSKELETAL:  No edema; No deformity  SKIN: Warm and dry NEUROLOGIC:  Alert and oriented x 3 PSYCHIATRIC:  Normal affect   ASSESSMENT:    1. CAD in native artery   2. Pre-procedure lab exam   3.  Hyperlipidemia, unspecified hyperlipidemia type   4. Tobacco use    PLAN:    CAD: Presented with NSTEMI 05/04/2020.  Echocardiogram was done on 05/05/2020 which showed normal biventricular function, no significant valvular disease.  LHC on 05/05/2020 showed 99% proximal RCA stenosis, 50% proximal to mid RCA, 20% distal RCA, 40% proximal to mid LCx, 80% proximal to mid LAD.  RCA disease was treated with DES.  Plan was to return for staged LAD PCI. -Continue aspirin 81 mg daily, ticagrelor 90 mg twice daily -Continue atorvastatin 80 mg daily -Will  schedule staged LAD PCI.  Risks and benefits of cardiac catheterization have been discussed with the patient.  These include bleeding, infection, kidney damage, stroke, heart attack, death.  The patient understands these risks and is willing to proceed.  Hyperlipidemia: Suspect FH given LDL 282 in 01/2020 and family history including father had CABG in 30s.  Was started on atorvastatin 20 mg daily with improvement in LDL to 171 on 2/25.  Atorvastatin dose increased to 80 mg daily at that time.  Recheck lipid panel in 2 months, if not at goal LDL less than 70 will refer to lipid clinic for PCSK9 inhibitor  Tobacco use: Smoked 1 pack/day x 15 years.  Quit smoking since her stent, encouraged continued cessation  RTC in 1 month  Medication Adjustments/Labs and Tests Ordered: Current medicines are reviewed at length with the patient today.  Concerns regarding medicines are outlined above.  Orders Placed This Encounter  Procedures  . Basic metabolic panel  . CBC  . EKG 12-Lead   No orders of the defined types were placed in this encounter.   Patient Instructions     [place] CARDI[facility]ASCULAR [facility] [address] [place] [address] Dept: [phone] Loc: [phone]    Mady S Bontempo  05/17/2020  You are scheduled for a Cardiac Catheterization on Tuesday, March 15 with Dr.  Jayadeep Varanasi.  1. Please arrive at the [place] (Main Entrance A) at [facility]: [address] [place], [place] [address] at 5:30 AM (This time is two hours before your procedure to ensure your preparation). Free valet parking service is available.   Special note: Every effort is made to have your procedure done on time. Please understand that emergencies sometimes delay scheduled procedures.  2. Diet: Do not eat solid foods after midnight.  The patient may have clear liquids until 5am upon the day of the procedure.  3. Labs: BMET, CBC today  You will need a COVID-19  test prior to your procedure. You are scheduled for Friday 3/11 at 2:45 PM. This is a Drive Up Visit at [address] [place], [place] [address]. Someone will direct you to the appropriate testing line. Stay in your car and someone will be with you shortly.  4. Medication instructions in preparation for your procedure:   Contrast Allergy: No  On the morning of your procedure, take your Brilinta/Ticagrelor, and any morning medicines NOT listed above.  You may use sips of water.  5. Plan for one night stay--bring personal belongings. 6. Bring a current list of your medications and current insurance cards. 7. You MUST have a responsible person to drive you home. 8. Someone MUST be with you the first 24 hours after you arrive home or your discharge will be delayed. 9. Please wear clothes that are easy to get on and off and wear slip-on shoes.  Thank you for allowing us to care for you!   -- [facility]    Follow up in 1 month with Dr. Kaiea Esselman     Signed, Saoirse Legere L Valleri Hendricksen, MD  05/17/2020 6:58 PM    [facility] 

## 2020-05-15 ENCOUNTER — Telehealth (HOSPITAL_COMMUNITY): Payer: Self-pay

## 2020-05-15 NOTE — Telephone Encounter (Signed)
Called patient to see if she is interested in the Cardiac Rehab Program. Patient expressed interest. Explained scheduling process and advised pt about virtual cardiac rehab, patient verbalized understanding. Will contact patient for scheduling once f/u has been completed.

## 2020-05-17 ENCOUNTER — Ambulatory Visit (INDEPENDENT_AMBULATORY_CARE_PROVIDER_SITE_OTHER): Payer: Self-pay | Admitting: Cardiology

## 2020-05-17 ENCOUNTER — Encounter: Payer: Self-pay | Admitting: Cardiology

## 2020-05-17 ENCOUNTER — Other Ambulatory Visit: Payer: Self-pay

## 2020-05-17 VITALS — BP 112/64 | HR 91 | Ht 68.0 in | Wt 143.2 lb

## 2020-05-17 DIAGNOSIS — E785 Hyperlipidemia, unspecified: Secondary | ICD-10-CM

## 2020-05-17 DIAGNOSIS — Z720 Tobacco use: Secondary | ICD-10-CM

## 2020-05-17 DIAGNOSIS — I2510 Atherosclerotic heart disease of native coronary artery without angina pectoris: Secondary | ICD-10-CM

## 2020-05-17 DIAGNOSIS — Z01812 Encounter for preprocedural laboratory examination: Secondary | ICD-10-CM

## 2020-05-17 NOTE — Patient Instructions (Signed)
    [place] [facility] CARDIOVASCULAR DIVISION [facility] [address] [place] [address] Dept: [phone] Loc: [phone]    Susan Sexton  05/17/2020  You are scheduled for a Cardiac Catheterization on Tuesday, March 15 with Dr. Lance Muss.  1. Please arrive at the [facility] (Main Entrance A) at [facility]: [address] [place], [place] [address] at 5:30 AM (This time is two hours before your procedure to ensure your preparation). Free valet parking service is available.   Special note: Every effort is made to have your procedure done on time. Please understand that emergencies sometimes delay scheduled procedures.  2. Diet: Do not eat solid foods after midnight.  The patient may have clear liquids until 5am upon the day of the procedure.  3. Labs: BMET, CBC today  You will need a COVID-19  test prior to your procedure. You are scheduled for Friday 3/11 at 2:45 PM. This is a Drive Up Visit at [address] [place], [place] [address]. Someone will direct you to the appropriate testing line. Stay in your car and someone will be with you shortly.  4. Medication instructions in preparation for your procedure:   Contrast Allergy: No  On the morning of your procedure, take your Brilinta/Ticagrelor, and any morning medicines NOT listed above.  You may use sips of water.  5. Plan for one night stay--bring personal belongings. 6. Bring a current list of your medications and current insurance cards. 7. You MUST have a responsible person to drive you home. 8. Someone MUST be with you the first 24 hours after you arrive home or your discharge will be delayed. 9. Please wear clothes that are easy to get on and off and wear slip-on shoes.  Thank you for allowing [place] to care for you!   -- [facility] [facility]    Follow up in 1 month with Dr. Bjorn Pippin

## 2020-05-18 LAB — CBC
Hematocrit: 40 % (ref 34.0–46.6)
Hemoglobin: 13.7 g/dL (ref 11.1–15.9)
MCH: 32.3 pg (ref 26.6–33.0)
MCHC: 34.3 g/dL (ref 31.5–35.7)
MCV: 94 fL (ref 79–97)
Platelets: 252 10*3/uL (ref 150–450)
RBC: 4.24 x10E6/uL (ref 3.77–5.28)
RDW: 12.2 % (ref 11.7–15.4)
WBC: 10.8 10*3/uL (ref 3.4–10.8)

## 2020-05-18 LAB — BASIC METABOLIC PANEL
BUN/Creatinine Ratio: 12 (ref 9–23)
BUN: 11 mg/dL (ref 6–20)
CO2: 22 mmol/L (ref 20–29)
Calcium: 10.3 mg/dL — ABNORMAL HIGH (ref 8.7–10.2)
Chloride: 99 mmol/L (ref 96–106)
Creatinine, Ser: 0.91 mg/dL (ref 0.57–1.00)
Glucose: 65 mg/dL (ref 65–99)
Potassium: 3.9 mmol/L (ref 3.5–5.2)
Sodium: 139 mmol/L (ref 134–144)
eGFR: 85 mL/min/{1.73_m2} (ref >59)

## 2020-05-19 ENCOUNTER — Other Ambulatory Visit (HOSPITAL_COMMUNITY)
Admission: RE | Admit: 2020-05-19 | Discharge: 2020-05-19 | Disposition: A | Discharge status: Home / Self Care | Payer: Self-pay | Source: Ambulatory Visit | Attending: Interventional Cardiology | Admitting: Interventional Cardiology

## 2020-05-19 DIAGNOSIS — Z01812 Encounter for preprocedural laboratory examination: Secondary | ICD-10-CM | POA: Insufficient documentation

## 2020-05-19 DIAGNOSIS — Z20822 Contact with and (suspected) exposure to covid-19: Secondary | ICD-10-CM | POA: Insufficient documentation

## 2020-05-19 LAB — SARS CORONAVIRUS 2 (TAT 6-24 HRS): SARS Coronavirus 2: NEGATIVE

## 2020-05-22 ENCOUNTER — Telehealth: Payer: Self-pay | Admitting: *Deleted

## 2020-05-22 NOTE — Telephone Encounter (Addendum)
Pt contacted pre-catheterization scheduled at [facility] for: Tuesday May 23, 2020 7:30 AM Verified arrival time and place: [facility] Main Entrance A [facility]) at: 5:30 AM   No solid food after midnight prior to cath, clear liquids until 5 AM day of procedure.   AM meds can be  taken pre-cath with sips of water including: ASA 81 mg * see note below Brilinta 90 mg  Confirmed patient has responsible adult to drive home post procedure and be with patient first 24 hours after arriving home: yes  You are allowed ONE visitor in the waiting room during the time you are at the hospital for your procedure. Both you and your visitor must wear a mask once you enter the hospital.   Reviewed procedure/mask/visitor instructions with patient.  * Patient reports that she has not been taking aspirin 81 mg, she has been taking Brilinta twice a day. Patient instructed  to start taking aspirin 81 mg daily today and I emphasized the importance of taking aspirin 81 mg and Brilinta prior to leaving to go to hospital for procedure in the morning.  I will forward to Dr Bjorn Pippin and Dr Eldridge Dace for review.

## 2020-05-23 ENCOUNTER — Other Ambulatory Visit: Payer: Self-pay

## 2020-05-23 ENCOUNTER — Observation Stay (HOSPITAL_COMMUNITY)
Admission: RE | Admit: 2020-05-23 | Discharge: 2020-05-24 | Disposition: A | Discharge status: Home / Self Care | Payer: Self-pay | Attending: Interventional Cardiology | Admitting: Interventional Cardiology

## 2020-05-23 ENCOUNTER — Ambulatory Visit (HOSPITAL_COMMUNITY)
Admission: RE | Disposition: A | Discharge status: Home / Self Care | Payer: Self-pay | Source: Home / Self Care | Attending: Interventional Cardiology

## 2020-05-23 ENCOUNTER — Encounter (HOSPITAL_COMMUNITY): Payer: Self-pay | Admitting: Interventional Cardiology

## 2020-05-23 DIAGNOSIS — I25118 Atherosclerotic heart disease of native coronary artery with other forms of angina pectoris: Secondary | ICD-10-CM

## 2020-05-23 DIAGNOSIS — E785 Hyperlipidemia, unspecified: Secondary | ICD-10-CM | POA: Diagnosis present

## 2020-05-23 DIAGNOSIS — I214 Non-ST elevation (NSTEMI) myocardial infarction: Secondary | ICD-10-CM | POA: Insufficient documentation

## 2020-05-23 DIAGNOSIS — Z79899 Other long term (current) drug therapy: Secondary | ICD-10-CM | POA: Insufficient documentation

## 2020-05-23 DIAGNOSIS — I200 Unstable angina: Principal | ICD-10-CM | POA: Diagnosis present

## 2020-05-23 DIAGNOSIS — J45909 Unspecified asthma, uncomplicated: Secondary | ICD-10-CM | POA: Insufficient documentation

## 2020-05-23 DIAGNOSIS — Z955 Presence of coronary angioplasty implant and graft: Secondary | ICD-10-CM

## 2020-05-23 DIAGNOSIS — Z720 Tobacco use: Secondary | ICD-10-CM | POA: Diagnosis present

## 2020-05-23 DIAGNOSIS — Z7951 Long term (current) use of inhaled steroids: Secondary | ICD-10-CM | POA: Insufficient documentation

## 2020-05-23 HISTORY — PX: CORONARY STENT INTERVENTION: CATH118234

## 2020-05-23 HISTORY — PX: INTRAVASCULAR ULTRASOUND/IVUS: CATH118244

## 2020-05-23 HISTORY — PX: LEFT HEART CATH AND CORONARY ANGIOGRAPHY: CATH118249

## 2020-05-23 LAB — POCT ACTIVATED CLOTTING TIME
Activated Clotting Time: 529 seconds
Activated Clotting Time: 511 seconds

## 2020-05-23 LAB — PREGNANCY, URINE: Preg Test, Ur: NEGATIVE

## 2020-05-23 SURGERY — CORONARY STENT INTERVENTION
Anesthesia: LOCAL

## 2020-05-23 MED ORDER — HEPARIN (PORCINE) IN NACL 1000-0.9 UT/500ML-% IV SOLN
INTRAVENOUS | Status: DC | PRN
  Administered 2020-05-23 (×2): 500 mL

## 2020-05-23 MED ORDER — IOHEXOL 350 MG/ML SOLN
INTRAVENOUS | Status: DC | PRN
  Administered 2020-05-23: 130 mL

## 2020-05-23 MED ORDER — MIDAZOLAM HCL 2 MG/2ML IJ SOLN
INTRAMUSCULAR | Status: AC
  Filled 2020-05-23: qty 2

## 2020-05-23 MED ORDER — SODIUM CHLORIDE 0.9 % WEIGHT BASED INFUSION: 1.0000 mL/kg/h | INTRAVENOUS | Status: DC

## 2020-05-23 MED ORDER — SODIUM CHLORIDE 0.9% FLUSH
3.0000 mL | Freq: Two times a day (BID) | INTRAVENOUS | Status: DC
Start: 2020-05-23 — End: 2020-05-24
  Administered 2020-05-23 – 2020-05-24 (×3): 3 mL via INTRAVENOUS

## 2020-05-23 MED ORDER — HEPARIN (PORCINE) IN NACL 1000-0.9 UT/500ML-% IV SOLN
INTRAVENOUS | Status: AC
  Filled 2020-05-23: qty 1000

## 2020-05-23 MED ORDER — ATORVASTATIN CALCIUM 80 MG PO TABS
80.0000 mg | ORAL_TABLET | Freq: Every day | ORAL | Status: DC
  Administered 2020-05-23 – 2020-05-24 (×2): 80 mg via ORAL
  Filled 2020-05-23 (×2): qty 1

## 2020-05-23 MED ORDER — ASPIRIN 81 MG PO CHEW: 81.0000 mg | CHEWABLE_TABLET | ORAL | Status: DC

## 2020-05-23 MED ORDER — ASPIRIN 81 MG PO CHEW: 81.0000 mg | CHEWABLE_TABLET | Freq: Every day | ORAL | Status: DC

## 2020-05-23 MED ORDER — LIDOCAINE HCL (PF) 1 % IJ SOLN
INTRAMUSCULAR | Status: AC
  Filled 2020-05-23: qty 30

## 2020-05-23 MED ORDER — HEPARIN SODIUM (PORCINE) 1000 UNIT/ML IJ SOLN
INTRAMUSCULAR | Status: DC | PRN
  Administered 2020-05-23: 9000 [IU] via INTRAVENOUS

## 2020-05-23 MED ORDER — SODIUM CHLORIDE 0.9% FLUSH: 3.0000 mL | Freq: Two times a day (BID) | INTRAVENOUS | Status: DC

## 2020-05-23 MED ORDER — HYDRALAZINE HCL 20 MG/ML IJ SOLN: 10.0000 mg | INTRAMUSCULAR | Status: AC | PRN

## 2020-05-23 MED ORDER — TICAGRELOR 90 MG PO TABS
90.0000 mg | ORAL_TABLET | Freq: Two times a day (BID) | ORAL | Status: DC
  Administered 2020-05-23 – 2020-05-24 (×2): 90 mg via ORAL
  Filled 2020-05-23 (×3): qty 1

## 2020-05-23 MED ORDER — SODIUM CHLORIDE 0.9 % WEIGHT BASED INFUSION
3.0000 mL/kg/h | INTRAVENOUS | Status: DC
  Administered 2020-05-23: 3 mL/kg/h via INTRAVENOUS

## 2020-05-23 MED ORDER — FENTANYL CITRATE (PF) 100 MCG/2ML IJ SOLN
INTRAMUSCULAR | Status: DC | PRN
  Administered 2020-05-23 (×4): 25 ug via INTRAVENOUS

## 2020-05-23 MED ORDER — SODIUM CHLORIDE 0.9% FLUSH: 3.0000 mL | INTRAVENOUS | Status: DC | PRN

## 2020-05-23 MED ORDER — SODIUM CHLORIDE 0.9 % IV SOLN: INTRAVENOUS | Status: AC

## 2020-05-23 MED ORDER — ASPIRIN 81 MG PO CHEW
81.0000 mg | CHEWABLE_TABLET | Freq: Every day | ORAL | Status: DC
  Administered 2020-05-24: 81 mg via ORAL
  Filled 2020-05-23: qty 1

## 2020-05-23 MED ORDER — VERAPAMIL HCL 2.5 MG/ML IV SOLN
INTRAVENOUS | Status: DC | PRN
  Administered 2020-05-23: 10 mL via INTRA_ARTERIAL

## 2020-05-23 MED ORDER — ZOLPIDEM TARTRATE 5 MG PO TABS
5.0000 mg | ORAL_TABLET | Freq: Every evening | ORAL | Status: DC | PRN
  Administered 2020-05-23: 5 mg via ORAL
  Filled 2020-05-23: qty 1

## 2020-05-23 MED ORDER — HEPARIN SODIUM (PORCINE) 1000 UNIT/ML IJ SOLN
INTRAMUSCULAR | Status: AC
  Filled 2020-05-23: qty 1

## 2020-05-23 MED ORDER — LIDOCAINE HCL (PF) 1 % IJ SOLN
INTRAMUSCULAR | Status: DC | PRN
  Administered 2020-05-23: 2 mL

## 2020-05-23 MED ORDER — FENTANYL CITRATE (PF) 100 MCG/2ML IJ SOLN
INTRAMUSCULAR | Status: AC
  Filled 2020-05-23: qty 2

## 2020-05-23 MED ORDER — SODIUM CHLORIDE 0.9 % IV SOLN: 250.0000 mL | INTRAVENOUS | Status: DC | PRN

## 2020-05-23 MED ORDER — SODIUM CHLORIDE 0.9% FLUSH
3.0000 mL | Freq: Two times a day (BID) | INTRAVENOUS | Status: DC
  Administered 2020-05-23: 3 mL via INTRAVENOUS

## 2020-05-23 MED ORDER — ONDANSETRON HCL 4 MG/2ML IJ SOLN: 4.0000 mg | Freq: Four times a day (QID) | INTRAMUSCULAR | Status: DC | PRN

## 2020-05-23 MED ORDER — ALBUTEROL SULFATE (2.5 MG/3ML) 0.083% IN NEBU: 3.0000 mL | INHALATION_SOLUTION | Freq: Four times a day (QID) | RESPIRATORY_TRACT | Status: DC | PRN

## 2020-05-23 MED ORDER — NITROGLYCERIN 0.4 MG SL SUBL: 0.4000 mg | SUBLINGUAL_TABLET | SUBLINGUAL | Status: DC | PRN

## 2020-05-23 MED ORDER — MIDAZOLAM HCL 2 MG/2ML IJ SOLN
INTRAMUSCULAR | Status: DC | PRN
  Administered 2020-05-23 (×3): 1 mg via INTRAVENOUS
  Administered 2020-05-23: 2 mg via INTRAVENOUS

## 2020-05-23 MED ORDER — ALPRAZOLAM 0.25 MG PO TABS: 0.2500 mg | ORAL_TABLET | Freq: Two times a day (BID) | ORAL | Status: DC | PRN

## 2020-05-23 MED ORDER — VERAPAMIL HCL 2.5 MG/ML IV SOLN
INTRAVENOUS | Status: AC
  Filled 2020-05-23: qty 2

## 2020-05-23 MED ORDER — LABETALOL HCL 5 MG/ML IV SOLN: 10.0000 mg | INTRAVENOUS | Status: AC | PRN

## 2020-05-23 MED ORDER — NITROGLYCERIN 1 MG/10 ML FOR IR/CATH LAB
INTRA_ARTERIAL | Status: AC
  Filled 2020-05-23: qty 10

## 2020-05-23 MED ORDER — TICAGRELOR 90 MG PO TABS: 90.0000 mg | ORAL_TABLET | Freq: Two times a day (BID) | ORAL | Status: DC

## 2020-05-23 MED ORDER — ACETAMINOPHEN 325 MG PO TABS
650.0000 mg | ORAL_TABLET | ORAL | Status: DC | PRN
  Administered 2020-05-23 – 2020-05-24 (×2): 650 mg via ORAL
  Filled 2020-05-23 (×2): qty 2

## 2020-05-23 MED ORDER — NICOTINE POLACRILEX 2 MG MT GUM
4.0000 mg | CHEWING_GUM | OROMUCOSAL | Status: DC | PRN
  Filled 2020-05-23: qty 2

## 2020-05-23 MED ORDER — TICAGRELOR 90 MG PO TABS: 90.0000 mg | ORAL_TABLET | ORAL | Status: DC

## 2020-05-23 MED ORDER — ENOXAPARIN SODIUM 40 MG/0.4ML ~~LOC~~ SOLN
40.0000 mg | SUBCUTANEOUS | Status: DC
  Administered 2020-05-24: 40 mg via SUBCUTANEOUS
  Filled 2020-05-23: qty 0.4

## 2020-05-23 MED ORDER — LOSARTAN POTASSIUM 25 MG PO TABS
12.5000 mg | ORAL_TABLET | Freq: Every day | ORAL | Status: DC
  Administered 2020-05-23 – 2020-05-24 (×2): 12.5 mg via ORAL
  Filled 2020-05-23 (×2): qty 1

## 2020-05-23 SURGICAL SUPPLY — 22 items
BALLN SAPPHIRE ~~LOC~~ 4.0X18 (BALLOONS) ×2 IMPLANT
BALLN ~~LOC~~ EMERGE MR 3.75X15 (BALLOONS) ×2
BALLOON ~~LOC~~ EMERGE MR 3.75X15 (BALLOONS) ×1 IMPLANT
CATH INFINITI JR4 5F (CATHETERS) ×2 IMPLANT
CATH LAUNCHER 6FR EBU3.5 (CATHETERS) ×2 IMPLANT
CATH OPTICROSS HD (CATHETERS) ×2 IMPLANT
COVER SWIFTLINK CONNECTOR (BAG) ×2 IMPLANT
DEVICE RAD TR BAND REGULAR (VASCULAR PRODUCTS) ×2 IMPLANT
GLIDESHEATH SLEND SS 6F .021 (SHEATH) ×2 IMPLANT
GUIDEWIRE INQWIRE 1.5J.035X260 (WIRE) ×1 IMPLANT
INQWIRE 1.5J .035X260CM (WIRE) ×2
KIT ENCORE 26 ADVANTAGE (KITS) ×2 IMPLANT
KIT HEART LEFT (KITS) ×2 IMPLANT
KIT HEMO VALVE WATCHDOG (MISCELLANEOUS) ×2 IMPLANT
PACK CARDIAC CATHETERIZATION (CUSTOM PROCEDURE TRAY) ×2 IMPLANT
SLED PULL BACK IVUS (MISCELLANEOUS) ×2 IMPLANT
STENT RESOLUTE ONYX 3.5X12 (Permanent Stent) ×2 IMPLANT
STENT RESOLUTE ONYX 3.5X30 (Permanent Stent) ×2 IMPLANT
STENT RESOLUTE ONYX 3.5X8 (Permanent Stent) ×2 IMPLANT
TRANSDUCER W/STOPCOCK (MISCELLANEOUS) ×2 IMPLANT
TUBING CIL FLEX 10 FLL-RA (TUBING) ×2 IMPLANT
WIRE ASAHI PROWATER 180CM (WIRE) ×2 IMPLANT

## 2020-05-23 NOTE — Interval H&P Note (Signed)
Cath Lab Visit (complete for each Cath Lab visit)  Clinical Evaluation Leading to the Procedure:   ACS: Yes.    Non-ACS:    Anginal Classification: CCS IV  Anti-ischemic medical therapy: Minimal Therapy (1 class of medications)  Non-Invasive Test Results: No non-invasive testing performed  Prior CABG: No previous CABG   LAD disease noted in the setting of RCA NSTEMI.   History and Physical Interval Note:  05/23/2020 7:43 AM  Susan Sexton  has presented today for surgery, with the diagnosis of CAD.  The various methods of treatment have been discussed with the patient and family. After consideration of risks, benefits and other options for treatment, the patient has consented to  Procedure(s): CORONARY STENT INTERVENTION (N/A) as a surgical intervention.  The patient's history has been reviewed, patient examined, no change in status, stable for surgery.  I have reviewed the patient's chart and labs.  Questions were answered to the patient's satisfaction.     Lance Muss

## 2020-05-24 ENCOUNTER — Encounter (HOSPITAL_COMMUNITY): Payer: Self-pay | Admitting: Interventional Cardiology

## 2020-05-24 ENCOUNTER — Other Ambulatory Visit: Payer: Self-pay | Admitting: Interventional Cardiology

## 2020-05-24 DIAGNOSIS — I25110 Atherosclerotic heart disease of native coronary artery with unstable angina pectoris: Secondary | ICD-10-CM

## 2020-05-24 DIAGNOSIS — Z955 Presence of coronary angioplasty implant and graft: Secondary | ICD-10-CM

## 2020-05-24 DIAGNOSIS — E785 Hyperlipidemia, unspecified: Secondary | ICD-10-CM

## 2020-05-24 DIAGNOSIS — I200 Unstable angina: Secondary | ICD-10-CM

## 2020-05-24 DIAGNOSIS — Z720 Tobacco use: Secondary | ICD-10-CM

## 2020-05-24 LAB — CBC
HCT: 34.6 % — ABNORMAL LOW (ref 36.0–46.0)
Hemoglobin: 12.4 g/dL (ref 12.0–15.0)
MCH: 32.5 pg (ref 26.0–34.0)
MCHC: 35.8 g/dL (ref 30.0–36.0)
MCV: 90.8 fL (ref 80.0–100.0)
Platelets: 229 10*3/uL (ref 150–400)
RBC: 3.81 MIL/uL — ABNORMAL LOW (ref 3.87–5.11)
RDW: 12.3 % (ref 11.5–15.5)
WBC: 10.7 10*3/uL — ABNORMAL HIGH (ref 4.0–10.5)
nRBC: 0 % (ref 0.0–0.2)

## 2020-05-24 LAB — BASIC METABOLIC PANEL
Anion gap: 9 (ref 5–15)
BUN: 7 mg/dL (ref 6–20)
CO2: 22 mmol/L (ref 22–32)
Calcium: 9.3 mg/dL (ref 8.9–10.3)
Chloride: 105 mmol/L (ref 98–111)
Creatinine, Ser: 0.66 mg/dL (ref 0.44–1.00)
GFR, Estimated: >60 mL/min (ref >60)
Glucose, Bld: 103 mg/dL — ABNORMAL HIGH (ref 70–99)
Potassium: 3.6 mmol/L (ref 3.5–5.1)
Sodium: 136 mmol/L (ref 135–145)

## 2020-05-24 MED ORDER — METOPROLOL SUCCINATE ER 25 MG PO TB24
12.5000 mg | ORAL_TABLET | Freq: Every day | ORAL | Status: DC
  Administered 2020-05-24: 12.5 mg via ORAL
  Filled 2020-05-24: qty 1

## 2020-05-24 MED ORDER — METOPROLOL SUCCINATE ER 25 MG PO TB24
12.5000 mg | ORAL_TABLET | Freq: Every day | ORAL | 11 refills | Status: DC
  Filled 2020-11-28 (×2): qty 15, 30d supply, fill #0
  Filled 2021-02-09: qty 15, 30d supply, fill #1

## 2020-05-24 MED FILL — Nitroglycerin IV Soln 100 MCG/ML in D5W: INTRA_ARTERIAL | Qty: 10 | Status: AC

## 2020-05-24 NOTE — Plan of Care (Signed)
  Problem: Activity: Goal: Ability to return to baseline activity level will improve Outcome: Progressing   Problem: Cardiovascular: Goal: Vascular access site(s) Level 0-1 will be maintained Outcome: Progressing   Problem: Clinical Measurements: Goal: Respiratory complications will improve Outcome: Progressing   Problem: Activity: Goal: Risk for activity intolerance will decrease Outcome: Progressing   Problem: Nutrition: Goal: Adequate nutrition will be maintained Outcome: Progressing   Problem: Coping: Goal: Level of anxiety will decrease Outcome: Progressing

## 2020-05-24 NOTE — Discharge Summary (Addendum)
Discharge Summary    Patient ID: Susan Sexton MRN: [id number]; DOB: [birth year]  Admit date: 05/23/2020 Discharge date: 05/24/2020  PCP:  System, Provider Not In   [facility]  Cardiologist:  Little Ishikawahristopher L Schumann, MD  Advanced Practice Provider:  No care team member to display Electrophysiologist:  None    Discharge Diagnoses    Principal Problem:   Unstable angina [facility](HCC) Active Problems:   Tobacco abuse   Hyperlipidemia LDL goal <100   S/P drug eluting coronary stent placement  Diagnostic Studies/Procedures    Cath: 05/23/20   Prox Cx to Mid Cx lesion is 40% stenosed.  Dist RCA lesion is 20% stenosed.  Previously placed Prox RCA drug eluting stent is widely patent.  Previously placed Prox RCA to Mid RCA drug eluting stent is widely patent.  Prox LAD to Mid LAD lesion is 80% stenosed. A drug-eluting stent was successfully placed using a STENT RESOLUTE ONYX 3.5X30, postdilated to 3.75 mm and optimized with intravascular ultrasound.  Post intervention, there is a 0% residual stenosis.  Mid LAD lesion is 75% stenosed in a very tortuous vessel, at the distal edge of the prior stent, likely representing edge dissection. A drug-eluting stent was successfully placed using a STENT RESOLUTE ONYX 3.5X8., Postdilated to 3.75 mm and optimized with intravascular ultrasound.  Post intervention, there is a 0% residual stenosis.  Mid LM lesion is 60% stenosed, likely guide catheter dissection. A drug-eluting stent was successfully placed using a STENT RESOLUTE ONYX 3.5X12, postdilated to greater than 4 mm in diameter and optimized with intravascular ultrasound.  Post intervention, there is a 0% residual stenosis.  LV end diastolic pressure is normal.  There is no aortic valve stenosis.   Continue dual antiplatelet therapy for 12 months.  Given the length of stented segments in both her LAD, left main and RCA, would continue clopidogrel monotherapy after  the 1 year of dual antiplatelet therapy.  She needs aggressive secondary prevention.  We talked about smoking cessation as well.   Diagnostic Dominance: Right    Intervention     _____________   History of Present Illness     Susan Sexton is a 35 y.o. female with hx of CAD, hyperlipidemia, tobacco use who presented to the office on 3/9 for hospital follow-up.  She [facility]2020 with NSTEMI.  She [facility] with [facility].  Found to have mildly elevated troponins (peak 156).  Despite her age, she was noted to have significant CAD risk factors as suspected FH given LDL to 8182 and family history including father having CABG in his 2830s.  Echocardiogram was done on 05/05/2020 which showed normal biventricular function, no significant valvular disease.  LHC on 05/05/2020 showed 99% proximal RCA stenosis, 50% proximal to mid RCA, 20% distal RCA, 40% proximal to mid LCx, 80% proximal to mid LAD.  RCA disease was treated with DES.  Plan was to return for staged LAD PCI. This was discussed at this office visit and set up for outpatient cardiac cath.   Hospital Course     Underwent successful PCI/DESx3 to the LM and p/mLAD with IVUS as she developed a distal edge dissection to distal end of stent as well as proximal edge into the LM. Well apposed stents noted at the end of case. Plan to continue on DAPT with ASA/Brilinta for at least one year, and then continue monotherapy with plavix after year of DAPT. No complications  noted overnight. No chest pain. She will be continued on home medications including atorvastatin 80mg  daily, losartan 25mg  daily. Smoking cessation encouraged, she reported she has not smoked since discharged back in feb. Given her HR was in the 70s, she was started on Toprol XL 12.5mg  daily prior to discharge. Seen by cardiac rehab prior to discharge.   General: Well developed, well nourished, female appearing in no acute distress. Head:  Normocephalic, atraumatic.  Neck: Supple without bruits, JVD. Lungs:  Resp regular and unlabored, CTA. Heart: RRR, S1, S2, no S3, S4, or murmur; no rub. Abdomen: Soft, non-tender, non-distended with normoactive bowel sounds. No hepatomegaly. No rebound/guarding. No obvious abdominal masses. Extremities: No clubbing, cyanosis, edema. Distal pedal pulses are 2+ bilaterally. Right radial cath site stable without bruising or hematoma Neuro: Alert and oriented X 3. Moves all extremities spontaneously. Psych: Normal affect.   Did the patient have an acute coronary syndrome (MI, NSTEMI, STEMI, etc) this admission?:  No                               Did the patient have a percutaneous coronary intervention (stent / angioplasty)?:  Yes.     Cath/PCI Registry Performance & Quality Measures: 1. Aspirin prescribed? - Yes 2. ADP Receptor Inhibitor (Plavix/Clopidogrel, Brilinta/Ticagrelor or Effient/Prasugrel) prescribed (includes medically managed patients)? - Yes 3. High Intensity Statin (Lipitor 40-80mg  or Crestor 20-40mg ) prescribed? - Yes 4. For EF <40%, was ACEI/ARB prescribed? - Not Applicable (EF >/= 40%) 5. For EF <40%, Aldosterone Antagonist (Spironolactone or Eplerenone) prescribed? - Not Applicable (EF >/= 40%) 6. Cardiac Rehab Phase II ordered? - Yes       _____________  Discharge Vitals Blood pressure 112/79, pulse 84, temperature 98.2 F (36.8 C), temperature source Oral, resp. rate 18, height 5\' 8"  (1.727 m), weight 64.9 kg, last menstrual period 04/25/2020, SpO2 100 %.  Filed Weights   05/23/20 0601 05/24/20 0523  Weight: 64.9 kg 64.9 kg    Labs & Radiologic Studies    CBC Recent Labs    05/24/20 0319  WBC 10.7*  HGB 12.4  HCT 34.6*  MCV 90.8  PLT 229   Basic Metabolic Panel Recent Labs    05/25/20 0319  NA 136  K 3.6  CL 105  CO2 22  GLUCOSE 103*  BUN 7  CREATININE 0.66  CALCIUM 9.3   Liver Function Tests No results for input(s): AST, ALT, ALKPHOS,  BILITOT, PROT, ALBUMIN in the last 72 hours. No results for input(s): LIPASE, AMYLASE in the last 72 hours. High Sensitivity Troponin:   Recent Labs  Lab 05/04/20 1354 05/04/20 1720 05/04/20 2126 05/05/20 0012  TROPONINIHS 142* 156* 156* 165*    BNP Invalid input(s): POCBNP D-Dimer No results for input(s): DDIMER in the last 72 hours. Hemoglobin A1C No results for input(s): HGBA1C in the last 72 hours. Fasting Lipid Panel No results for input(s): CHOL, HDL, LDLCALC, TRIG, CHOLHDL, LDLDIRECT in the last 72 hours. Thyroid Function Tests No results for input(s): TSH, T4TOTAL, T3FREE, THYROIDAB in the last 72 hours.  Invalid input(s): FREET3 _____________  CARDIAC CATHETERIZATION  Addendum Date: 05/23/2020    Prox Cx to Mid Cx lesion is 40% stenosed.  Dist RCA lesion is 20% stenosed.  Previously placed Prox RCA drug eluting stent is widely patent.  Previously placed Prox RCA to Mid RCA drug eluting stent is widely patent.  Prox LAD to Mid LAD lesion is 80% stenosed. A drug-eluting  stent was successfully placed using a STENT RESOLUTE ONYX 3.5X30, postdilated to 3.75 mm and optimized with intravascular ultrasound.  Post intervention, there is a 0% residual stenosis.  Mid LAD lesion is 75% stenosed in a very tortuous vessel, at the distal edge of the prior stent, likely representing edge dissection. A drug-eluting stent was successfully placed using a STENT RESOLUTE ONYX 3.5X8., Postdilated to 3.75 mm and optimized with intravascular ultrasound.  Post intervention, there is a 0% residual stenosis.  Mid LM lesion is 60% stenosed, likely guide catheter dissection. A drug-eluting stent was successfully placed using a STENT RESOLUTE ONYX 3.5X12, postdilated to greater than 4 mm in diameter and optimized with intravascular ultrasound.  Post intervention, there is a 0% residual stenosis.  LV end diastolic pressure is normal.  There is no aortic valve stenosis.  Continue dual antiplatelet  therapy for 12 months.  Given the length of stented segments in both her LAD, left main and RCA, would continue clopidogrel monotherapy after the 1 year of dual antiplatelet therapy.  She needs aggressive secondary prevention.  We talked about smoking cessation as well.   Result Date: 05/23/2020  Prox Cx to Mid Cx lesion is 40% stenosed.  Dist RCA lesion is 20% stenosed.  Previously placed Prox RCA drug eluting stent is widely patent.  Previously placed Prox RCA to Mid RCA drug eluting stent is widely patent.  Prox LAD to Mid LAD lesion is 80% stenosed. A drug-eluting stent was successfully placed using a STENT RESOLUTE ONYX 3.5X30, postdilated to 3.75 mm and optimized with intravascular ultrasound.  Post intervention, there is a 0% residual stenosis.  Mid LAD lesion is 75% stenosed in a very tortuous vessel, at the distal edge of the prior stent, likely representing edge dissection. A drug-eluting stent was successfully placed using a STENT RESOLUTE ONYX 3.5X8., Postdilated to 3.75 mm and optimized with intravascular ultrasound.  Post intervention, there is a 0% residual stenosis.  Mid LM lesion is 60% stenosed, likely guide catheter dissection. A drug-eluting stent was successfully placed using a STENT RESOLUTE ONYX 3.5X12, postdilated to greater than 4 mm in diameter and optimized with intravascular ultrasound.  Post intervention, there is a 0% residual stenosis.  LV end diastolic pressure is normal.  There is no aortic valve stenosis.  Continue dual antiplatelet therapy for 12 months.  Given the length of stented segments in both her LAD, left main and RCA, would continue clopidogrel monotherapy after the 1 year of dual antiplatelet therapy.  She needs aggressive secondary prevention.  We talked about smoking cessation as well.   CARDIAC CATHETERIZATION  Result Date: 05/05/2020  Prox RCA lesion is 99% stenosed.  Prox RCA to Mid RCA lesion is 50% stenosed.  Dist RCA lesion is 20% stenosed.   Prox Cx to Mid Cx lesion is 40% stenosed.  Prox LAD to Mid LAD lesion is 80% stenosed.  A drug-eluting stent was successfully placed using a STENT RESOLUTE ONYX [id number].  Post intervention, there is a 0% residual stenosis.  Post intervention, there is a 0% residual stenosis.  1.  Significant two-vessel coronary artery disease.  The culprit is 99% stenosis in the proximal RCA.  In addition, the mid RCA has moderate diffuse disease.  The LAD has 80% proximal/mid LAD at the bifurcation of a large diagonal branch. 2.  Left ventricular angiography was not performed.  EF was normal by echo.  Mildly elevated left ventricular end-diastolic pressure.\ 3.  Successful angioplasty and drug-eluting stent placement to the RCA.  I elected to use a long stent to cover the moderate disease in the mid vessel in addition to the critical stenosis in the proximal segment. Recommendations: Dual antiplatelet therapy for at least 12 months. Aggressive treatment of risk factors especially her hyperlipidemia.  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 Recommend staged LAD PCI in few weeks.  This was not done today given difficulty sedating the patient. Can likely discharge home tomorrow if no issues.   DG Chest Portable 1 View  Result Date: 05/04/2020 CLINICAL DATA:  Chest pains, SOB, chest tightness for a few days. EXAM: PORTABLE CHEST 1 VIEW COMPARISON:  Chest radiograph 09/17/2015 FINDINGS: The cardiomediastinal contours are within normal limits. The lungs are clear. No pneumothorax or pleural effusion. No acute finding in the visualized skeleton. IMPRESSION: No active disease. Electronically Signed   By: Emmaline Kluver M.D.   On: 05/04/2020 14:36   ECHOCARDIOGRAM COMPLETE  Result Date: 05/05/2020    ECHOCARDIOGRAM REPORT   Patient Name:   Susan Sexton Date of Exam: 05/05/2020 Medical Rec #:  [id number]      Height:       67.0 in Accession #:    [id number]     Weight:        143.8 lb Date of Birth:  1985-04-26      BSA:          1.758 m Patient Age:    34 years       BP:           121/76 mmHg Patient Gender: F              HR:           57 bpm. Exam Location:  Inpatient Procedure: 2D Echo, Cardiac Doppler and Color Doppler Indications:    Chest pain  History:        Patient has no prior history of Echocardiogram examinations.                 CAD; Risk Factors:Current Smoker and Dyslipidemia.  Sonographer:    Ross Ludwig RDCS (AE) Referring Phys: [id number] RHONDA G BARRETT IMPRESSIONS  1. Left ventricular ejection fraction, by estimation, is 60 to 65%. The left ventricle has normal function. The left ventricle has no regional wall motion abnormalities. There is mild left ventricular hypertrophy. Left ventricular diastolic parameters were normal.  2. Right ventricular systolic function is normal. The right ventricular size is normal. There is normal pulmonary artery systolic pressure. The estimated right ventricular systolic pressure is 35.8 mmHg.  3. Right atrial size was mildly dilated.  4. The mitral valve is normal in structure. No evidence of mitral valve regurgitation. No evidence of mitral stenosis.  5. The aortic valve is normal in structure. Aortic valve regurgitation is not visualized. No aortic stenosis is present.  6. The inferior vena cava is dilated in size with <50% respiratory variability, suggesting right atrial pressure of 15 mmHg. FINDINGS  Left Ventricle: Left ventricular ejection fraction, by estimation, is 60 to 65%. The left ventricle has normal function. The left ventricle has no regional wall motion abnormalities. The left ventricular internal cavity size was normal in size. There is  mild left ventricular hypertrophy. Left ventricular diastolic parameters were normal. Right Ventricle: The right ventricular size is normal. No increase in right ventricular wall thickness. Right ventricular systolic function is normal. There is normal pulmonary artery systolic  pressure. The tricuspid regurgitant velocity is 2.28  m/s, and  with an assumed right atrial pressure of 15 mmHg, the estimated right ventricular systolic pressure is 35.8 mmHg. Left Atrium: Left atrial size was normal in size. Right Atrium: Right atrial size was mildly dilated. Pericardium: There is no evidence of pericardial effusion. Mitral Valve: The mitral valve is normal in structure. No evidence of mitral valve regurgitation. No evidence of mitral valve stenosis. MV peak gradient, 4.2 mmHg. The mean mitral valve gradient is 2.0 mmHg. Tricuspid Valve: The tricuspid valve is normal in structure. Tricuspid valve regurgitation is mild . No evidence of tricuspid stenosis. Aortic Valve: The aortic valve is normal in structure. Aortic valve regurgitation is not visualized. No aortic stenosis is present. Aortic valve mean gradient measures 4.0 mmHg. Aortic valve peak gradient measures 7.7 mmHg. Aortic valve area, by VTI measures 2.13 cm. Pulmonic Valve: The pulmonic valve was normal in structure. Pulmonic valve regurgitation is trivial. No evidence of pulmonic stenosis. Aorta: The aortic root is normal in size and structure. Venous: The inferior vena cava is dilated in size with less than 50% respiratory variability, suggesting right atrial pressure of 15 mmHg. IAS/Shunts: No atrial level shunt detected by color flow Doppler.  LEFT VENTRICLE PLAX 2D LVIDd:         3.60 cm  Diastology LVIDs:         2.40 cm  LV e' medial:    10.70 cm/s LV PW:         1.00 cm  LV E/e' medial:  7.9 LV IVS:        1.20 cm  LV e' lateral:   11.60 cm/s LVOT diam:     1.90 cm  LV E/e' lateral: 7.3 LV SV:         60 LV SV Index:   34 LVOT Area:     2.84 cm  RIGHT VENTRICLE             IVC RV Basal diam:  3.00 cm     IVC diam: 2.20 cm RV S prime:     14.10 cm/s TAPSE (M-mode): 2.6 cm LEFT ATRIUM             Index       RIGHT ATRIUM           Index LA diam:        2.60 cm 1.48 cm/m  RA Area:     15.80 cm LA Vol (A2C):   40.6 ml 23.10 ml/m  RA Volume:   45.40 ml  25.83 ml/m LA Vol (A4C):   42.2 ml 24.01 ml/m LA Biplane Vol: 42.7 ml 24.29 ml/m  AORTIC VALVE AV Area (Vmax):    1.89 cm AV Area (Vmean):   2.00 cm AV Area (VTI):     2.13 cm AV Vmax:           139.00 cm/s AV Vmean:          92.200 cm/s AV VTI:            0.284 m AV Peak Grad:      7.7 mmHg AV Mean Grad:      4.0 mmHg LVOT Vmax:         92.70 cm/s LVOT Vmean:        64.900 cm/s LVOT VTI:          0.213 m LVOT/AV VTI ratio: 0.75  AORTA Ao Root diam: 2.60 cm Ao Asc diam:  2.70 cm MITRAL VALVE  TRICUSPID VALVE MV Area (PHT): 3.27 cm    TR Peak grad:   20.8 mmHg MV Area VTI:   2.19 cm    TR Vmax:        228.00 cm/s MV Peak grad:  4.2 mmHg MV Mean grad:  2.0 mmHg    SHUNTS MV Vmax:       1.02 m/s    Systemic VTI:  0.21 m MV Vmean:      62.4 cm/s   Systemic Diam: 1.90 cm MV Decel Time: 232 msec MV E velocity: 84.80 cm/s MV A velocity: 51.80 cm/s MV E/A ratio:  1.64 Donato Schultz MD Electronically signed by Donato Schultz MD Signature Date/Time: 05/05/2020/11:23:42 AM    Final    Disposition   Pt is being discharged home today in good condition.  Follow-up Plans & Appointments     Follow-up Information    Little Ishikawa, MD Follow up on 06/13/2020.   Specialties: Cardiology, Radiology Why: at 9:40am for your follow up appt Contact information: [address] [place] [address] [phone]              Discharge Instructions    Amb Referral to Cardiac Rehabilitation   Complete by: As directed    Diagnosis: Coronary Stents   After initial evaluation and assessments completed: Virtual Based Care may be provided alone or in conjunction with Phase 2 Cardiac Rehab based on patient barriers.: Yes   Call MD for:  redness, tenderness, or signs of infection (pain, swelling, redness, odor or green/yellow discharge around incision site)   Complete by: As directed    Diet - low sodium heart healthy   Complete by: As directed    Discharge  instructions   Complete by: As directed    Radial Site Care Refer to this sheet in the next few weeks. These instructions provide you with information on caring for yourself after your procedure. Your caregiver may also give you more specific instructions. Your treatment has been planned according to current medical practices, but problems sometimes occur. Call your caregiver if you have any problems or questions after your procedure. HOME CARE INSTRUCTIONS You may shower the day after the procedure.Remove the bandage (dressing) and gently wash the site with plain soap and water.Gently pat the site dry.  Do not apply powder or lotion to the site.  Do not submerge the affected site in water for 3 to 5 days.  Inspect the site at least twice daily.  Do not flex or bend the affected arm for 24 hours.  No lifting over 5 pounds (2.3 kg) for 5 days after your procedure.  Do not drive home if you are discharged the same day of the procedure. Have someone else drive you.  You may drive 24 hours after the procedure unless otherwise instructed by your caregiver.  What to expect: Any bruising will usually fade within 1 to 2 weeks.  Blood that collects in the tissue (hematoma) may be painful to the touch. It should usually decrease in size and tenderness within 1 to 2 weeks.  SEEK IMMEDIATE MEDICAL CARE IF: You have unusual pain at the radial site.  You have redness, warmth, swelling, or pain at the radial site.  You have drainage (other than a small amount of blood on the dressing).  You have chills.  You have a fever or persistent symptoms for more than 72 hours.  You have a fever and your symptoms suddenly get worse.  Your arm  becomes pale, cool, tingly, or numb.  You have heavy bleeding from the site. Hold pressure on the site.   Increase activity slowly   Complete by: As directed       Discharge Medications   Allergies as of 05/24/2020   No Known Allergies     Medication List    TAKE  these medications   albuterol 108 (90 Base) MCG/ACT inhaler Commonly known as: VENTOLIN HFA Inhale 2 puffs into the lungs every 6 (six) hours as needed for shortness of breath.   aspirin 81 MG chewable tablet Chew 1 tablet (81 mg total) by mouth daily.   atorvastatin 80 MG tablet Commonly known as: LIPITOR Take 1 tablet (80 mg total) by mouth daily.   losartan 25 MG tablet Commonly known as: COZAAR Take 0.5 tablets (12.5 mg total) by mouth daily.   metoprolol succinate 25 MG 24 hr tablet Commonly known as: TOPROL-XL Take 0.5 tablets (12.5 mg total) by mouth daily.   MULTI ADULT GUMMIES PO Take 1 tablet by mouth daily.   nicotine polacrilex 4 MG gum Commonly known as: NICORETTE Take 1 each (4 mg total) by mouth as needed for smoking cessation. Take  every 1-2 hours as needed for 6 weeks, then every 2-4 hours as needed for 3 weeks, then every 4-8 hours for 3 weeks. Avoid food/drink 15 mins before and after use.   nitroGLYCERIN 0.4 MG SL tablet Commonly known as: NITROSTAT Place 1 tablet (0.4 mg total) under the tongue every 5 (five) minutes x 3 doses as needed for chest pain.   ticagrelor 90 MG Tabs tablet Commonly known as: BRILINTA Take 1 tablet (90 mg total) by mouth 2 (two) times daily.         Outstanding Labs/Studies   N/a   Duration of Discharge Encounter   Greater than 30 minutes including physician time.  Signed, Laverda Page, NP 05/24/2020, 12:10 PM   ATTENDING ATTESTATION  I have seen, examined and evaluated the patient this Am along with Laverda Page, NP-C.  After reviewing all the available data and chart, we discussed the patients laboratory, study & physical findings as well as symptoms in detail. I agree with her findings, examination as well as impression recommendations as per our discussion.    She did well overnight with no major issues.  Cardiac cath images reviewed.  Initial LAD lesion covered with a stent but there was distal edge  dissection, but the distal overlapping stent, then left main catheter dissection cover with a stent.  Brisk flow with widely patent stents and patent circumflex as well.  No recurrent angina.  I stressed the absolute importance of maintaining consistent dosing of Brilinta.  My recommendation would be minimal 1 year uninterrupted Brilinta and then another year of either maintenance dose Brilinta 60 mg twice daily or Plavix 75 mg daily.  She is down to determine to stay away from smoking.  She is on high-dose statin. Blood pressures been well controlled on current meds.  No changes to Toprol and ARB dose.  Doing well overnight status post complex PCI.  Okay for discharge.  Agree with summary.   Bryan Lemma, M.D., M.S. Interventional Cardiologist   Pager # [phone] Phone # [phone] [address] [place], [place] [address]

## 2020-05-24 NOTE — Progress Notes (Signed)
CARDIAC REHAB PHASE I   PRE:  Rate/Rhythm: 70 SR  BP:  Supine: 121/73  Sitting:   Standing:    SaO2: 100%RA  MODE:  Ambulation: 470 ft   POST:  Rate/Rhythm: 89 SR  BP:  Supine:   Sitting: 123/83  Standing:    SaO2: 99%RA [id number] Pt walked 470 ft on RA with steady gait and no CP. Tolerated well. Brief review of ed done on 2/26 by Cardiac Rehab. Reviewed brilinta, NTG use, walking for ex, and heart healthy food choices. Pt still interested in Virtual program. Update sent to [facility] [place]. Pt stated she has quit smoking and has not smoked since discharge. Congratulated pt on this! Pt would like to see case manager.    Luetta Nutting, RN BSN  05/24/2020 9:39 AM

## 2020-06-05 ENCOUNTER — Telehealth: Payer: Self-pay | Admitting: Cardiology

## 2020-06-05 MED ORDER — LOSARTAN POTASSIUM 25 MG PO TABS: 12.5000 mg | ORAL_TABLET | Freq: Every day | ORAL | 3 refills | Status: DC

## 2020-06-05 MED ORDER — ATORVASTATIN CALCIUM 80 MG PO TABS: 80.0000 mg | ORAL_TABLET | Freq: Every day | ORAL | 3 refills | Status: DC

## 2020-06-05 MED ORDER — TICAGRELOR 90 MG PO TABS: 90.0000 mg | ORAL_TABLET | Freq: Two times a day (BID) | ORAL | 0 refills | Status: DC

## 2020-06-05 NOTE — Telephone Encounter (Signed)
Thanks for letting me know, I will give her a call tomorrow first thing once I've looked through her previous visits.  In the future if she needs meds filled it may be cheapest to have them sent to [facility], if she is agreeable, since they have multiple assistance programs she may be eligible for! I'll let y'all know if there is anything else we can assist with.

## 2020-06-05 NOTE — Telephone Encounter (Signed)
Patient returning call.

## 2020-06-05 NOTE — Telephone Encounter (Signed)
Called - had to leave message to call back   no samples of Atorvastatin 80 mg  And Losartan 25 mg both are generic  samples available  For BRILINTA 90 mg -16 days worth  (placed at front desk)    1 )Need to know if patient has any finance to pay for any medication  ? 2 ) did she get a 30 day free card for BRILINTA 3 ) Has she filled out patient assistance for BRILINTA 4 ) will the patient be ok to discuss options with Child psychotherapist for assistance    PLACED ASSISTANCE FORM IN SAMple BAG

## 2020-06-05 NOTE — Telephone Encounter (Signed)
    Patient calling the office for samples of medication:   1.  What medication and dosage are you requesting samples for?  ticagrelor (BRILINTA) 90 MG TABS tablet    atorvastatin (LIPITOR) 80 MG tablet    losartan (COZAAR) 25 MG tablet   2.  Are you currently out of this medication? Yes for Brilinta  Pt said she is trying to apply for pt assistance but she only have 1 pill left for  her brilinta. She also ask for atorvastatin and losartan

## 2020-06-05 NOTE — Telephone Encounter (Signed)
Spoke to patient . She states she has enough funds to pick Losartan 12.5 mg ( 1/2 of 25 mg) 90 day supply. Atorvastatin 80 mg  ( 90 day supply) Cost $16 and$13  Placed saving card.  patient aware Samples for Brilinta available and to fill out the patient assistance and turn it .   I also ask patient if it would mind if [facility] social worker could call for assistance . Patient states that would be nice. ( patient states she has an appointment with disability in April 2022)

## 2020-06-06 ENCOUNTER — Telehealth: Payer: Self-pay | Admitting: Licensed Clinical Social Worker

## 2020-06-06 NOTE — Telephone Encounter (Signed)
LCSW received referral for financial/insurance needs.  Attempted to reach pt via telephone at [phone]- no answer and unable to leave voicemail as mailbox full. Will reattempt to reach pt again tomorrow if no call back today.   Octavio Graves, MSW, LCSW [facility] Heart/Vascular Care Navigation  [phone]

## 2020-06-06 NOTE — Progress Notes (Signed)
Heart and Vascular Care Navigation  06/06/2020  Susan Sexton Jan 19, 1986 [id number]  Reason for Referral:  Engaged with patient by telephone for initial visit for Heart and Vascular Care Coordination.                                                                                                   Assessment:     LCSW was able to connect w/ pt this afternoon at [phone]. Introduced self, role, reason for call. Pt confirms listed address but shares that is her mailing address (her mother's apartment) and she has been homeless for about a year and staying mostly in her car. She is okay with her sister remaining her emergency contact but shares she cannot stay with her since she has her own kids and is tight on room.   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 Bjorn Pippin about going back if able after her appointment on 4/5. She has an upcoming appointment w/ [facility]. Will be able to see a Artist after that appointment.   She has been receiving SNAP but had a temporary lapse due to need for reauthorization, she is waiting for that support to be renewed. She shares that her friends allow her to shower and stay as needed. Pt friends and a friend's mother provide her with support as she shares this period of homelessness and unemployment has been taxing for her. I shared that pt may be eligible for several medical assistance programs including CAFA, [employer] and [employer]. I offered to send these applications to her mothers apartment and that she could begin to complete them and bring them to her upcoming appointment to complete them. Pt in agreement.   LCSW offered to complete VISPDAT and send to [facility]. I explained what both items were and pt in agreement. We completed assessment while pt on phone- I confirmed that I was on  speakerphone but pt was alone and was okay with completing information at that time. I encouraged pt to call me with any questions/concerns or if she has difficulty getting to her upcoming appointment.                                     HRT/VAS Care Coordination    Patients Home Cardiology Office [facility]   Outpatient Care Team Social Worker   Social Worker Name: Esmeralda Links [place], [phone]   Living arrangements for the past 2 months No permanent address   Lives with: Self   Patient Has Concern With Paying Medical Bills Yes   Patient Concerns With Medical Bills past medical bills and ongoing medical care needs   Medical Bill Referrals: CAFA/[employer] [facility]   Does Patient Have Prescription Coverage? No   Patient Prescription Assistance Programs [employer] [facility]; Patient Assistance Programs   [place] [facility] Medications mailed application 3/29   Patient  Assistance Programs Medications pt will need Brilinta PAP   Home Assistive Devices/Equipment None      Social History:                                                                             SDOH Screenings   Alcohol Screen: Not on file  Depression (MWU1-3): Not on file  Financial Resource Strain: High Risk  . Difficulty of Paying Living Expenses: Very hard  Food Insecurity: Food Insecurity Present  . Worried About Programme researcher, broadcasting/film/video in the Last Year: Sometimes true  . Ran Out of Food in the Last Year: Sometimes true  Housing: High Risk  . Last Housing Risk Score: 2  Physical Activity: Not on file  Social Connections: Not on file  Stress: Stress Concern Present  . Feeling of Stress : To some extent  Tobacco Use: High Risk  . Smoking Tobacco Use: Current Every Day Smoker  . Smokeless Tobacco Use: Never Used  Transportation Needs: No Transportation Needs  . Lack of Transportation (Medical): No  . Lack of Transportation (Non-Medical): No    SDOH Interventions: Financial Resources:  Financial  Strain Interventions: Chiropodist (Comment) (CAFA/[facility]/[place] [facility] applications given, SNAP reauth pending, VISPDAT sent to [employer] [facility])  Food Insecurity:  Food Insecurity Interventions: Other (Comment) (pt pending renewal of SNAP (has completed reauth); sent food resources)  Housing Insecurity:  Housing Interventions: Other (Comment) (VISPDAT completed and sent to [facility])  Transportation:   Transportation Interventions: Intervention Not Indicated    Other Care Navigation Interventions:     Provided Pharmacy assistance resources [employer] Assistance Programs  Patient expressed Mental Health concerns Yes, Referred to:  patient declines assistance at this time shares she has support from family and friends   Follow-up plan:   LCSW has mailed information about CAFA, [employer], [facility] and [place] [facility] along w/ instructions. I will also include my cards for any additional questions/concerns.

## 2020-06-08 ENCOUNTER — Telehealth: Payer: Self-pay | Admitting: Cardiology

## 2020-06-08 NOTE — Telephone Encounter (Signed)
Patient is requesting 2 letters: She would like a return to work letter for her employer and a letter regarding her heart condition for social security. Please assist.

## 2020-06-08 NOTE — Telephone Encounter (Signed)
Spoke to pt regarding return to work letter as well as a Physicist, medical for Tree surgeon. Pt would like to try to get medicaid to help with medication costs. Explained that return to work letter would be relatively easy to make, however letter for social security would probably need more details. Will check with Dr. Bjorn Pippin about return to work letter.

## 2020-06-09 NOTE — Telephone Encounter (Signed)
Yes OK to return to work

## 2020-06-12 ENCOUNTER — Telehealth: Payer: Self-pay | Admitting: Licensed Clinical Social Worker

## 2020-06-12 NOTE — Telephone Encounter (Signed)
Return to work letter mailed to pt today.

## 2020-06-12 NOTE — Progress Notes (Signed)
Heart and Vascular Care Navigation  06/12/2020  Susan Sexton 1985/04/03 [id number]  Reason for Referral:   Engaged with patient by telephone for follow up visit for Heart and Vascular Care Coordination.                                                                                                   Assessment:            LCSW called and spoke with pt, she shared her sister is present during the conversation. Pt shares she has not yet received the information from this writer which was mailed but she will check. She has rescheduled her appointment for tomorrow until May, she has been in touch with this office regarding her clearance to return to work and it appears additional letters for return have been mailed to her. Pt encouraged to call this writer if any additional questions/concerns once she has received the applications.                             HRT/VAS Care Coordination    Patients Home Cardiology Office [facility]   Outpatient Care Team Social Worker   Social Worker Name: Esmeralda Links [place], [phone]   Living arrangements for the past 2 months No permanent address   Lives with: Self   Patient Has Concern With Paying Medical Bills Yes   Patient Concerns With Medical Bills past medical bills and ongoing medical care needs   Medical Bill Referrals: CAFA/[facility]   Does Patient Have Prescription Coverage? No   Patient Prescription Assistance Programs [place] [facility]; Patient Assistance Programs   [place] [facility] Medications mailed application 3/29   Patient Assistance Programs Medications pt will need Brilinta PAP   Home Assistive Devices/Equipment None      Social History:                                                                             SDOH Screenings   Alcohol Screen: Not on file  Depression (HCW2-3): Not on file  Financial Resource Strain: High Risk  . Difficulty of Paying Living Expenses: Very hard  Food Insecurity: Food  Insecurity Present  . Worried About Programme researcher, broadcasting/film/video in the Last Year: Sometimes true  . Ran Out of Food in the Last Year: Sometimes true  Housing: High Risk  . Last Housing Risk Score: 2  Physical Activity: Not on file  Social Connections: Not on file  Stress: Stress Concern Present  . Feeling of Stress : To some extent  Tobacco Use: High Risk  . Smoking Tobacco Use: Current Every Day Smoker  . Smokeless Tobacco Use: Never Used  Transportation Needs: No Transportation Needs  . Lack of Transportation (Medical): No  .  Lack of Transportation (Non-Medical): No    SDOH Interventions: Financial Resources:  Financial Strain Interventions: Other (Comment) (Patient Assistance Applications)    Follow-up plan:   I will follow up in 1 week if I have not heard back from pt to ensure she has received assistance applications which include CAFA, [employer] and Medication Assistance.

## 2020-06-13 ENCOUNTER — Ambulatory Visit: Payer: Self-pay | Admitting: Cardiology

## 2020-06-13 NOTE — Telephone Encounter (Signed)
Not sure what that would be for- except I believe she has applied for disability? You may have to call and ask specifically what it needs to say. Sorry I can't be of more help w/ that! She is not eligible for Medicaid per her last visit to DSS. I have mailed her several assistance applications for ongoing care support and medications. When I called yesterday to check she had not yet received those yet. I will f/u with her next week and she was encouraged to bring the apps and documents to her rescheduled appt on 5/5.

## 2020-06-14 ENCOUNTER — Other Ambulatory Visit: Payer: Self-pay

## 2020-06-14 ENCOUNTER — Telehealth: Payer: Self-pay | Admitting: Licensed Clinical Social Worker

## 2020-06-14 NOTE — Telephone Encounter (Signed)
Pt called this writer to inquire if she could send information to me for the assistance applications she had been sent. I shared that she could send it to me and then we could print and save it in a folder for her or she could gather all documents/complete the applications and bring it to her appointment on the 5th of May or bring information to the office prior to then by arranging a time with me to do so.   Octavio Graves, MSW, LCSW [facility] Health Heart/Vascular Care Navigation  [phone]

## 2020-06-14 NOTE — Telephone Encounter (Signed)
Called patient to discuss letter needed for DSS.  She is unsure what is needed in this letter but she states she has appt on 4/19 with DSS.    She does not have a case worker at this time.   Patient states she will call and ask what is need in the letter and return call.

## 2020-06-15 NOTE — Telephone Encounter (Signed)
Called spoke to patient. She is aware the medication can be purchased over the counter. Patient states she will ask pharmacist were it is on the shelf.  Patient is also would like a copy of letter to return to work. Sent letter via [employer]

## 2020-06-20 ENCOUNTER — Telehealth: Payer: Self-pay | Admitting: Cardiology

## 2020-06-20 NOTE — Telephone Encounter (Signed)
Spoke with pt regarding 30 day free card for Brilinta. Explained to pt that she should be able to take the card [facility] to [facility] and they should be able to honor it. Advised pt to call us back at the office if there was any issues with using the card. Pt verbalizes understanding.

## 2020-06-20 NOTE — Telephone Encounter (Signed)
Pt c/o medication issue:  1. Name of Medication: ticagrelor (BRILINTA) 90 MG TABS tablet  2. How are you currently taking this medication (dosage and times per day)? 1 tablet twice a day  3. Are you having a reaction (difficulty breathing--STAT)? no  4. What is your medication issue? Patient states she was given samples and a Good Rx card for 1 month free. She is not sure how to use the card and whether she needs a copy of her prescription.

## 2020-06-22 ENCOUNTER — Telehealth: Payer: Self-pay | Admitting: Cardiology

## 2020-06-22 MED ORDER — TICAGRELOR 90 MG PO TABS: 90.0000 mg | ORAL_TABLET | Freq: Two times a day (BID) | ORAL | 3 refills | Status: DC

## 2020-06-22 MED ORDER — TICAGRELOR 90 MG PO TABS
90.0000 mg | ORAL_TABLET | Freq: Two times a day (BID) | ORAL | 0 refills | Status: DC
Start: 2020-06-22 — End: 2020-07-24

## 2020-06-22 NOTE — Addendum Note (Signed)
Addended by: Johney Frame A on: 06/22/2020 04:28 PM   Modules accepted: Orders

## 2020-06-22 NOTE — Telephone Encounter (Signed)
*  STAT* If patient is at the pharmacy, call can be transferred to refill team.   1. Which medications need to be refilled? (please list name of each medication and dose if known)  ticagrelor (BRILINTA) 90 MG TABS tablet  2. Which pharmacy/location (including street and city if local pharmacy) is medication to be sent to? [id number] - [place], [place] - [address]  3. Do they need a 30 day or 90 day supply? 30 with refills   Patient is out of medication

## 2020-06-22 NOTE — Telephone Encounter (Signed)
Spoke to patient, she states she is still unaware of what is needed in the letter.  She will try to contact them again tomorrow.

## 2020-06-22 NOTE — Telephone Encounter (Signed)
Patient called and states that there was no rx on file at her pharmacy for her to use her discount card with. A refill request has been submitted in another encounter

## 2020-06-22 NOTE — Telephone Encounter (Signed)
30 day supply sent to [facility] aware.

## 2020-06-29 ENCOUNTER — Telehealth: Payer: Self-pay | Admitting: Cardiology

## 2020-06-29 NOTE — Telephone Encounter (Signed)
Returned call to patient-patient reports she returned to work 4/4 as requested and she feels that she may need restrictions for work.  She works for [employer], Building control surveyor of large trucks.   A lot of physical, strenuous work.   She has been feeling fine, yesterday lifted a part ~ 80 lbs and started having chest pressure and lightheadedness.   She went home early and it resolved, no symptoms today.   She works M-Sat 11 hour days.    She reports compliance with her medications, no missed doses.   She has upcoming appt 5/5 with Dr. Bjorn Pippin but would like a work note with lifting restrictions until she regains her strength.      Routed to MD to review.

## 2020-06-29 NOTE — Telephone Encounter (Signed)
Pt states she was given a  release to work paperwork on 06/12/2020 but after being at work she feels like she can not do the job. Pt states she will need paperwork stating she can do light duty or be out of work because the job is too stressful. Please Advise

## 2020-06-30 NOTE — Telephone Encounter (Signed)
Yes can we write note for lifting restrictions, would ask her what would be needed- 20 lbs?

## 2020-07-03 ENCOUNTER — Inpatient Hospital Stay: Payer: Self-pay | Admitting: Nurse Practitioner

## 2020-07-03 NOTE — Telephone Encounter (Signed)
Spoke to patient, she request no lifting greater than 40 lbs.  Letter created and sent via Mychart.   Patient reports she just had a near-syncopal episode.  She reports she was sitting outside getting ready to eat, started to get nauseous, hot and lightheaded, felt like she was going to pass out.   She is unsure if she had CP or SOB with this episode.   Reports her friend helped her get in the car in the [facility] and once she cooled off symptoms resolved.   She reports not drinking much water today.   She believes she got too hot.    Unable to check BP/HR at this time but will check when she gets home.     Advised to hydrate and if she has another near syncopal episode or symptoms worsen to proceed to ER for evaluation.  Patient verbalized understanding.    Routed to MD to review.

## 2020-07-05 NOTE — Telephone Encounter (Signed)
Agree with plan 

## 2020-07-07 ENCOUNTER — Telehealth: Payer: Self-pay | Admitting: Licensed Clinical Social Worker

## 2020-07-07 NOTE — Telephone Encounter (Signed)
Confirmed pt able to make her appt on 5/2 (rescheduled), encouraged her to bring ID card, her medications, and assistance paperwork she had previously been mailed. No additional questions/concerns at this time.   Octavio Graves, MSW, LCSW [facility] Health Heart/Vascular Care Navigation  [phone]

## 2020-07-09 NOTE — Progress Notes (Deleted)
Cardiology Office Note:    Date:  07/09/2020   ID:  Susan Sexton, DOB 09/13/1985, MRN [id number]  PCP:  System, Provider Not In  Cardiologist:  Little Ishikawa, MD  Electrophysiologist:  None   Referring MD: No ref. provider found   No chief complaint on file.   History of Present Illness:    Susan Sexton is a 35 y.o. female with a hx of CAD, hyperlipidemia, tobacco use who presents for follow-up.  She was admitted to [facility] on 05/04/2020 with NSTEMI.  She had presented to med [facility] ED with chest pain.  Found to have mildly elevated troponins (peak 156).  Despite her age, she was noted to have significant CAD risk factors as suspected FH given LDL to 13 and family history including father having CABG in his 67s.  Echocardiogram was done on 05/05/2020 which showed normal biventricular function, no significant valvular disease.  LHC on 05/05/2020 showed 99% proximal RCA stenosis, 50% proximal to mid RCA, 20% distal RCA, 40% proximal to mid LCx, 80% proximal to mid LAD.  RCA disease was treated with DES.  Staged PCI to the LAD was done on 05/23/2020.  Underwent DES x2 to proximal/mid LAD.  This was complicated by likely guide catheter dissection of the left main, which was treated with DES.  Since her discharge,  he reports that she has been doing okay.  Denies any further chest pain.  No dyspnea, lightheadedness, syncope, palpitations, or lower extremity edema.  Reports she noted a small amount of blood in her urine one day but has resolved.  She denies any blood in her stool.   Past Medical History:  Diagnosis Date  . Asthma   . Coronary artery disease   . High cholesterol   . Hyperlipidemia LDL goal <100 05/04/2020  . NSTEMI (non-ST elevated myocardial infarction) [facility])     Past Surgical History:  Procedure Laterality Date  . BREAST LUMPECTOMY Left 06/6/219  . CORONARY STENT INTERVENTION N/A 05/05/2020   Procedure: CORONARY STENT INTERVENTION;  Surgeon: Iran Ouch, MD;  [facility]: [facility] INVASIVE CV LAB;  Service: Cardiovascular;  Laterality: N/A;  . CORONARY STENT INTERVENTION N/A 05/23/2020   Procedure: CORONARY STENT INTERVENTION;  Surgeon: Corky Crafts, MD;  Location: [facility] INVASIVE CV LAB;  Service: Cardiovascular;  Laterality: N/A;  . CORONARY STENT PLACEMENT  05/05/2020  . INCISION AND DRAINAGE BREAST ABSCESS Left 09/2017  . INTRAVASCULAR ULTRASOUND/IVUS N/A 05/23/2020   Procedure: Intravascular Ultrasound/IVUS;  Surgeon: Corky Crafts, MD;  Location: [facility] INVASIVE CV LAB;  Service: Cardiovascular;  Laterality: N/A;  . LEFT HEART CATH AND CORONARY ANGIOGRAPHY N/A 05/05/2020   Procedure: LEFT HEART CATH AND CORONARY ANGIOGRAPHY;  Surgeon: Iran Ouch, MD;  Location: [facility] INVASIVE CV LAB;  Service: Cardiovascular;  Laterality: N/A;  . LEFT HEART CATH AND CORONARY ANGIOGRAPHY N/A 05/23/2020   Procedure: LEFT HEART CATH AND CORONARY ANGIOGRAPHY;  Surgeon: Corky Crafts, MD;  Location: [facility] INVASIVE CV LAB;  Service: Cardiovascular;  Laterality: N/A;    Current Medications: No outpatient medications have been marked as taking for the 07/10/20 encounter (Appointment) with Little Ishikawa, MD.     Allergies:   Patient has no known allergies.   Social History   Socioeconomic History  . Marital status: Single    Spouse name: Not on file  . Number of children: Not on file  . Years of education: Not on file  . Highest education level: Not on file  Occupational History  . Not on file  Tobacco Use  . Smoking status: Current Every Day Smoker    Packs/day: 0.50    Types: Cigarettes  . Smokeless tobacco: Never Used  Vaping Use  . Vaping Use: Some days  Substance and Sexual Activity  . Alcohol use: No  . Drug use: No  . Sexual activity: Not on file  Other Topics Concern  . Not on file  Social History Narrative  . Not on file   Social Determinants of Health   Financial Resource Strain: High Risk  . Difficulty of Paying  Living Expenses: Very hard  Food Insecurity: Food Insecurity Present  . Worried About Programme researcher, broadcasting/film/video in the Last Year: Sometimes true  . Ran Out of Food in the Last Year: Sometimes true  Transportation Needs: No Transportation Needs  . Lack of Transportation (Medical): No  . Lack of Transportation (Non-Medical): No  Physical Activity: Not on file  Stress: Stress Concern Present  . Feeling of Stress : To some extent  Social Connections: Not on file     Family History: The patient's family history includes Heart attack (age of onset: 36) in her father; Hypertension in her mother.  ROS:   Please see the history of present illness.     All other systems reviewed and are negative.  EKGs/Labs/Other Studies Reviewed:    The following studies were reviewed today:   EKG:  EKG is  ordered today.  The ekg ordered today demonstrates normal sinus rhythm, rate 91, no ST/T abnormalities  Recent Labs: 05/04/2020: TSH 4.381 05/05/2020: ALT 19 05/24/2020: BUN 7; Creatinine, Ser 0.66; Hemoglobin 12.4; Platelets 229; Potassium 3.6; Sodium 136  Recent Lipid Panel    Component Value Date/Time   CHOL 230 (H) 05/05/2020 0012   TRIG 52 05/05/2020 0012   HDL 49 05/05/2020 0012   CHOLHDL 4.7 05/05/2020 0012   VLDL 10 05/05/2020 0012   LDLCALC 171 (H) 05/05/2020 0012    Physical Exam:    VS:  There were no vitals taken for this visit.    Wt Readings from Last 3 Encounters:  05/24/20 143 lb 1.3 oz (64.9 kg)  05/17/20 143 lb 3.2 oz (65 kg)  05/06/20 142 lb 10.2 oz (64.7 kg)     GEN:  Well nourished, well developed in no acute distress HEENT: Normal NECK: No JVD; No carotid bruits LYMPHATICS: No lymphadenopathy CARDIAC: RRR, no murmurs, rubs, gallops RESPIRATORY:  Clear to auscultation without rales, wheezing or rhonchi  ABDOMEN: Soft, non-tender, non-distended MUSCULOSKELETAL:  No edema; No deformity  SKIN: Warm and dry NEUROLOGIC:  Alert and oriented x 3 PSYCHIATRIC:  Normal affect    ASSESSMENT:    No diagnosis found. PLAN:    CAD: Presented with NSTEMI 05/04/2020.  Echocardiogram was done on 05/05/2020 which showed normal biventricular function, no significant valvular disease.  LHC on 05/05/2020 showed 99% proximal RCA stenosis, 50% proximal to mid RCA, 20% distal RCA, 40% proximal to mid LCx, 80% proximal to mid LAD.  RCA disease was treated with DES.  Staged PCI to the LAD was done on 05/23/2020.  Underwent DES x2 to proximal/mid LAD.  This was complicated by likely guide catheter dissection of the left main, which was treated with DES. -Continue aspirin 81 mg daily, ticagrelor 90 mg twice daily -Continue atorvastatin 80 mg daily -Continue Toprol-XL 12.5 mg daily  Hyperlipidemia: Suspect FH given LDL 282 in 01/2020 and family history including father had CABG in 30s.  Was started  on atorvastatin 20 mg daily with improvement in LDL to 171 on 2/25.  Atorvastatin dose increased to 80 mg daily at that time.  Recheck lipid panel in 2 months, if not at goal LDL less than 70 will refer to lipid clinic for PCSK9 inhibitor  Hypertension: On losartan 12.5 mg daily, Toprol-XL 12.5 mg daily  Tobacco use: Smoked 1 pack/day x 15 years.  Quit smoking since her stent, encouraged continued cessation  RTC in ***  Medication Adjustments/Labs and Tests Ordered: Current medicines are reviewed at length with the patient today.  Concerns regarding medicines are outlined above.  No orders of the defined types were placed in this encounter.  No orders of the defined types were placed in this encounter.   There are no Patient Instructions on file for this visit.   Signed, Little Ishikawa, MD  07/09/2020 1:55 PM    [facility]

## 2020-07-10 ENCOUNTER — Encounter: Payer: Self-pay | Admitting: Cardiology

## 2020-07-10 ENCOUNTER — Ambulatory Visit (INDEPENDENT_AMBULATORY_CARE_PROVIDER_SITE_OTHER): Payer: Self-pay | Admitting: Cardiology

## 2020-07-10 ENCOUNTER — Other Ambulatory Visit: Payer: Self-pay

## 2020-07-10 VITALS — BP 90/72 | HR 66 | Ht 67.0 in | Wt 151.2 lb

## 2020-07-10 DIAGNOSIS — R309 Painful micturition, unspecified: Secondary | ICD-10-CM

## 2020-07-10 DIAGNOSIS — R8290 Unspecified abnormal findings in urine: Secondary | ICD-10-CM

## 2020-07-10 DIAGNOSIS — R42 Dizziness and giddiness: Secondary | ICD-10-CM

## 2020-07-10 DIAGNOSIS — E785 Hyperlipidemia, unspecified: Secondary | ICD-10-CM

## 2020-07-10 DIAGNOSIS — Z720 Tobacco use: Secondary | ICD-10-CM

## 2020-07-10 DIAGNOSIS — I2510 Atherosclerotic heart disease of native coronary artery without angina pectoris: Secondary | ICD-10-CM

## 2020-07-10 MED ORDER — FLUCONAZOLE 150 MG PO TABS: 150.0000 mg | ORAL_TABLET | Freq: Once | ORAL | 0 refills | Status: AC

## 2020-07-10 NOTE — Progress Notes (Signed)
Heart and Vascular Care Navigation  07/10/2020  Susan Sexton [birth year] [id number]  Reason for Referral: Engaged with patient face to face for follow up visit for Heart and Vascular Care Coordination.                                                                                                   Assessment:                       LCSW met with pt at conclusion of appt today at [facility]. Introduced self, role, reason for visit as this is pt first time meeting me in person. LCSW inquired if pt has brought paperwork to appt that was sent to her. Pt shares is it partially completed in her vehicle. She also shares that she has applied for disability and Medicaid. She has a caseworker Jarold Motto at [employer]. LCSW shared that if Medicaid application is pending that we would not be able to submit for some of the assistance applications she has been sent including [place]. LCSW explained that I would reach out to financial counseling team through [facility] to see if they are able to access if she has a pending Medicaid application.   Pt shares she has been able to obtain her medications. I shared again that pt can utilize [facility] pharmacies if she needs an affordable option. She has had her SNAP re-instated. She is still transiently staying in her car. Pt had gone back to a job but it was too much strain on her at this time per her report; and so she is not working at this time. I encouraged her to reach out to me if she has any additional questions/concerns/needs moving forward. We reviewed the upcoming hospital f/u appointment at [facility].                  HRT/VAS Care Coordination    Patients Home Cardiology Office [place] Team Social Worker   Social Worker Name: Margarito Liner Merkel, [phone]   Living arrangements for the past 2 months No permanent address   Lives with: Self   Patient Has Concern  With Paying Medical Bills Yes   Patient Concerns With Medical Bills past medical bills and ongoing medical care needs   Medical Bill Referrals: [facility]/[place] [facility]   Does Patient Have Prescription Coverage? No   Patient Prescription Assistance Programs [place] [facility]; Patient Assistance Programs    [facility] Medications mailed application 3/22   Patient Assistance Programs Medications pt will need [place]il[place] PAP   Home Assistive Devices/Equipment None      Social History:  SDOH Screenings   Alcohol Screen: Not on file  Depression (DXA1-2): Not on file  Financial Resource Strain: High Risk  . Difficulty of Paying Living Expenses: Very hard  Food Insecurity: Food Insecurity Present  . Worried About Charity fundraiser in the Last Year: Sometimes true  . Ran Out of Food in the Last Year: Sometimes true  Housing: High Risk  . Last Housing Risk Score: 2  Physical Activity: Not on file  Social Connections: Not on file  Stress: Stress Concern Present  . Feeling of Stress : To some extent  Tobacco Use: Medium Risk  . Smoking Tobacco Use: Former Smoker  . Smokeless Tobacco Use: Never Used  Transportation Needs: No Transportation Needs  . Lack of Transportation (Medical): No  . Lack of Transportation (Non-Medical): No    SDOH Interventions: Financial Resources:  Financial Strain Interventions: Other (Comment) (pt shares she has submitted applications for disability and medicaid)   Food Insecurity:  Food Insecurity Interventions: Other (Comment) (pt confirms her re-auth has been approved and she is recieving SNAP again)  Housing Insecurity:  Housing Interventions: Other (Comment) (pt still intermittantly staying in her vehicle)  Transportation:   Transportation Interventions: Intervention Not Indicated     Other Care Navigation Interventions:     Provided Pharmacy assistance resources  pt currently able to obtain  medications, reminded her that [facility]acies will be most affordable options for medication assistance   Follow-up plan:   LCSW has f/u with Shanon Rosser, financial counselor for any updates regarding pending Medicaid applications. LCSW will f/u with pt once we have determined if she can apply for any additional assistance. Pt has my number for any f/u questions/concerns.

## 2020-07-10 NOTE — Progress Notes (Signed)
Cardiology Office Note:    Date:  07/10/2020   ID:  Susan Sexton, DOB 1985/12/08, MRN [id number]  PCP:  System, Provider Not In  Cardiologist:  Little Ishikawa, MD  Electrophysiologist:  None   Referring MD: No ref. provider found   Chief Complaint  Patient presents with  . Coronary Artery Disease    History of Present Illness:    Susan Sexton is a 35 y.o. female with a hx of CAD, hyperlipidemia, tobacco use who presents for follow-up.  She was admitted to [facility] on 05/04/2020 with NSTEMI.  She had presented to med [facility] ED with chest pain.  Found to have mildly elevated troponins (peak 156).  Despite her age, she was noted to have significant CAD risk factors as suspected FH given LDL to 56 and family history including father having CABG in his 15s.  Echocardiogram was done on 05/05/2020 which showed normal biventricular function, no significant valvular disease.  LHC on 05/05/2020 showed 99% proximal RCA stenosis, 50% proximal to mid RCA, 20% distal RCA, 40% proximal to mid LCx, 80% proximal to mid LAD.  RCA disease was treated with DES.  Staged PCI to the LAD was done on 05/23/2020.  Underwent DES x2 to proximal/mid LAD.  This was complicated by likely guide catheter dissection of the left main, which was treated with DES.  Since her discharge, she reports that she has been doing okay. She has since learned that when she gets overheated she feels lightheaded and near syncope, and believes there is minor chest pain at these times. She also feels like she needs to take deep breaths when she feels overheated, but is unsure if she is short of breath. She also has some pain and burning with urination, and says her urine has a strong odor. She is concerned she may have a UTI or yeast infection. She has quit smoking completely since her stents were placed. She has not taken the nitroglycerin, because when she has chest discomfort she doesn't know if it is due to overheating or her  heart, and thus doesn't know if she should take the nitroglycerin. She felt overwhelmed after three days on the job, and for the next two days continued to feel sick and fatigued. Denies any palpitations or LE edema, no orthopnea or PND.      Past Medical History:  Diagnosis Date  . Asthma   . Coronary artery disease   . High cholesterol   . Hyperlipidemia LDL goal <100 05/04/2020  . NSTEMI (non-ST elevated myocardial infarction) [facility])     Past Surgical History:  Procedure Laterality Date  . BREAST LUMPECTOMY Left 06/6/219  . CORONARY STENT INTERVENTION N/A 05/05/2020   Procedure: CORONARY STENT INTERVENTION;  Surgeon: Iran Ouch, MD;  Location: [facility] INVASIVE CV LAB;  Service: Cardiovascular;  Laterality: N/A;  . CORONARY STENT INTERVENTION N/A 05/23/2020   Procedure: CORONARY STENT INTERVENTION;  Surgeon: Corky Crafts, MD;  Location: [facility] INVASIVE CV LAB;  Service: Cardiovascular;  Laterality: N/A;  . CORONARY STENT PLACEMENT  05/05/2020  . INCISION AND DRAINAGE BREAST ABSCESS Left 09/2017  . INTRAVASCULAR ULTRASOUND/IVUS N/A 05/23/2020   Procedure: Intravascular Ultrasound/IVUS;  Surgeon: Corky Crafts, MD;  Location: [facility] INVASIVE CV LAB;  Service: Cardiovascular;  Laterality: N/A;  . LEFT HEART CATH AND CORONARY ANGIOGRAPHY N/A 05/05/2020   Procedure: LEFT HEART CATH AND CORONARY ANGIOGRAPHY;  Surgeon: Iran Ouch, MD;  Location: [facility] INVASIVE CV LAB;  Service: Cardiovascular;  Laterality:  N/A;  . LEFT HEART CATH AND CORONARY ANGIOGRAPHY N/A 05/23/2020   Procedure: LEFT HEART CATH AND CORONARY ANGIOGRAPHY;  Surgeon: Corky Crafts, MD;  Location: [facility] INVASIVE CV LAB;  Service: Cardiovascular;  Laterality: N/A;    Current Medications: Current Meds  Medication Sig  . albuterol (VENTOLIN HFA) 108 (90 Base) MCG/ACT inhaler Inhale 2 puffs into the lungs every 6 (six) hours as needed for shortness of breath.  Marland Kitchen aspirin 81 MG chewable tablet Chew 1 tablet (81 mg  total) by mouth daily.  Marland Kitchen atorvastatin (LIPITOR) 80 MG tablet Take 1 tablet (80 mg total) by mouth daily.  . fluconazole (DIFLUCAN) 150 MG tablet Take 1 tablet (150 mg total) by mouth once for 1 dose.  . metoprolol succinate (TOPROL-XL) 25 MG 24 hr tablet Take 0.5 tablets (12.5 mg total) by mouth daily.  . Multiple Vitamins-Minerals (MULTI ADULT GUMMIES PO) Take 1 tablet by mouth daily.  . nitroGLYCERIN (NITROSTAT) 0.4 MG SL tablet Place 1 tablet (0.4 mg total) under the tongue every 5 (five) minutes x 3 doses as needed for chest pain.  . ticagrelor (BRILINTA) 90 MG TABS tablet Take 1 tablet (90 mg total) by mouth 2 (two) times daily.  . [DISCONTINUED] losartan (COZAAR) 25 MG tablet Take 0.5 tablets (12.5 mg total) by mouth daily.     Allergies:   Patient has no known allergies.   Social History   Socioeconomic History  . Marital status: Single    Spouse name: Not on file  . Number of children: Not on file  . Years of education: Not on file  . Highest education level: Not on file  Occupational History  . Not on file  Tobacco Use  . Smoking status: Former Smoker    Packs/day: 0.50    Types: Cigarettes    Quit date: 05/06/2020    Years since quitting: 0.1  . Smokeless tobacco: Never Used  Vaping Use  . Vaping Use: Some days  Substance and Sexual Activity  . Alcohol use: No  . Drug use: No  . Sexual activity: Not on file  Other Topics Concern  . Not on file  Social History Narrative  . Not on file   Social Determinants of Health   Financial Resource Strain: High Risk  . Difficulty of Paying Living Expenses: Very hard  Food Insecurity: Food Insecurity Present  . Worried About Programme researcher, broadcasting/film/video in the Last Year: Sometimes true  . Ran Out of Food in the Last Year: Sometimes true  Transportation Needs: No Transportation Needs  . Lack of Transportation (Medical): No  . Lack of Transportation (Non-Medical): No  Physical Activity: Not on file  Stress: Stress Concern Present   . Feeling of Stress : To some extent  Social Connections: Not on file     Family History: The patient's family history includes Heart attack (age of onset: 23) in her father; Hypertension in her mother.  ROS:   Please see the history of present illness.  (+) Lightheadedness (+) Near-syncope (+) Minor chest pain (+) Painful/burning urination All other systems reviewed and are negative.  EKGs/Labs/Other Studies Reviewed:    The following studies were reviewed today:   EKG:   05/17/2020: normal sinus rhythm, rate 91, no ST/T abnormalities 07/10/2020: normal sinus rhythm, rate 66 bpm, no ST/T abnormalities   Recent Labs: 05/04/2020: TSH 4.381 05/05/2020: ALT 19 05/24/2020: BUN 7; Creatinine, Ser 0.66; Hemoglobin 12.4; Platelets 229; Potassium 3.6; Sodium 136  Recent Lipid Panel  Component Value Date/Time   CHOL 230 (H) 05/05/2020 0012   TRIG 52 05/05/2020 0012   HDL 49 05/05/2020 0012   CHOLHDL 4.7 05/05/2020 0012   VLDL 10 05/05/2020 0012   LDLCALC 171 (H) 05/05/2020 0012    Physical Exam:    VS:  BP 90/72   Pulse 66   Ht 5\' 7"  (1.702 m)   Wt 151 lb 3.2 oz (68.6 kg)   SpO2 99%   BMI 23.68 kg/m     Wt Readings from Last 3 Encounters:  07/10/20 151 lb 3.2 oz (68.6 kg)  05/24/20 143 lb 1.3 oz (64.9 kg)  05/17/20 143 lb 3.2 oz (65 kg)     GEN:  Well nourished, well developed in no acute distress HEENT: Normal NECK: No JVD; No carotid bruits LYMPHATICS: No lymphadenopathy CARDIAC: RRR, no murmurs, rubs, gallops RESPIRATORY:  Clear to auscultation without rales, wheezing or rhonchi  ABDOMEN: Soft, non-tender, non-distended MUSCULOSKELETAL:  No edema; No deformity  SKIN: Warm and dry NEUROLOGIC:  Alert and oriented x 3 PSYCHIATRIC:  Normal affect   ASSESSMENT:    1. CAD in native artery   2. Hyperlipidemia, unspecified hyperlipidemia type   3. Pain with urination   4. Abnormal urine odor   5. Lightheadedness   6. Tobacco use    PLAN:    CAD: Presented  with NSTEMI 05/04/2020.  Echocardiogram was done on 05/05/2020 which showed normal biventricular function, no significant valvular disease.  LHC on 05/05/2020 showed 99% proximal RCA stenosis, 50% proximal to mid RCA, 20% distal RCA, 40% proximal to mid LCx, 80% proximal to mid LAD.  RCA disease was treated with DES.  Staged PCI to the LAD was done on 05/23/2020.  Underwent DES x2 to proximal/mid LAD.  This was complicated by likely guide catheter dissection of the left main, which was treated with DES. -Continue aspirin 81 mg daily, ticagrelor 90 mg twice daily -Continue atorvastatin 80 mg daily -Continue Toprol-XL 12.5 mg daily  Hyperlipidemia: Suspect FH given LDL 282 in 01/2020 and family history including father had CABG in 30s.  Was started on atorvastatin 20 mg daily with improvement in LDL to 171 on 2/25.  Atorvastatin dose increased to 80 mg daily at that time.  Recheck lipid panel today, if not at goal LDL less than 70 will refer to lipid clinic for PCSK9 inhibitor  Lightheadedness: Suspect due to medication use in setting of soft blood pressures.  Discontinue losartan.  Continue low-dose metoprolol as above  Tobacco use: Smoked 1 pack/day x 15 years.  Quit smoking since her stent, encouraged continued cessation  Dysuria/vaginal discharge: Concern for UTI versus vaginitis.  She does not currently have a PCP, but establishes care with PCP in couple weeks.  Will check urinalysis with reflex to culture.  Will treat with fluconazole 150 mg x1.  RTC in 3 months.   Medication Adjustments/Labs and Tests Ordered: Current medicines are reviewed at length with the patient today.  Concerns regarding medicines are outlined above.  Orders Placed This Encounter  Procedures  . Lipid panel  . Urinalysis, Routine w reflex microscopic  . EKG 12-Lead   Meds ordered this encounter  Medications  . fluconazole (DIFLUCAN) 150 MG tablet    Sig: Take 1 tablet (150 mg total) by mouth once for 1 dose.     Dispense:  1 tablet    Refill:  0    Patient Instructions  Medication Instructions:  STOP Losartan  Take fluconazole 150 mg once  *  If you need a refill on your cardiac medications before your next appointment, please call your pharmacy*   Lab Work: Lipid, Urinalysis today  If you have labs (blood work) drawn today and your tests are completely normal, you will receive your results only by: Marland Kitchen. MyChart Message (if you have MyChart) OR . A paper copy in the mail If you have any lab test that is abnormal or we need to change your treatment, we will call you to review the results.  Follow-Up: At [facility], you and your health needs are our priority.  As part of our continuing mission to provide you with exceptional heart care, we have created designated Provider Care Teams.  These Care Teams include your primary Cardiologist (physician) and Advanced Practice Providers (APPs -  Physician Assistants and Nurse Practitioners) who all work together to provide you with the care you need, when you need it.  We recommend signing up for the patient portal called "MyChart".  Sign up information is provided on this After Visit Summary.  MyChart is used to connect with patients for Virtual Visits (Telemedicine).  Patients are able to view lab/test results, encounter notes, upcoming appointments, etc.  Non-urgent messages can be sent to your provider as well.   To learn more about what you can do with MyChart, go to [email or web address].    Your next appointment:   3 month(s)  The format for your next appointment:   In Person  Provider:   Epifanio Lescheshristopher Trameka Dorough, MD       [facility],Mathew Stumpf,acting as a scribe for Little Ishikawahristopher L Markeesha Char, MD.,have documented all relevant documentation on the behalf of Little Ishikawahristopher L Mkayla Steele, MD,as directed by  Little Ishikawahristopher L Johnrobert Foti, MD while in the presence of Little Ishikawahristopher L Coti Burd, MD.  I, Little Ishikawahristopher L Karlisha Mathena, MD, have reviewed all documentation for this  visit. The documentation on 07/10/20 for the exam, diagnosis, procedures, and orders are all accurate and complete.   Signed, Little Ishikawahristopher L Deshayla Empson, MD  07/10/2020 1:16 PM    [facility]

## 2020-07-10 NOTE — Patient Instructions (Addendum)
Medication Instructions:  STOP Losartan  Take fluconazole 150 mg once  *If you need a refill on your cardiac medications before your next appointment, please call your pharmacy*   Lab Work: Lipid, Urinalysis today  If you have labs (blood work) drawn today and your tests are completely normal, you will receive your results only by: Marland Kitchen MyChart Message (if you have MyChart) OR . A paper copy in the mail If you have any lab test that is abnormal or we need to change your treatment, we will call you to review the results.  Follow-Up: At [facility], you and your health needs are our priority.  As part of our continuing mission to provide you with exceptional heart care, we have created designated Provider Care Teams.  These Care Teams include your primary Cardiologist (physician) and Advanced Practice Providers (APPs -  Physician Assistants and Nurse Practitioners) who all work together to provide you with the care you need, when you need it.  We recommend signing up for the patient portal called "MyChart".  Sign up information is provided on this After Visit Summary.  MyChart is used to connect with patients for Virtual Visits (Telemedicine).  Patients are able to view lab/test results, encounter notes, upcoming appointments, etc.  Non-urgent messages can be sent to your provider as well.   To learn more about what you can do with MyChart, go to [email or web address].    Your next appointment:   3 month(s)  The format for your next appointment:   In Person  Provider:   Epifanio Lesches, MD

## 2020-07-11 LAB — LIPID PANEL
Chol/HDL Ratio: 3.8 ratio (ref 0.0–4.4)
Cholesterol, Total: 244 mg/dL — ABNORMAL HIGH (ref 100–199)
HDL: 65 mg/dL (ref >39)
LDL Chol Calc (NIH): 169 mg/dL — ABNORMAL HIGH (ref 0–99)
Triglycerides: 59 mg/dL (ref 0–149)
VLDL Cholesterol Cal: 10 mg/dL (ref 5–40)

## 2020-07-11 LAB — URINALYSIS, ROUTINE W REFLEX MICROSCOPIC
Bilirubin, UA: NEGATIVE
Glucose, UA: NEGATIVE
Ketones, UA: NEGATIVE
Leukocytes,UA: NEGATIVE
Nitrite, UA: NEGATIVE
Protein,UA: NEGATIVE
RBC, UA: NEGATIVE
Specific Gravity, UA: 1.022 (ref 1.005–1.030)
Urobilinogen, Ur: 0.2 mg/dL (ref 0.2–1.0)
pH, UA: 5.5 (ref 5.0–7.5)

## 2020-07-12 ENCOUNTER — Telehealth: Payer: Self-pay | Admitting: Licensed Clinical Social Worker

## 2020-07-12 NOTE — Telephone Encounter (Signed)
Confirmed that pt has pending Medicaid and Disability applications. Medicaid was submitted on 06/30/20 w/ caseworker Colan Neptune. Unable to submit CAFA or [place] Card at this time since [place] application is pending. If denied then we can submit those assistance applications.   Susan Sexton, MSW, LCSW [facility] Health Heart/Vascular Care Navigation  [phone]

## 2020-07-13 ENCOUNTER — Ambulatory Visit: Payer: Self-pay | Admitting: Cardiology

## 2020-07-17 ENCOUNTER — Telehealth: Payer: Self-pay | Admitting: Licensed Clinical Social Worker

## 2020-07-17 NOTE — Telephone Encounter (Signed)
LCSW sent text message update to pt (approved by pt), sharing that pt application is pending with Medicaid office therefore additional applications are on hold at this time. Encouraged pt to reach out to me if she does hear a determination/is denied so we can start on those applications at that time.   Octavio Graves, MSW, LCSW [facility] Heart/Vascular Care Navigation  [phone]

## 2020-07-24 ENCOUNTER — Telehealth: Payer: Self-pay | Admitting: Nurse Practitioner

## 2020-07-24 ENCOUNTER — Other Ambulatory Visit: Payer: Self-pay

## 2020-07-24 ENCOUNTER — Telehealth: Payer: Self-pay | Admitting: Cardiology

## 2020-07-24 ENCOUNTER — Ambulatory Visit: Payer: Self-pay | Attending: Nurse Practitioner | Admitting: Nurse Practitioner

## 2020-07-24 ENCOUNTER — Telehealth: Payer: Self-pay | Admitting: Licensed Clinical Social Worker

## 2020-07-24 ENCOUNTER — Encounter: Payer: Self-pay | Admitting: Nurse Practitioner

## 2020-07-24 DIAGNOSIS — J4520 Mild intermittent asthma, uncomplicated: Secondary | ICD-10-CM

## 2020-07-24 DIAGNOSIS — Z7689 Persons encountering health services in other specified circumstances: Secondary | ICD-10-CM

## 2020-07-24 MED ORDER — BENZONATATE 100 MG PO CAPS
100.0000 mg | ORAL_CAPSULE | Freq: Two times a day (BID) | ORAL | 0 refills | Status: DC | PRN
  Filled 2020-07-24: qty 40, 20d supply, fill #0

## 2020-07-24 MED ORDER — ALBUTEROL SULFATE HFA 108 (90 BASE) MCG/ACT IN AERS
2.0000 | INHALATION_SPRAY | Freq: Four times a day (QID) | RESPIRATORY_TRACT | 1 refills | Status: AC | PRN
  Filled 2020-07-24 (×2): qty 18, 25d supply, fill #0

## 2020-07-24 MED ORDER — TICAGRELOR 90 MG PO TABS
90.0000 mg | ORAL_TABLET | Freq: Two times a day (BID) | ORAL | 1 refills | Status: DC
  Filled 2020-07-24: qty 60, 30d supply, fill #0

## 2020-07-24 MED ORDER — TICAGRELOR 90 MG PO TABS: 90.0000 mg | ORAL_TABLET | Freq: Two times a day (BID) | ORAL | 0 refills | Status: DC

## 2020-07-24 NOTE — Telephone Encounter (Signed)
Clarified with Elease Hashimoto, CMA, that pt not able to apply for [facility] Financial Aid or [employer] at this time (which was communicated specifically w/ pt on 5/9). This is due to pending Medicaid application as of 4/22. Pt is able to apply for Brilinta PAP while waiting on this determination.   Octavio Graves, MSW, LCSW [facility] Health Heart/Vascular Care Navigation  [phone]

## 2020-07-24 NOTE — Telephone Encounter (Signed)
Please send to [place]

## 2020-07-24 NOTE — Progress Notes (Signed)
Virtual Visit via Telephone Note Due to national recommendations of social distancing due to COVID 19, telehealth visit is felt to be most appropriate for this patient at this time.  I discussed the limitations, risks, security and privacy concerns of performing an evaluation and management service by telephone and the availability of in person appointments. I also discussed with the patient that there may be a patient responsible charge related to this service. The patient expressed understanding and agreed to proceed.    I connected with Susan Sexton on 07/24/20  at   9:30 AM EDT  EDT by telephone and verified that I am speaking with the correct person using two identifiers.   Consent I discussed the limitations, risks, security and privacy concerns of performing an evaluation and management service by telephone and the availability of in person appointments. I also discussed with the patient that there may be a patient responsible charge related to this service. The patient expressed understanding and agreed to proceed.   Location of Patient: Private Residence   Location of Provider: [facility] [employer] [facility]    Persons participating in Telemedicine visit: Bertram Denver FNP-BC [place] [place] CMA Susan Sexton    History of Present Illness: Telemedicine visit for: Establish Care She has a PMH of CAD, HPL,  Asthma, Tobacco dependence, recent NSTEMI (04-2020) with RCA stenting Patient has been counseled on age-appropriate routine health concerns for screening and prevention. These are reviewed and up-to-date. Referrals have been placed accordingly. Immunizations are up-to-date or declined.      She is currently being followed by Cardiology. Awaiting medicaid determination. Taking ASA 81mg  daily, atorvastatin 80 mg daily, and Brillinta 90 mg BID.   Cold Symptoms Notes Increased cough and sputum production.Sore throat however taking OTC cough drops are helping to relieve her  symptoms. She does endorse rhinorrhea but attributes this to her allergies. Denies fever. She is not vaccinated. Endorses negative COVID test.  She does have a history of asthma. She is currently using her mother's albuterol inhaler.   States she is unable to pick up any of her medications from the [facility] pharmacy today. She is currently in [facility] and can not get back to [place] right now due to her grandmother being in hospice.    Past Medical History:  Diagnosis Date  . Asthma   . Coronary artery disease   . High cholesterol   . Hyperlipidemia LDL goal <100 05/04/2020  . NSTEMI (non-ST elevated myocardial infarction) [facility])     Past Surgical History:  Procedure Laterality Date  . BREAST LUMPECTOMY Left 06/6/219  . CORONARY STENT INTERVENTION N/A 05/05/2020   Procedure: CORONARY STENT INTERVENTION;  Surgeon: 05/07/2020, MD;  Location: [facility] INVASIVE CV LAB;  Service: Cardiovascular;  Laterality: N/A;  . CORONARY STENT INTERVENTION N/A 05/23/2020   Procedure: CORONARY STENT INTERVENTION;  Surgeon: 05/25/2020, MD;  Location: [facility] INVASIVE CV LAB;  Service: Cardiovascular;  Laterality: N/A;  . CORONARY STENT PLACEMENT  05/05/2020  . INCISION AND DRAINAGE BREAST ABSCESS Left 09/2017  . INTRAVASCULAR ULTRASOUND/IVUS N/A 05/23/2020   Procedure: Intravascular Ultrasound/IVUS;  Surgeon: 05/25/2020, MD;  Location: [facility] INVASIVE CV LAB;  Service: Cardiovascular;  Laterality: N/A;  . LEFT HEART CATH AND CORONARY ANGIOGRAPHY N/A 05/05/2020   Procedure: LEFT HEART CATH AND CORONARY ANGIOGRAPHY;  Surgeon: 05/07/2020, MD;  Location: [facility] INVASIVE CV LAB;  Service: Cardiovascular;  Laterality: N/A;  . LEFT HEART CATH AND CORONARY ANGIOGRAPHY N/A 05/23/2020  Procedure: LEFT HEART CATH AND CORONARY ANGIOGRAPHY;  Surgeon: Corky Crafts, MD;  Location: [facility] INVASIVE CV LAB;  Service: Cardiovascular;  Laterality: N/A;    Family History  Problem Relation Age of Onset  . Hypertension Mother    . Heart attack Father 44       Had CABG, but died at age 55 after trauma    Social History   Socioeconomic History  . Marital status: Single    Spouse name: Not on file  . Number of children: Not on file  . Years of education: Not on file  . Highest education level: Not on file  Occupational History  . Not on file  Tobacco Use  . Smoking status: Former Smoker    Packs/day: 0.50    Types: Cigarettes    Quit date: 05/06/2020    Years since quitting: 0.2  . Smokeless tobacco: Never Used  Vaping Use  . Vaping Use: Some days  Substance and Sexual Activity  . Alcohol use: No  . Drug use: No  . Sexual activity: Not on file  Other Topics Concern  . Not on file  Social History Narrative  . Not on file   Social Determinants of Health   Financial Resource Strain: High Risk  . Difficulty of Paying Living Expenses: Very hard  Food Insecurity: Food Insecurity Present  . Worried About Programme researcher, broadcasting/film/video in the Last Year: Sometimes true  . Ran Out of Food in the Last Year: Sometimes true  Transportation Needs: No Transportation Needs  . Lack of Transportation (Medical): No  . Lack of Transportation (Non-Medical): No  Physical Activity: Not on file  Stress: Stress Concern Present  . Feeling of Stress : To some extent  Social Connections: Not on file     Observations/Objective: Awake, alert and oriented x 3   Review of Systems  Constitutional: Negative for fever, malaise/fatigue and weight loss.  HENT: Positive for sore throat. Negative for nosebleeds.   Eyes: Negative.  Negative for blurred vision, double vision and photophobia.  Respiratory: Positive for cough. Negative for shortness of breath and wheezing.   Cardiovascular: Negative.  Negative for chest pain, palpitations and leg swelling.  Gastrointestinal: Negative.  Negative for heartburn, nausea and vomiting.  Musculoskeletal: Negative.  Negative for myalgias.  Neurological: Negative.  Negative for dizziness, focal  weakness, seizures and headaches.  Psychiatric/Behavioral: Negative.  Negative for suicidal ideas.    Assessment and Plan: Kinza was seen today for hospitalization follow-up.  Diagnoses and all orders for this visit:  Encounter to establish care  Mild intermittent asthma without complication -     albuterol (VENTOLIN HFA) 108 (90 Base) MCG/ACT inhaler; Inhale 2 puffs into the lungs every 6 (six) hours as needed for wheezing or shortness of breath. NEEDS PASS -     benzonatate (TESSALON) 100 MG capsule; Take 1 capsule (100 mg total) by mouth 2 (two) times daily as needed for cough. NEEDS PASS     Follow Up Instructions Return for PAP SMEAR .     I discussed the assessment and treatment plan with the patient. The patient was provided an opportunity to ask questions and all were answered. The patient agreed with the plan and demonstrated an understanding of the instructions.   The patient was advised to call back or seek an in-person evaluation if the symptoms worsen or if the condition fails to improve as anticipated.  I provided 18 minutes of non-face-to-face time during this encounter including median intraservice  time, reviewing previous notes, labs, imaging, medications and explaining diagnosis and management.  Gildardo Pounds, FNP-BC

## 2020-07-24 NOTE — Telephone Encounter (Signed)
Pt called in and stated this med is going to cost her $475 and she can not afford to pick it up .  Just fyi, she Is going to try to get gas money to get to the office tomorrow to pick up the samples.  She is not sure what she needs to do if she cant get to the office    Pt c/o medication issue:  1. Name of Medication: Brilinta   2. How are you currently taking this medication (dosage and times per day)? 1 tab by month 2 time daily   3. Are you having a reaction (difficulty breathing--STAT)? No   4. What is your medication issue?

## 2020-07-24 NOTE — Telephone Encounter (Signed)
Called [employer], no patient assistance on file for Brilinta, spoke with Hayley, RN, and she said they had not sent in anything for pt either. [employer] application highlighted where pt needs to complete her portion so we can send it in. Unfortunately Brilinta is not on the formulary for [place] [employer] or alternate patient assistance programs. May be most affordable through [facility]. Pt is currently Medicaid pending.

## 2020-07-24 NOTE — Progress Notes (Signed)
Had cold for 2 weeks, covid test negative.

## 2020-07-24 NOTE — Telephone Encounter (Signed)
Called pt and advised samples and applications placed up front for pick up. Pt verbalized understanding and state she will have a friend pick up medication for her.  Brilinta 90 mg Qty: 4 bottles  Lot # [id number] Exp: 06/09/22

## 2020-07-24 NOTE — Telephone Encounter (Signed)
Patient called to ask that the cough medicine that the doctor prescribed (she did not know the name of med) be sent to her local pharmacy instead of [facility] as soon as possible.  She stated that her cough is bad and she really needs something soon.  Please advise and call patient to discuss at [phone]

## 2020-07-24 NOTE — Telephone Encounter (Signed)
Spoke to pt. She report she is unable to afford brilinta due to currently being unemployed. She also report she is already tried patient assistance but was denied. Pt state she currently only have two pills left but unable to pick up samples because she is out of town.   Pt on chart review, social worker previously working on assistance. Will forward for review.

## 2020-07-24 NOTE — Telephone Encounter (Signed)
I had spoke with this patient and she wanted the prescription to be sent to [facility] in [facility]. She did not provide the address because she did not know. Patient can be reached at [phone]

## 2020-07-24 NOTE — Telephone Encounter (Signed)
*  STAT* If patient is at the pharmacy, call can be transferred to refill team.   1. Which medications need to be refilled? (please list name of each medication and dose if known) metoprolol   2. Which pharmacy/location (including street and city if local pharmacy) is medication to be sent to? [facility]  In [place]   3. Do they need a 30 day or 90 day supply? 60 patient is out of town and would like medication to be sent to [facility] she not have a number or a address.

## 2020-07-24 NOTE — Telephone Encounter (Signed)
Pt c/o medication issue: 1. Name of Medication: Bulliant   2. How are you currently taking this medication (dosage and times per day)? 2 time a day  3. Are you having a reaction (difficulty breathing--STAT)?  no 4. What is your medication issue? Patient needs assistant

## 2020-07-24 NOTE — Telephone Encounter (Signed)
Called pt and informed social worker contacted pt assistance program but no current application on file. Nurse advised that the only option is to come to office to pick up samples and provide patient assistance applications. Pt state she doesn't think she will be able to come today. However, pt state she has an 80% off coupon that she was told she could use. Pt requesting a 30 day refill sent to [facility] in [place], [place]. Temporary Rx sent.

## 2020-07-25 ENCOUNTER — Other Ambulatory Visit: Payer: Self-pay

## 2020-07-25 MED ORDER — BENZONATATE 100 MG PO CAPS: 100.0000 mg | ORAL_CAPSULE | Freq: Two times a day (BID) | ORAL | 0 refills | Status: DC | PRN

## 2020-07-25 NOTE — Telephone Encounter (Signed)
Rx sent to requested pharmacy

## 2020-07-26 ENCOUNTER — Telehealth: Payer: Self-pay | Admitting: Cardiology

## 2020-07-26 ENCOUNTER — Other Ambulatory Visit: Payer: Self-pay | Admitting: *Deleted

## 2020-07-26 DIAGNOSIS — E785 Hyperlipidemia, unspecified: Secondary | ICD-10-CM

## 2020-07-26 NOTE — Telephone Encounter (Signed)
Pt c/o medication issue:  1. Name of Medication: ticagrelor (BRILINTA) 90 MG TABS tablet  2. How are you currently taking this medication (dosage and times per day)? unknown  3. Are you having a reaction (difficulty breathing--STAT)? NO  4. What is your medication issue? PT is calling she questions in regards to the form she was given to get a discounted rate on the medication.

## 2020-07-26 NOTE — Telephone Encounter (Signed)
Returned call to patient who was calling in with questions regarding her patient assistance forms for her Brillinta. Patient asking if she needs to fill out the prescriber portion of the form. Advised patient to return the from with the information for patient to fill out and that we will fill out the prescriber portion and fax off to the company for her. Patient verbalized understanding and will send form back to office when completed.   Advised patient to call back to office with any issues, questions, or concerns. Patient verbalized understanding.

## 2020-07-26 NOTE — Telephone Encounter (Signed)
Spoke to patient, aware to notify when she drops paperwork off so can complete and fax to company.   Patient verbalized understanding.

## 2020-08-01 ENCOUNTER — Other Ambulatory Visit: Payer: Self-pay

## 2020-08-03 ENCOUNTER — Telehealth: Payer: Self-pay | Admitting: Cardiology

## 2020-08-03 NOTE — Telephone Encounter (Signed)
*  STAT* If patient is at the pharmacy, call can be transferred to refill team.   1. Which medications need to be refilled? (please list name of each medication and dose if known) metoprolol succinate (TOPROL-XL) 25 MG 24 hr tablet  2. Which pharmacy/location (including street and city if local pharmacy) is medication to be sent to? [facility]  3. Do they need a 30 day or 90 day supply? 30 day   Patient has 1 tablet left.

## 2020-08-04 ENCOUNTER — Encounter (HOSPITAL_COMMUNITY): Payer: Self-pay

## 2020-08-04 ENCOUNTER — Telehealth (HOSPITAL_COMMUNITY): Payer: Self-pay

## 2020-08-04 ENCOUNTER — Telehealth: Payer: Self-pay | Admitting: Cardiology

## 2020-08-04 MED ORDER — TICAGRELOR 90 MG PO TABS: 90.0000 mg | ORAL_TABLET | Freq: Two times a day (BID) | ORAL | 0 refills | Status: AC

## 2020-08-04 NOTE — Telephone Encounter (Signed)
Patient calling the office for samples of medication:   1.  What medication and dosage are you requesting samples for? ticagrelor (BRILINTA) 90 MG TABS tablet   2.  Are you currently out of this medication? Yes    

## 2020-08-04 NOTE — Telephone Encounter (Signed)
Attempted to call patient in regards to Cardiac Rehab - LM on VM Mailed letter 

## 2020-08-04 NOTE — Telephone Encounter (Signed)
Patient aware we have samples for pick up 2 bottles

## 2020-08-08 NOTE — Telephone Encounter (Signed)
Patient assistance signed and faxed to [employer]

## 2020-08-10 ENCOUNTER — Telehealth: Payer: Self-pay | Admitting: Cardiology

## 2020-08-10 NOTE — Telephone Encounter (Signed)
PT is calling confirm when is she going to be scheduled for cardiac rehab

## 2020-08-10 NOTE — Telephone Encounter (Signed)
Called informed patient . records indicated someone from Cardiac REhab called patient 08/04/20- left a message and mailed letter.  RN gave patient cardiac rehab phone number for patient to contact starting cardiac rehab  Patient verbalized understanding.

## 2020-08-10 NOTE — Telephone Encounter (Signed)
Received fax from [employer]  Patient approved 08/09/20-08/08/21  Patient ID: [id number]

## 2020-08-17 ENCOUNTER — Telehealth: Payer: Self-pay | Admitting: Cardiology

## 2020-08-17 NOTE — Telephone Encounter (Signed)
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 She states that she has not taken her metoprolol "for a couple days" and she needs a refill. Upon review of her bottles of medication she states that on the label she does have refills and she will call and have filled today so she can re-start. Pt states that she "is fine for now" and she will call back over the weekend or go to the ER if necessary. No assistance needed at this time per pt.

## 2020-08-17 NOTE — Telephone Encounter (Signed)
Received message from after hours answering service stating " Pt wants to get her cholesterol checked/ has questions about feeling tired and her medications / didn't go to work today because she didn't feel good. Please call back in AM" Please advise.

## 2020-08-18 ENCOUNTER — Telehealth (HOSPITAL_COMMUNITY): Payer: Self-pay | Admitting: Student-PharmD

## 2020-08-18 ENCOUNTER — Other Ambulatory Visit (HOSPITAL_COMMUNITY): Payer: Self-pay

## 2020-08-18 NOTE — Telephone Encounter (Signed)
Cardiac Rehab Medication Review by a Pharmacist  Does the patient  feel that his/her medications are working for him/her?  yes  Has the patient been experiencing any side effects to the medications prescribed?  no  Does the patient measure his/her own blood pressure or blood glucose at home?  yes - 'normal' lately (green indication on machine)  Does the patient have any problems obtaining medications due to transportation or finances?   no  Understanding of regimen: good Understanding of indications: good Potential of compliance: good  Yvetta Coder, PharmD PGY1 Acute Care Pharmacy Resident Please refer to [facility] for unit-specific pharmacist

## 2020-08-23 ENCOUNTER — Other Ambulatory Visit (HOSPITAL_COMMUNITY): Payer: Self-pay

## 2020-08-25 ENCOUNTER — Other Ambulatory Visit (HOSPITAL_COMMUNITY): Payer: Self-pay

## 2020-08-25 MED FILL — Metoprolol Succinate Tab ER 24HR 25 MG (Tartrate Equiv): ORAL | 14 days supply | Qty: 7 | Fill #0 | Status: AC

## 2020-08-29 ENCOUNTER — Telehealth: Payer: Self-pay | Admitting: Pharmacist

## 2020-08-29 ENCOUNTER — Ambulatory Visit: Payer: Self-pay

## 2020-08-29 DIAGNOSIS — E785 Hyperlipidemia, unspecified: Secondary | ICD-10-CM | POA: Insufficient documentation

## 2020-08-29 NOTE — Progress Notes (Deleted)
Patient ID: MARIANNE GOLIGHTLY                 DOB: Dec 11, 1985                    MRN: [id number]     HPI: ZOEYA GRAMAJO is a 35 y.o. female patient referred to lipid clinic by Dr Bjorn Pippin. PMH is significant for hyperlipidemia, asthma, tobacco abuse, and CAD s/p NSTEMI.   Current Medications:   Intolerances:   LDL goal: < 70mg /dL  Diet:   Exercise:   Family History: family history includes Heart attack (age of onset: 19) in her father; Hypertension in her mother.    Social History: current smoker  Labs:  Past Medical History:  Diagnosis Date   Asthma    Coronary artery disease    High cholesterol    Hyperlipidemia LDL goal <100 05/04/2020   NSTEMI (non-ST elevated myocardial infarction) [facility])     Current Outpatient Medications on File Prior to Visit  Medication Sig Dispense Refill   albuterol (VENTOLIN HFA) 108 (90 Base) MCG/ACT inhaler Inhale 2 puffs into the lungs every 6 (six) hours as needed for wheezing or shortness of breath. NEEDS PASS (Patient not taking: Reported on 08/18/2020) 18 g 1   aspirin 81 MG chewable tablet Chew 1 tablet (81 mg total) by mouth daily.     atorvastatin (LIPITOR) 80 MG tablet Take 1 tablet (80 mg total) by mouth daily. 90 tablet 3   benzonatate (TESSALON) 100 MG capsule Take 1 capsule (100 mg total) by mouth 2 (two) times daily as needed for cough. NEEDS PASS (Patient not taking: Reported on 08/18/2020) 40 capsule 0   metoprolol succinate (TOPROL-XL) 25 MG 24 hr tablet Take 0.5 tablets (12.5 mg total) by mouth daily. 30 tablet 11   metoprolol succinate (TOPROL-XL) 25 MG 24 hr tablet Take 0.5 tablets (12.5 mg total) by mouth daily. 30 tablet 11   Multiple Vitamins-Minerals (MULTI ADULT GUMMIES PO) Take 1 tablet by mouth daily.     nitroGLYCERIN (NITROSTAT) 0.4 MG SL tablet Place 1 tablet (0.4 mg total) under the tongue every 5 (five) minutes x 3 doses as needed for chest pain. (Patient not taking: Reported on 08/18/2020) 25 tablet 2   ticagrelor  (BRILINTA) 90 MG TABS tablet Take 1 tablet (90 mg total) by mouth 2 (two) times daily. Further refills by Cardiology. Needs PASS 16 tablet 0   No current facility-administered medications on file prior to visit.    No Known Allergies  No problem-specific Assessment & Plan notes found for this encounter.   Ashanti Ratti Rodriguez-Guzman PharmD, BCPS, CPP [facility] [address] [place] 08/29/2020 10:19 AM

## 2020-08-29 NOTE — Telephone Encounter (Signed)
Patient running late for her appointment with Lipid Clinic and will like to re-schedule.  When asked about her Brilinta, patient stated patient assistance mailed some , but she is currently out of the medication and already missed morning dose.  Sample provided for patient to pick up TODAY before end business day. Appointment re-scheduled for Thursday 6/23 at 8am   Medication Samples have been provided to the patient.  Drug name:Brilinta     Strength: 90mg        Qty: 2 Exp.Date: 06/09/2022

## 2020-08-31 ENCOUNTER — Telehealth: Payer: Self-pay | Admitting: Licensed Clinical Social Worker

## 2020-08-31 ENCOUNTER — Ambulatory Visit: Payer: Self-pay

## 2020-08-31 NOTE — Telephone Encounter (Signed)
LCSW received call from [place], [place] caseworker with [facility] DSS. She shares that she has not heard back from pt to complete application process. LCSW offered to reach out to pt. Barbie agreeable and requested I provide pt with her phone number [phone].   I texted pt at her phone [phone] and provided caseworker name/number and made the request for her to reach out to caseworker to complete screening, message was read by pt.   Octavio Graves, MSW, LCSW [facility] Health Heart/Vascular Care Navigation  [phone]

## 2020-08-31 NOTE — Progress Notes (Deleted)
08/31/2020 Susan Sexton 1986/01/21 [id number]   HPI:  Susan Sexton is a 35 y.o. female patient of Dr Bjorn Pippin, who presents today for a lipid clinic evaluation.  See pertinent past medical history below.  Ms Susan Sexton was admitted to [facility] in February of this year with NSTEMI.  Heart cath showed 99% proximal RCA stenosis as well as 80% proxima to mid LAD, with other lesser stenoses.  EF was 60-65%.  RCA was stented, and recommended return for staged LAD stenting.  Could not complete at that time due to difficulty with sedation.  Several weeks later patient was re-admitted and successful staged LAD stenting was performed.  She was started on atorvastatin 80 mg daily and LDL has dropped from baseline of 282 down to 169   Current Medications: atorvastatin 80  Cholesterol Goals: LDL < 70  Family history:   Diet:   Exercise:    Labs: 11/21:  TC 360, HDL 63.2, LDL 282 (baseline untreated)  5/22:  TC 244, TG 59, HDL 65, LDL 169 (atorvastatin 80 mg)   Current Outpatient Medications  Medication Sig Dispense Refill   albuterol (VENTOLIN HFA) 108 (90 Base) MCG/ACT inhaler Inhale 2 puffs into the lungs every 6 (six) hours as needed for wheezing or shortness of breath. NEEDS PASS (Patient not taking: Reported on 08/18/2020) 18 g 1   aspirin 81 MG chewable tablet Chew 1 tablet (81 mg total) by mouth daily.     atorvastatin (LIPITOR) 80 MG tablet Take 1 tablet (80 mg total) by mouth daily. 90 tablet 3   benzonatate (TESSALON) 100 MG capsule Take 1 capsule (100 mg total) by mouth 2 (two) times daily as needed for cough. NEEDS PASS (Patient not taking: Reported on 08/18/2020) 40 capsule 0   metoprolol succinate (TOPROL-XL) 25 MG 24 hr tablet Take 0.5 tablets (12.5 mg total) by mouth daily. 30 tablet 11   metoprolol succinate (TOPROL-XL) 25 MG 24 hr tablet Take 0.5 tablets (12.5 mg total) by mouth daily. 30 tablet 11   Multiple Vitamins-Minerals (MULTI ADULT GUMMIES PO) Take 1 tablet by mouth daily.      nitroGLYCERIN (NITROSTAT) 0.4 MG SL tablet Place 1 tablet (0.4 mg total) under the tongue every 5 (five) minutes x 3 doses as needed for chest pain. (Patient not taking: Reported on 08/18/2020) 25 tablet 2   ticagrelor (BRILINTA) 90 MG TABS tablet Take 1 tablet (90 mg total) by mouth 2 (two) times daily. Further refills by Cardiology. Needs PASS 16 tablet 0   No current facility-administered medications for this visit.    No Known Allergies  Past Medical History:  Diagnosis Date   Asthma    Coronary artery disease    High cholesterol    Hyperlipidemia LDL goal <100 05/04/2020   NSTEMI (non-ST elevated myocardial infarction) (HCC)     There were no vitals taken for this visit.   No problem-specific Assessment & Plan notes found for this encounter.   Phillips Hay PharmD CPP [facility] [address] [place], [place] [address] [phone]

## 2020-09-05 ENCOUNTER — Telehealth (HOSPITAL_COMMUNITY): Payer: Self-pay | Admitting: *Deleted

## 2020-09-05 ENCOUNTER — Ambulatory Visit (HOSPITAL_COMMUNITY): Payer: Self-pay

## 2020-09-05 NOTE — Telephone Encounter (Signed)
Spoke with the patient she is out of town in [place] and will not be able to attend orientation this morning. Will cancel appointment. Support staff will contact the patient about rescheduling Ms Susan Sexton's appointment.Gladstone Lighter, RN,BSN 09/05/2020 9:05 AM

## 2020-09-13 ENCOUNTER — Telehealth (HOSPITAL_COMMUNITY): Payer: Self-pay

## 2020-09-13 NOTE — Telephone Encounter (Signed)
Pt returned CR phone call and stated she still is interested in our VCR program. Adv pt we will be in contact with her at a later date to get her schedule for CR.

## 2020-09-13 NOTE — Telephone Encounter (Signed)
Attempted to call patient in regards to Cardiac Rehab - LM on VM Mailed letter 

## 2020-09-28 ENCOUNTER — Other Ambulatory Visit: Payer: Self-pay

## 2020-09-28 ENCOUNTER — Ambulatory Visit (INDEPENDENT_AMBULATORY_CARE_PROVIDER_SITE_OTHER): Payer: Self-pay | Admitting: Pharmacist

## 2020-09-28 ENCOUNTER — Telehealth: Payer: Self-pay

## 2020-09-28 VITALS — BP 112/76 | HR 77 | Resp 15 | Ht 68.0 in | Wt 164.6 lb

## 2020-09-28 DIAGNOSIS — I214 Non-ST elevation (NSTEMI) myocardial infarction: Secondary | ICD-10-CM

## 2020-09-28 DIAGNOSIS — E785 Hyperlipidemia, unspecified: Secondary | ICD-10-CM

## 2020-09-28 MED ORDER — ATORVASTATIN CALCIUM 80 MG PO TABS
80.0000 mg | ORAL_TABLET | Freq: Every day | ORAL | 3 refills | Status: DC
  Filled 2020-09-28 – 2020-10-11 (×2): qty 30, 30d supply, fill #0
  Filled 2020-10-30 – 2021-02-09 (×2): qty 30, 30d supply, fill #1

## 2020-09-28 MED ORDER — REPATHA SURECLICK 140 MG/ML ~~LOC~~ SOAJ
1.0000 mL | SUBCUTANEOUS | 5 refills | Status: DC
  Filled 2020-09-28 – 2020-10-11 (×2): qty 2, 28d supply, fill #0
  Filled 2021-02-09: qty 2, 28d supply, fill #1
  Filled 2021-03-06: qty 2, 28d supply, fill #2
  Filled 2021-04-16 – 2021-04-26 (×3): qty 2, 28d supply, fill #0

## 2020-09-28 NOTE — Telephone Encounter (Signed)
Mailed pt assistance forms for repatha sureclick 140mg 

## 2020-09-28 NOTE — Progress Notes (Signed)
Patient ID: Susan Sexton                 DOB: 11-14-1985                    MRN: [id number]     HPI: Susan Sexton is a 35 y.o. female patient referred to lipid clinic by Dr Susan Sexton. PMH is significant for CAD, NSTEM, HLD, and tobacco abuse.  Patient admitted on 3/15 for NSTEMI and underwent PCI/DES x3.  Now on DAPT.   At last visit with Dr. Bjorn Sexton atorvastatin was increased to 80mg  daily. Suspected FH since father had CABG in 30s and LDL had been as elevated as high as 282.  Patient referred to lipid clinic for consideration of PCSK9i.  Patient presents today to discuss cholesterol. Continues to have multiple SDOH issues.  Most nights she sleeps in her car. Occasionally can stay at friends house or at but landlord does not like more than 1 person staying in her one bedroom apartment.  Has not been working since hospitalization, needs a return to note work.  Has quit smoking and is not using illicit drugs.  Patient tearful in appointment.  Qualified for no cost Brilinta, however other medications remain a significant financial burden for her although she reports compliance.  Is established now with [employer] [facility] and had one virtual visit.  Has not been seen in person yet.  Reports social work was attempting to help her with medications and housing but she does not know the status yet.  Has applied for Medicaid but also has not received a determination.  Current Medications: atorvastatin 80mg  daily Intolerances: n/a Risk Factors: CAD, angina, NSTEMI, tobacco abuse LDL goal: <70  Labs: TC 244, LDL 169, Trigs 59, HDL 65 (rosuvastatin 80mg  (07/10/20))  Past Medical History:  Diagnosis Date   Asthma    Coronary artery disease    High cholesterol    Hyperlipidemia LDL goal <100 05/04/2020   NSTEMI (non-ST elevated myocardial infarction) [facility])     Current Outpatient Medications on File Prior to Visit  Medication Sig Dispense Refill   albuterol  (VENTOLIN HFA) 108 (90 Base) MCG/ACT inhaler Inhale 2 puffs into the lungs every 6 (six) hours as needed for wheezing or shortness of breath. NEEDS PASS (Patient not taking: Reported on 08/18/2020) 18 g 1   aspirin 81 MG chewable tablet Chew 1 tablet (81 mg total) by mouth daily.     atorvastatin (LIPITOR) 80 MG tablet Take 1 tablet (80 mg total) by mouth daily. 90 tablet 3   benzonatate (TESSALON) 100 MG capsule Take 1 capsule (100 mg total) by mouth 2 (two) times daily as needed for cough. NEEDS PASS (Patient not taking: Reported on 08/18/2020) 40 capsule 0   metoprolol succinate (TOPROL-XL) 25 MG 24 hr tablet Take 0.5 tablets (12.5 mg total) by mouth daily. 30 tablet 11   metoprolol succinate (TOPROL-XL) 25 MG 24 hr tablet Take 0.5 tablets (12.5 mg total) by mouth daily. 30 tablet 11   Multiple Vitamins-Minerals (MULTI ADULT GUMMIES PO) Take 1 tablet by mouth daily.     nitroGLYCERIN (NITROSTAT) 0.4 MG SL tablet Place 1 tablet (0.4 mg total) under the tongue every 5 (five) minutes x 3 doses as needed for chest pain. (Patient not taking: Reported on 08/18/2020) 25 tablet 2   ticagrelor (BRILINTA) 90 MG TABS tablet Take 1 tablet (90 mg total) by mouth 2 (two) times daily. Further refills  by Cardiology. Needs PASS 16 tablet 0   No current facility-administered medications on file prior to visit.    No Known Allergies  Assessment/Plan:  1. Hyperlipidemia - Patient LDL 169 which is above goal of <70 although much improved since starting atorvastatin.  Patient would benefit significantly from Repatha however cost will likely be a limiting issue as well as access to a refrigerator. Will not be able to qualify for patient assistance until she receives a determination from Medicaid.  Encouraged patient to reach out Medicaid office again.  Would like to return to work at [employer] but needs note.  Has appt with Dr. Bjorn Sexton on 8/2 but offered to send him and his nurse a message regarding return to work note.     Patient requests refill of atorvastatin. Used Repatha demo pen to educate patient on storage, site selection, and administration.  Patient was able to demonstrate in room.  Will send Rx to [facility] but unknown what copay will be.    Continue atorvastatin 80mg  daily Start Repatha 140mg  Q 14 days if possible Recheck with Dr. on 8/2  , PharmD, BCACP, CDCES, CPP [facility] [address] [place], [address] [place] Phone: [phone]; Fax: [phone] 09/28/2020 2:45 PM

## 2020-09-28 NOTE — Telephone Encounter (Signed)
Pt returned call and stated that she spoke w/ms barbie woods but she also has a question regarding housing

## 2020-09-28 NOTE — Patient Instructions (Addendum)
It was nice meeting you today  We would like to get your LDL (bad cholesterol) less than 70  Continue your atorvastatin 80mg  once a day as well as your other medications  Contact Medicaid and see if you can qualify  I will send over a prescription for Repatha which is the injection you will take once every 2 weeks.  If you qualify for Medicaid, it should be affordable  Please call with any questions  , PharmD, BCACP, CDCES, CPP [facility] [address] [place], [place] Phone: [phone]; Fax: [phone] 09/28/2020 2:11 PM

## 2020-09-29 ENCOUNTER — Telehealth: Payer: Self-pay | Admitting: Licensed Clinical Social Worker

## 2020-09-29 NOTE — Telephone Encounter (Signed)
Patient returned call to CSW asking about a housing list/resources. CSW provided list and sent it to patient to e-mail per her request. CSW available if needed. Lasandra Beech, LCSW, CCSW-MCS [phone]

## 2020-09-29 NOTE — Telephone Encounter (Signed)
CSW received message that patient requested a return call regarding a housing question. CSW contacted patient by phone although unable to leave a message as mailbox is full. Please provide CSW number if she should return call. Lasandra Beech, LCSW, CCSW-MCS [phone]

## 2020-10-04 ENCOUNTER — Telehealth: Payer: Self-pay | Admitting: *Deleted

## 2020-10-04 NOTE — Telephone Encounter (Signed)
Received message that patient requested return to work note at last appt with pharmD  Spoke to patient-advised note was provided 4/4 to return to work.  Advised to call if anything further needed.

## 2020-10-05 ENCOUNTER — Other Ambulatory Visit: Payer: Self-pay

## 2020-10-08 NOTE — Progress Notes (Signed)
Cardiology Office Note:    Date:  10/10/2020   ID:  Susan Sexton, Susan Sexton March 19, 1985, MRN [id number]  PCP:  Claiborne Rigg, NP  Cardiologist:  Little Ishikawa, MD  Electrophysiologist:  None   Referring MD: No ref. provider found   Chief Complaint  Patient presents with   Coronary Artery Disease     History of Present Illness:    Susan Sexton is a 35 y.o. female with a hx of CAD, hyperlipidemia, tobacco use who presents for follow-up.  She was admitted to [facility] on 05/04/2020 with NSTEMI.  She had presented to med [facility] ED with chest pain.  Found to have mildly elevated troponins (peak 156).  Despite her age, she was noted to have significant CAD risk factors as suspected FH given LDL to 81 and family history including father having CABG in his 72s.  Echocardiogram was done on 05/05/2020 which showed normal biventricular function, no significant valvular disease.  LHC on 05/05/2020 showed 99% proximal RCA stenosis, 50% proximal to mid RCA, 20% distal RCA, 40% proximal to mid LCx, 80% proximal to mid LAD.  RCA disease was treated with DES.  Staged PCI to the LAD was done on 05/23/2020.  Underwent DES x2 to proximal/mid LAD.  This was complicated by likely guide catheter dissection of the left main, which was treated with DES.  Since last clinic visit, she reports that she has been doing well.  Denies any chest pain, dyspnea, syncope, lower extremity edema, or palpitations.  Does report occasional lightheadedness when she stands too fast.  She has been taking her aspirin and Brilinta, denies any bleeding issues.  She started smoking again, currently smoking a couple cigarettes per day.   Past Medical History:  Diagnosis Date   Asthma    Coronary artery disease    High cholesterol    Hyperlipidemia LDL goal <100 05/04/2020   NSTEMI (non-ST elevated myocardial infarction) [facility])     Past Surgical History:  Procedure Laterality Date   BREAST LUMPECTOMY Left 06/6/219    CORONARY STENT INTERVENTION N/A 05/05/2020   Procedure: CORONARY STENT INTERVENTION;  Surgeon: Iran Ouch, MD;  Location: [facility] INVASIVE CV LAB;  Service: Cardiovascular;  Laterality: N/A;   CORONARY STENT INTERVENTION N/A 05/23/2020   Procedure: CORONARY STENT INTERVENTION;  Surgeon: Corky Crafts, MD;  Location: [facility] INVASIVE CV LAB;  Service: Cardiovascular;  Laterality: N/A;   CORONARY STENT PLACEMENT  05/05/2020   INCISION AND DRAINAGE BREAST ABSCESS Left 09/2017   INTRAVASCULAR ULTRASOUND/IVUS N/A 05/23/2020   Procedure: Intravascular Ultrasound/IVUS;  Surgeon: Corky Crafts, MD;  Location: [facility] INVASIVE CV LAB;  Service: Cardiovascular;  Laterality: N/A;   LEFT HEART CATH AND CORONARY ANGIOGRAPHY N/A 05/05/2020   Procedure: LEFT HEART CATH AND CORONARY ANGIOGRAPHY;  Surgeon: Iran Ouch, MD;  Location: [facility] INVASIVE CV LAB;  Service: Cardiovascular;  Laterality: N/A;   LEFT HEART CATH AND CORONARY ANGIOGRAPHY N/A 05/23/2020   Procedure: LEFT HEART CATH AND CORONARY ANGIOGRAPHY;  Surgeon: Corky Crafts, MD;  Location: [facility] INVASIVE CV LAB;  Service: Cardiovascular;  Laterality: N/A;    Current Medications: Current Meds  Medication Sig   albuterol (VENTOLIN HFA) 108 (90 Base) MCG/ACT inhaler Inhale 2 puffs into the lungs every 6 (six) hours as needed for wheezing or shortness of breath. NEEDS PASS   aspirin 81 MG chewable tablet Chew 1 tablet (81 mg total) by mouth daily.   atorvastatin (LIPITOR) 80 MG tablet Take 1 tablet (80  mg total) by mouth daily.   benzonatate (TESSALON) 100 MG capsule Take 1 capsule (100 mg total) by mouth 2 (two) times daily as needed for cough. NEEDS PASS   Evolocumab (REPATHA SURECLICK) 140 MG/ML SOAJ Inject 1 mL into the skin every 14 (fourteen) days.   metoprolol succinate (TOPROL-XL) 25 MG 24 hr tablet Take 0.5 tablets (12.5 mg total) by mouth daily.   Multiple Vitamins-Minerals (MULTI ADULT GUMMIES PO) Take 1 tablet by mouth daily.    nitroGLYCERIN (NITROSTAT) 0.4 MG SL tablet Place 1 tablet (0.4 mg total) under the tongue every 5 (five) minutes x 3 doses as needed for chest pain.   ticagrelor (BRILINTA) 90 MG TABS tablet Take 1 tablet (90 mg total) by mouth 2 (two) times daily. Further refills by Cardiology. Needs PASS     Allergies:   Patient has no known allergies.   Social History   Socioeconomic History   Marital status: Single    Spouse name: Not on file   Number of children: Not on file   Years of education: Not on file   Highest education level: Not on file  Occupational History   Not on file  Tobacco Use   Smoking status: Former    Packs/day: 0.50    Types: Cigarettes    Quit date: 05/06/2020    Years since quitting: 0.4   Smokeless tobacco: Never  Vaping Use   Vaping Use: Some days  Substance and Sexual Activity   Alcohol use: No   Drug use: No   Sexual activity: Not on file  Other Topics Concern   Not on file  Social History Narrative   Not on file   Social Determinants of Health   Financial Resource Strain: High Risk   Difficulty of Paying Living Expenses: Very hard  Food Insecurity: Food Insecurity Present   Worried About Running Out of Food in the Last Year: Sometimes true   Ran Out of Food in the Last Year: Sometimes true  Transportation Needs: No Transportation Needs   Lack of Transportation (Medical): No   Lack of Transportation (Non-Medical): No  Physical Activity: Not on file  Stress: Stress Concern Present   Feeling of Stress : To some extent  Social Connections: Not on file     Family History: The patient's family history includes Heart attack (age of onset: 54) in her father; Hypertension in her mother.  ROS:   Please see the history of present illness.  (+) Lightheadedness (+) Near-syncope (+) Minor chest pain (+) Painful/burning urination All other systems reviewed and are negative.  EKGs/Labs/Other Studies Reviewed:    The following studies were reviewed  today:   EKG:   05/17/2020: normal sinus rhythm, rate 91, no ST/T abnormalities 07/10/2020: normal sinus rhythm, rate 66 bpm, no ST/T abnormalities   Recent Labs: 05/04/2020: TSH 4.381 05/05/2020: ALT 19 05/24/2020: BUN 7; Creatinine, Ser 0.66; Hemoglobin 12.4; Platelets 229; Potassium 3.6; Sodium 136  Recent Lipid Panel    Component Value Date/Time   CHOL 244 (H) 07/10/2020 1250   TRIG 59 07/10/2020 1250   HDL 65 07/10/2020 1250   CHOLHDL 3.8 07/10/2020 1250   CHOLHDL 4.7 05/05/2020 0012   VLDL 10 05/05/2020 0012   LDLCALC 169 (H) 07/10/2020 1250    Physical Exam:    VS:  BP 120/76 (BP Location: Left Arm)   Pulse 76   Ht 5\' 8"  (1.727 m)   Wt 163 lb (73.9 kg)   SpO2 99%   BMI 24.78 kg/m  Wt Readings from Last 3 Encounters:  10/10/20 163 lb (73.9 kg)  09/28/20 164 lb 9.6 oz (74.7 kg)  07/10/20 151 lb 3.2 oz (68.6 kg)     GEN:  Well nourished, well developed in no acute distress HEENT: Normal NECK: No JVD; No carotid bruits CARDIAC: RRR, no murmurs, rubs, gallops RESPIRATORY:  Clear to auscultation without rales, wheezing or rhonchi  ABDOMEN: Soft, non-tender, non-distended MUSCULOSKELETAL:  No edema; No deformity  SKIN: Warm and dry NEUROLOGIC:  Alert and oriented x 3 PSYCHIATRIC:  Normal affect   ASSESSMENT:    1. CAD in native artery   2. Hyperlipidemia, unspecified hyperlipidemia type   3. Lightheadedness   4. Tobacco use     PLAN:    CAD: Presented with NSTEMI 05/04/2020.  Echocardiogram was done on 05/05/2020 which showed normal biventricular function, no significant valvular disease.  LHC on 05/05/2020 showed 99% proximal RCA stenosis, 50% proximal to mid RCA, 20% distal RCA, 40% proximal to mid LCx, 80% proximal to mid LAD.  RCA disease was treated with DES.  Staged PCI to the LAD was done on 05/23/2020.  Underwent DES x2 to proximal/mid LAD.  This was complicated by likely guide catheter dissection of the left main, which was treated with DES. -Continue  aspirin 81 mg daily, ticagrelor 90 mg twice daily -Continue atorvastatin 80 mg daily.  Started on Repatha 09/2020 but has not filled prescription yet, she is waiting on the results of her Medicaid application -Continue Toprol-XL 12.5 mg daily  Hyperlipidemia: Suspect FH given LDL 282 in 01/2020 and family history including father had CABG in 30s.  Was started on atorvastatin 20 mg daily with improvement in LDL to 171 on 2/25.  Atorvastatin dose increased to 80 mg daily at that time.  LDL 169 on 07/10/2020, referred to lipid clinic for PCSK9 inhibitor, started on Repatha 09/2020 but has not filled prescription yet, she is waiting on the results of her Medicaid application  Lightheadedness: Suspect due to medication use in setting of soft blood pressures.  Discontinued losartan.  Continue low-dose metoprolol as above  Tobacco use: Smoked 1 pack/day x 15 years.  Quit smoking following her stent but has started smoking again, currently smoking a couple cigarettes per day.  Counseled on the risk of tobacco use and cessation strongly encouraged.  RTC in 6 months  Medication Adjustments/Labs and Tests Ordered: Current medicines are reviewed at length with the patient today.  Concerns regarding medicines are outlined above.  No orders of the defined types were placed in this encounter.  No orders of the defined types were placed in this encounter.   Patient Instructions  Medication Instructions:  No Changes In Medications at this time.  *If you need a refill on your cardiac medications before your next appointment, please call your pharmacy*  Follow-Up: At [facility], you and your health needs are our priority.  As part of our continuing mission to provide you with exceptional heart care, we have created designated Provider Care Teams.  These Care Teams include your primary Cardiologist (physician) and Advanced Practice Providers (APPs -  Physician Assistants and Nurse Practitioners) who all work  together to provide you with the care you need, when you need it.  Your next appointment:   6 month(s)  The format for your next appointment:   In Person  Provider:   Epifanio Lesches, MD     Signed, Little Ishikawa, MD  10/10/2020 3:58 PM    [facility]

## 2020-10-10 ENCOUNTER — Ambulatory Visit (INDEPENDENT_AMBULATORY_CARE_PROVIDER_SITE_OTHER): Payer: Self-pay | Admitting: Cardiology

## 2020-10-10 ENCOUNTER — Encounter: Payer: Self-pay | Admitting: Cardiology

## 2020-10-10 ENCOUNTER — Other Ambulatory Visit: Payer: Self-pay

## 2020-10-10 VITALS — BP 120/76 | HR 76 | Ht 68.0 in | Wt 163.0 lb

## 2020-10-10 DIAGNOSIS — R42 Dizziness and giddiness: Secondary | ICD-10-CM

## 2020-10-10 DIAGNOSIS — E785 Hyperlipidemia, unspecified: Secondary | ICD-10-CM

## 2020-10-10 DIAGNOSIS — I2510 Atherosclerotic heart disease of native coronary artery without angina pectoris: Secondary | ICD-10-CM

## 2020-10-10 DIAGNOSIS — Z720 Tobacco use: Secondary | ICD-10-CM

## 2020-10-10 NOTE — Patient Instructions (Signed)
Medication Instructions:  No Changes In Medications at this time.  *If you need a refill on your cardiac medications before your next appointment, please call your pharmacy*  Follow-Up: At [facility], you and your health needs are our priority.  As part of our continuing mission to provide you with exceptional heart care, we have created designated Provider Care Teams.  These Care Teams include your primary Cardiologist (physician) and Advanced Practice Providers (APPs -  Physician Assistants and Nurse Practitioners) who all work together to provide you with the care you need, when you need it.  Your next appointment:   6 month(s)  The format for your next appointment:   In Person  Provider:   Epifanio Lesches, MD

## 2020-10-11 ENCOUNTER — Other Ambulatory Visit: Payer: Self-pay

## 2020-10-12 ENCOUNTER — Telehealth: Payer: Self-pay | Admitting: Cardiology

## 2020-10-12 NOTE — Telephone Encounter (Signed)
Pt c/o medication issue:  1. Name of Medication:  Evolocumab (REPATHA SURECLICK) 140 MG/ML SOAJ  2. How are you currently taking this medication (dosage and times per day)?  Patient hasn't started taking Repatha  3. Are you having a reaction (difficulty breathing--STAT)?  No   4. What is your medication issue?   Patient states she was able to pick up the medication from her local pharmacy yesterday. She is requesting instructions on how to take it. Please advise.

## 2020-10-12 NOTE — Telephone Encounter (Signed)
Called pt to follow up. Pt wanted to let Dr. Bjorn Pippin know she had been able to get the Rapatha, but needed further direction on how to take it. Let patient know her message had been sent to pharmacy. Advised pt that there is step by step video instruction on administration of Rapatha on the website, pt reports she will look for these directions. Nurse also let pt know her message would again be sent to pharmacy to give her a call for instruction. Pt verbalized understanding, all questions/concerns addressed at this time.

## 2020-10-13 NOTE — Telephone Encounter (Signed)
Called patient and gave step by step instructions over the phone how to administer Repatha.  Patient voiced understanding.

## 2020-10-18 ENCOUNTER — Ambulatory Visit: Payer: Self-pay | Admitting: Nurse Practitioner

## 2020-10-18 ENCOUNTER — Telehealth (HOSPITAL_COMMUNITY): Payer: Self-pay

## 2020-10-18 NOTE — Telephone Encounter (Signed)
Called patient to see if she was interested in participating in the Cardiac Rehab Program. Patient stated yes virtual only Patient will come in for orientation on 11/28/2020@10 :30am.   Mailed package.

## 2020-10-24 ENCOUNTER — Encounter: Payer: Self-pay | Admitting: Nurse Practitioner

## 2020-10-27 ENCOUNTER — Ambulatory Visit: Payer: Self-pay | Admitting: Nurse Practitioner

## 2020-10-30 ENCOUNTER — Telehealth: Payer: Self-pay | Admitting: Cardiology

## 2020-10-30 ENCOUNTER — Ambulatory Visit: Payer: Self-pay

## 2020-10-30 ENCOUNTER — Telehealth: Payer: Self-pay | Admitting: Nurse Practitioner

## 2020-10-30 ENCOUNTER — Other Ambulatory Visit: Payer: Self-pay

## 2020-10-30 ENCOUNTER — Other Ambulatory Visit: Payer: Self-pay | Admitting: Nurse Practitioner

## 2020-10-30 DIAGNOSIS — R7611 Nonspecific reaction to tuberculin skin test without active tuberculosis: Secondary | ICD-10-CM

## 2020-10-30 NOTE — Telephone Encounter (Signed)
Pt. Reports she had a positive TB skin test last week for employment. She was told to call her PCP for treatment. She currently reports no symptoms. Spoke with Arna Medici in the practice and will forward triage note for review. Please advise pt.     Answer Assessment - Initial Assessment Questions 1. PLACE of EXPOSURE: "Where were you when you were exposed to Tuberculosis?" (e.g., city, state, country; school, work, hospital)     N/a 2. TYPE of EXPOSURE: "How were you exposed?" (e.g., close contact)     Unsure 3. DATE of EXPOSURE: "When did the exposure occur?" (e.g., days)     Unsure 4. PREGNANCY: "Is there any chance you are pregnant?" "When was your last menstrual period?"     No 5. HIGH RISK for COMPLICATIONS: "Do you have a weakened immune system?" (e.g., HIV positive, cancer chemotherapy)     No 6. TB SKIN TEST: "When was your last TB Skin Test?" (e.g., date, never had one, uncertain)  "What was the result?" (e.g., positive, negative;  width in millimeters; caller unsure)     10/25/20  Protocols used: Tuberculosis Exposure-A-AH

## 2020-10-30 NOTE — Telephone Encounter (Signed)
New message:     Patient calling stating that she just got tested and she now has TB. Patient calling to speak with some one concering this. Please advise.

## 2020-10-30 NOTE — Telephone Encounter (Signed)
Pt states she tested positive fot TB and wants to know her next steps.

## 2020-10-30 NOTE — Telephone Encounter (Signed)
Pt went to a [facility] and tested positive for TB/ pt would like advise on what to do/ please advise asap

## 2020-10-30 NOTE — Telephone Encounter (Signed)
Pt is calling to check on a response from [facility] regarding testing positive for TB. Advised that provider has received message and will respond. Awaiting advisement. Pt expressed understanding.

## 2020-10-30 NOTE — Telephone Encounter (Signed)
Needs quant gold test in lab.

## 2020-10-30 NOTE — Telephone Encounter (Signed)
Spoke with the patient and advised her to reach out to her PCP in regards to positive TB test.

## 2020-10-31 ENCOUNTER — Telehealth: Payer: Self-pay

## 2020-10-31 DIAGNOSIS — I214 Non-ST elevation (NSTEMI) myocardial infarction: Secondary | ICD-10-CM

## 2020-10-31 DIAGNOSIS — E785 Hyperlipidemia, unspecified: Secondary | ICD-10-CM

## 2020-10-31 MED ORDER — ATORVASTATIN CALCIUM 80 MG PO TABS: 80.0000 mg | ORAL_TABLET | Freq: Every day | ORAL | 3 refills | Status: DC

## 2020-10-31 NOTE — Telephone Encounter (Signed)
Pt states that she has an appointment already to be evaluated for TB

## 2020-10-31 NOTE — Telephone Encounter (Signed)
I called and spoke w/pt and stated that they need to complete fasting lipids post 4th dose and they voiced understanding. Pt stated that they needed a refill for lipitor 80 [facility] to [facility] refill sent

## 2020-10-31 NOTE — Telephone Encounter (Signed)
-----   Message from Cheree Ditto, [facility] sent at 10/31/2020  9:50 AM EDT ----- Regarding: RE: repatha snf approved Yes please order lipid panel in 2-3 months ----- Message ----- From: Eather Colas, CMA Sent: 10/18/2020  11:16 AM EDT To: Cheree Ditto, RPH Subject: repatha snf approved                           Dr. Delma Officer, This pt's snf approved through next year, however; we must order lipid labs post 2-3 months. Do I have your permission? Thanks, [employer]

## 2020-11-01 ENCOUNTER — Other Ambulatory Visit: Payer: Self-pay

## 2020-11-02 ENCOUNTER — Other Ambulatory Visit: Payer: Self-pay

## 2020-11-17 ENCOUNTER — Telehealth: Payer: Self-pay | Admitting: Cardiology

## 2020-11-17 NOTE — Telephone Encounter (Signed)
Patient called to say that she didn't get approve for medicaid and to see if the epipen was sent out. Please advise

## 2020-11-17 NOTE — Telephone Encounter (Signed)
Called and lmomed the pt that they were approved for amgen snf and that is how they are getting it shipped to their home

## 2020-11-17 NOTE — Telephone Encounter (Signed)
Returned call to patient. She needs a work note stating that she can work without restrictions and can work while taking the medications listed below. No medical records should be included other than the 3 meds listed -  albuterol (VENTOLIN HFA) 108 (90 Base) MCG/ACT inhaler nitroGLYCERIN (NITROSTAT) 0.4 MG SL tablet Evolocumab (REPATHA SURECLICK) 140 MG/ML SOAJ  She did not get approved for Medicaid/disability so now she has to go back to work  She was hoping for the note today, to start on Monday. Explained that MD was only in clinic this morning and I cannot guarantee a work letter today. He will be back on Monday.   Email: [email or web address] Attn: [employer]

## 2020-11-17 NOTE — Telephone Encounter (Signed)
   Pt c/o medication issue:  1. Name of Medication:  albuterol (VENTOLIN HFA) 108 (90 Base) MCG/ACT inhaler nitroGLYCERIN (NITROSTAT) 0.4 MG SL tablet Evolocumab (REPATHA SURECLICK) 140 MG/ML SOAJ  2. How are you currently taking this medication (dosage and times per day)?   3. Are you having a reaction (difficulty breathing--STAT)?   4. What is your medication issue? Pt requesting a work note, she said she got a job now and her employer wants a note from [place] that her heart care and asthma is under control

## 2020-11-20 NOTE — Telephone Encounter (Signed)
Yes we can give her a work letter that she is cleared without restrictions

## 2020-11-20 NOTE — Telephone Encounter (Signed)
Letter created and sent to Mychart.  Attempt to call patient, Susan Sexton.

## 2020-11-22 ENCOUNTER — Telehealth: Payer: Self-pay | Admitting: Licensed Clinical Social Worker

## 2020-11-22 NOTE — Progress Notes (Signed)
Heart and Vascular Care Navigation  11/22/2020  CERINA LEARY [birth year] [id number]  Reason for Referral:  Sent pt new application for [facility] for follow up visit for Heart and Vascular Care Coordination.                                                                                                   Assessment: LCSW checked in with financial counselor Christia Reading, note that pt had been denied for Medicaid and had filed an appeal. If she decides not to pursue appeal and would like to apply for [employer] and [employer] she may now be eligible.                                  HRT/VAS Care Coordination     Patients Home Cardiology Office [facility]   Outpatient Care Team Social Worker   Social Worker Name: Esmeralda Links [place], [phone]   Living arrangements for the past 2 months No permanent address   Lives with: Self   Patient Current Insurance Coverage Self-Pay   Patient Has Concern With Paying Medical Bills Yes   Patient Concerns With Medical Bills denied for [facility]   Medical Bill Referrals: had been mailed [place] financial assistance and orange card application   Does Patient Have Prescription Coverage? No   Patient Prescription Assistance Programs [place] [facility]; Patient Assistance Programs   [place] [facility] Medications mailed application 3/29   Patient Assistance Programs Medications pt will need [place] PAP   Home Assistive Devices/Equipment None       Social History:                                                                             SDOH Screenings   Alcohol Screen: Not on file  Depression (PHQ2-9): Not on file  Financial Resource Strain: High Risk   Difficulty of Paying Living Expenses: Very hard  Food Insecurity: Food Insecurity Present   Worried About Running Out of Food in the Last Year: Sometimes true   Ran Out of Food in the Last Year: Sometimes true  Housing: High Risk   Last Housing Risk  Score: 2  Physical Activity: Not on file  Social Connections: Not on file  Stress: Stress Concern Present   Feeling of Stress : To some extent  Tobacco Use: Medium Risk   Smoking Tobacco Use: Former   Smokeless Tobacco Use: Never  Transportation Needs: No Regulatory affairs officer (Medical): No   Lack of Transportation (Non-Medical): No    SDOH Interventions: Financial Resources:  NIKE for [employer] Counseling for [employer] Program   Follow-up plan:   I will f/u w/ pt to see  if she would like [place] to mail new applications to her to complete.

## 2020-11-23 ENCOUNTER — Telehealth (HOSPITAL_COMMUNITY): Payer: Self-pay

## 2020-11-24 ENCOUNTER — Telehealth (HOSPITAL_COMMUNITY): Payer: Self-pay

## 2020-11-24 NOTE — Telephone Encounter (Signed)
Attempt to call patient, left message to call back 

## 2020-11-28 ENCOUNTER — Telehealth: Payer: Self-pay | Admitting: Cardiology

## 2020-11-28 ENCOUNTER — Ambulatory Visit: Payer: Self-pay

## 2020-11-28 ENCOUNTER — Other Ambulatory Visit: Payer: Self-pay

## 2020-11-28 ENCOUNTER — Encounter (HOSPITAL_COMMUNITY): Payer: Self-pay

## 2020-11-28 ENCOUNTER — Encounter (HOSPITAL_COMMUNITY)
Admission: RE | Admit: 2020-11-28 | Discharge: 2020-11-28 | Disposition: A | Discharge status: Home / Self Care | Payer: Self-pay | Source: Ambulatory Visit | Attending: Cardiology | Admitting: Cardiology

## 2020-11-28 VITALS — BP 104/70 | HR 83 | Ht 66.5 in | Wt 162.5 lb

## 2020-11-28 DIAGNOSIS — Z955 Presence of coronary angioplasty implant and graft: Secondary | ICD-10-CM | POA: Insufficient documentation

## 2020-11-28 DIAGNOSIS — I214 Non-ST elevation (NSTEMI) myocardial infarction: Secondary | ICD-10-CM | POA: Insufficient documentation

## 2020-11-28 MED ORDER — REPATHA 140 MG/ML ~~LOC~~ SOSY
1.0000 mL | PREFILLED_SYRINGE | SUBCUTANEOUS | 9 refills | Status: DC
  Filled 2020-11-28: qty 1, 14d supply, fill #0

## 2020-11-28 NOTE — Progress Notes (Signed)
Cardiac Rehab Medication Review by a Nurse  Does the patient  feel that his/her medications are working for him/her?  yes  Has the patient been experiencing any side effects to the medications prescribed?  no  Does the patient measure his/her own blood pressure or blood glucose at home?  NO  Does the patient have any problems obtaining medications due to transportation or finances?   yes  Understanding of regimen: good Understanding of indications: good Potential of compliance: fair    Nurse comments: Susan Sexton is taking her medications as prescribed and says she has a fair understanding of what her medications are for. Susan Sexton just started a job in [place] and says her health insurance will not be activated for 60 days. I gave Susan Sexton about the MAP program with a contact phone number. Susan Sexton that her phone is not currently on as she has not had any income due to not working and that her application for medicaid was denied. Susan Sexton is getting her Susan Sexton currently by mail through the the pharmaceutical assistance program. Susan Sexton has not started her Repatha yet. I encouraged [facility] Se[facility] as she has refills on file. Susan Sexton does not have a BP monitor at home she says she will get one for her mother to use.    Susan Sexton [facility] RN 11/28/2020 11:43 AM

## 2020-11-28 NOTE — Progress Notes (Signed)
Cardiac Individual Treatment Plan  Patient Details  Name: Susan Sexton MRN: [id number] Date of Birth: Mar 10, 1986 Referring Provider:   Flowsheet Row CARDIAC REHAB PHASE II ORIENTATION from 11/28/2020 in [facility] CARDIAC REHAB  Referring Provider Little Ishikawa, MD       Initial Encounter Date:  Flowsheet Row CARDIAC REHAB PHASE II ORIENTATION from 11/28/2020 in [facility] CARDIAC REHAB  Date 11/28/20       Visit Diagnosis: 05/05/20 NSTEMI  05/05/20 DES RCA, 05/23/20 DES LAD, LM  Patient's Home Medications on Admission:  Current Outpatient Medications:    albuterol (VENTOLIN HFA) 108 (90 Base) MCG/ACT inhaler, Inhale 2 puffs into the lungs every 6 (six) hours as needed for wheezing or shortness of breath. NEEDS PASS, Disp: 18 g, Rfl: 1   aspirin 81 MG chewable tablet, Chew 1 tablet (81 mg total) by mouth daily., Disp: , Rfl:    atorvastatin (LIPITOR) 80 MG tablet, Take 1 tablet (80 mg total) by mouth daily., Disp: 90 tablet, Rfl: 3   Evolocumab (REPATHA SURECLICK) 140 MG/ML SOAJ, Inject 1 mL into the skin every 14 (fourteen) days., Disp: 2 mL, Rfl: 5   metoprolol succinate (TOPROL-XL) 25 MG 24 hr tablet, Take 0.5 tablets (12.5 mg total) by mouth daily., Disp: 30 tablet, Rfl: 11   Multiple Vitamins-Minerals (MULTI ADULT GUMMIES PO), Take 1 tablet by mouth daily., Disp: , Rfl:    nitroGLYCERIN (NITROSTAT) 0.4 MG SL tablet, Place 1 tablet (0.4 mg total) under the tongue every 5 (five) minutes x 3 doses as needed for chest pain., Disp: 25 tablet, Rfl: 2   ticagrelor (BRILINTA) 90 MG TABS tablet, Take 90 mg by mouth 2 (two) times daily., Disp: , Rfl:    Evolocumab (REPATHA) 140 MG/ML SOSY, Inject 1 mL as directed every 14 (fourteen) days., Disp: 1 mL, Rfl: 9  Past Medical History: Past Medical History:  Diagnosis Date   Asthma    Coronary artery disease    High cholesterol    Hyperlipidemia LDL goal <100 05/04/2020   NSTEMI (non-ST  elevated myocardial infarction) (HCC)     Tobacco Use: Social History   Tobacco Use  Smoking Status Some Days   Packs/day: 0.50   Types: Cigarettes   Last attempt to quit: 05/06/2020   Years since quitting: 0.5  Smokeless Tobacco Never  Tobacco Comments   Susan Sexton was given 1-800-quit-now. Susan Sexton says that she is smoking 1 cigarette a week    Labs: Recent Review Flowsheet Data     Labs for ITP Cardiac and Pulmonary Rehab Latest Ref Rng & Units 05/05/2020 07/10/2020   Cholestrol 100 - 199 mg/dL 071(Q) 197(J)   LDLCALC 0 - 99 mg/dL 883(G) 549(I)   HDL >26 mg/dL 49 65   Trlycerides 0 - 149 mg/dL 52 59       Capillary Blood Glucose: No results found for: GLUCAP   Exercise Target Goals: Exercise Program Goal: Individual exercise prescription set using results from initial 6 min walk test and THRR while considering  patient's activity barriers and safety.   Exercise Prescription Goal: Starting with aerobic activity 30 plus minutes a day, 3 days per week for initial exercise prescription. Provide home exercise prescription and guidelines that participant acknowledges understanding prior to discharge.  Activity Barriers & Risk Stratification:  Activity Barriers & Cardiac Risk Stratification - 11/28/20 1125       Activity Barriers & Cardiac Risk Stratification   Activity Barriers None    Cardiac Risk Stratification  High             6 Minute Walk:  6 Minute Walk     Row Name 11/28/20 1200         6 Minute Walk   Phase Initial     Distance 1534 feet     Walk Time 6 minutes     # of Rest Breaks 0     MPH 2.91     METS 5.17     RPE 11     Perceived Dyspnea  0     VO2 Peak 18.11     Symptoms No     Resting HR 83 bpm     Resting BP 104/70     Resting Oxygen Saturation  100 %     Exercise Oxygen Saturation  during 6 min walk 100 %     Max Ex. HR 93 bpm     Max Ex. BP 122/64     2 Minute Post BP 108/70              Oxygen Initial Assessment:   Oxygen  Re-Evaluation:   Oxygen Discharge (Final Oxygen Re-Evaluation):   Initial Exercise Prescription:  Initial Exercise Prescription - 11/28/20 1500       Date of Initial Exercise RX and Referring Provider   Date 11/28/20    Referring Provider Little Ishikawa, MD    Expected Discharge Date 02/26/21      Track   Minutes 30      Prescription Details   Frequency (times per week) 5-7      Intensity   THRR 40-80% of Max Heartrate 74-149    Ratings of Perceived Exertion 11-13    Perceived Dyspnea 0-4      Progression   Progression Continue to progress workloads to maintain intensity without signs/symptoms of physical distress.      Resistance Training   Training Prescription Yes    Weight --   Using household items   Reps 10-15             Perform Capillary Blood Glucose checks as needed.  Exercise Prescription Changes:   Exercise Comments:   Exercise Goals and Review:   Exercise Goals     Row Name 11/28/20 1040             Exercise Goals   Increase Physical Activity Yes       Intervention Provide advice, education, support and counseling about physical activity/exercise needs.;Develop an individualized exercise prescription for aerobic and resistive training based on initial evaluation findings, risk stratification, comorbidities and participant's personal goals.       Expected Outcomes Short Term: Attend rehab on a regular basis to increase amount of physical activity.;Long Term: Exercising regularly at least 3-5 days a week.;Long Term: Add in home exercise to make exercise part of routine and to increase amount of physical activity.       Increase Strength and Stamina Yes       Intervention Provide advice, education, support and counseling about physical activity/exercise needs.;Develop an individualized exercise prescription for aerobic and resistive training based on initial evaluation findings, risk stratification, comorbidities and participant's  personal goals.       Expected Outcomes Short Term: Increase workloads from initial exercise prescription for resistance, speed, and METs.;Short Term: Perform resistance training exercises routinely during rehab and add in resistance training at home;Long Term: Improve cardiorespiratory fitness, muscular endurance and strength as measured by increased METs and functional capacity ( )  Able to understand and use rate of perceived exertion (RPE) scale Yes       Intervention Provide education and explanation on how to use RPE scale       Expected Outcomes Short Term: Able to use RPE daily in rehab to express subjective intensity level;Long Term:  Able to use RPE to guide intensity level when exercising independently       Knowledge and understanding of Target Heart Rate Range (THRR) Yes       Intervention Provide education and explanation of THRR including how the numbers were predicted and where they are located for reference       Expected Outcomes Short Term: Able to state/look up THRR;Long Term: Able to use THRR to govern intensity when exercising independently;Short Term: Able to use daily as guideline for intensity in rehab       Able to check pulse independently Yes       Intervention Provide education and demonstration on how to check pulse in carotid and radial arteries.;Review the importance of being able to check your own pulse for safety during independent exercise       Expected Outcomes Short Term: Able to explain why pulse checking is important during independent exercise;Long Term: Able to check pulse independently and accurately       Understanding of Exercise Prescription Yes       Intervention Provide education, explanation, and written materials on patient's individual exercise prescription       Expected Outcomes Short Term: Able to explain program exercise prescription;Long Term: Able to explain home exercise prescription to exercise independently                 Exercise Goals Re-Evaluation :    Discharge Exercise Prescription (Final Exercise Prescription Changes):   Nutrition:  Target Goals: Understanding of nutrition guidelines, daily intake of sodium 1500mg , cholesterol 200mg , calories 30% from fat and 7% or less from saturated fats, daily to have 5 or more servings of fruits and vegetables.  Biometrics:  Pre Biometrics - 11/28/20 1038       Pre Biometrics   Waist Circumference 36.25 inches    Hip Circumference 40 inches    Waist to Hip Ratio 0.91 %    Triceps Skinfold 28 mm    % Body Fat 35.7 %    Grip Strength 31 kg    Flexibility 14 in    Single Leg Stand 30 seconds              Nutrition Therapy Plan and Nutrition Goals:   Nutrition Assessments:  MEDIFICTS Score Key: ?70 Need to make dietary changes  40-70 Heart Healthy Diet ? 40 Therapeutic Level Cholesterol Diet   Picture Your Plate Scores: <29 Unhealthy dietary pattern with much room for improvement. 41-50 Dietary pattern unlikely to meet recommendations for good health and room for improvement. 51-60 More healthful dietary pattern, with some room for improvement.  >60 Healthy dietary pattern, although there may be some specific behaviors that could be improved.    Nutrition Goals Re-Evaluation:   Nutrition Goals Discharge (Final Nutrition Goals Re-Evaluation):   Psychosocial: Target Goals: Acknowledge presence or absence of significant depression and/or stress, maximize coping skills, provide positive support system. Participant is able to verbalize types and ability to use techniques and skills needed for reducing stress and depression.  Initial Review & Psychosocial Screening:  Initial Psych Review & Screening - 11/28/20 1632       Initial Review   Current issues with  Current Depression;Current Stress Concerns    Source of Stress Concerns Chronic Illness;Occupation;Poor Coping Skills;Unable to participate in former interests or  hobbies;Financial    Comments Susan Sexton admits to being currently depressed see epic note      Barriers   Psychosocial barriers to participate in program The patient should benefit from training in stress management and relaxation.;Psychosocial barriers identified (see note)      Screening Interventions   Interventions Encouraged to exercise;To provide support and resources with identified psychosocial needs    Expected Outcomes Long Term Goal: Stressors or current issues are controlled or eliminated.;Long Term goal: The participant improves quality of Life and PHQ9 Scores as seen by post scores and/or verbalization of changes             Quality of Life Scores:  Quality of Life - 11/28/20 1540       Quality of Life   Select Quality of Life      Quality of Life Scores   Health/Function Pre 14.77 %    Socioeconomic Pre 10.5 %    Psych/Spiritual Pre 24 %    Family Pre 6 %    GLOBAL Pre 14.39 %            Scores of 19 and below usually indicate a poorer quality of life in these areas.  A difference of  2-3 points is a clinically meaningful difference.  A difference of 2-3 points in the total score of the Quality of Life Index has been associated with significant improvement in overall quality of life, self-image, physical symptoms, and general health in studies assessing change in quality of life.  PHQ-9: Recent Review Flowsheet Data     Depression screen [facility] 2/9 11/28/2020   Decreased Interest 2   Down, Depressed, Hopeless 2   PHQ - 2 Score 4   Altered sleeping 0   Tired, decreased energy 2   Change in appetite 0   Feeling bad or failure about yourself  2   Trouble concentrating 1   Moving slowly or fidgety/restless 1   Suicidal thoughts 0   PHQ-9 Score 10   Difficult doing work/chores Not difficult at all      Interpretation of Total Score  Total Score Depression Severity:  1-4 = Minimal depression, 5-9 = Mild depression, 10-14 = Moderate depression, 15-19 =  Moderately severe depression, 20-27 = Severe depression   Psychosocial Evaluation and Intervention:   Psychosocial Re-Evaluation:   Psychosocial Discharge (Final Psychosocial Re-Evaluation):   Vocational Rehabilitation: Provide vocational rehab assistance to qualifying candidates.   Vocational Rehab Evaluation & Intervention:  Vocational Rehab - 11/28/20 1634       Initial Vocational Rehab Evaluation & Intervention   Assessment shows need for Vocational Rehabilitation No   Susan Sexton just started a full time job and does not need vocational rehab at this time            Education: Education Goals: Education classes will be provided on a weekly basis, covering required topics. Participant will state understanding/return demonstration of topics presented.  Learning Barriers/Preferences:  Learning Barriers/Preferences - 11/28/20 1633       Learning Barriers/Preferences   Learning Barriers Sight   wears glasses   Learning Preferences Skilled Demonstration;Individual Instruction             Education Topics: Hypertension, Hypertension Reduction -Define heart disease and high blood pressure. Discus how high blood pressure affects the body and ways to reduce high blood pressure.   Exercise and  Your Heart -Discuss why it is important to exercise, the FITT principles of exercise, normal and abnormal responses to exercise, and how to exercise safely.   Angina -Discuss definition of angina, causes of angina, treatment of angina, and how to decrease risk of having angina.   Cardiac Medications -Review what the following cardiac medications are used for, how they affect the body, and side effects that may occur when taking the medications.  Medications include Aspirin, Beta blockers, calcium channel blockers, ACE Inhibitors, angiotensin receptor blockers, diuretics, digoxin, and antihyperlipidemics.   Congestive Heart Failure -Discuss the definition of CHF, how to live  with CHF, the signs and symptoms of CHF, and how keep track of weight and sodium intake.   Heart Disease and Intimacy -Discus the effect sexual activity has on the heart, how changes occur during intimacy as we age, and safety during sexual activity.   Smoking Cessation / COPD -Discuss different methods to quit smoking, the health benefits of quitting smoking, and the definition of COPD.   Nutrition I: Fats -Discuss the types of cholesterol, what cholesterol does to the heart, and how cholesterol levels can be controlled.   Nutrition II: Labels -Discuss the different components of food labels and how to read food label   Heart Parts/Heart Disease and PAD -Discuss the anatomy of the heart, the pathway of blood circulation through the heart, and these are affected by heart disease.   Stress I: Signs and Symptoms -Discuss the causes of stress, how stress may lead to anxiety and depression, and ways to limit stress.   Stress II: Relaxation -Discuss different types of relaxation techniques to limit stress.   Warning Signs of Stroke / TIA -Discuss definition of a stroke, what the signs and symptoms are of a stroke, and how to identify when someone is having stroke.   Knowledge Questionnaire Score:  Knowledge Questionnaire Score - 11/28/20 1541       Knowledge Questionnaire Score   Pre Score 25/28             Core Components/Risk Factors/Patient Goals at Admission:  Personal Goals and Risk Factors at Admission - 11/28/20 1125       Core Components/Risk Factors/Patient Goals on Admission    Weight Management Yes    Intervention Weight Management: Develop a combined nutrition and exercise program designed to reach desired caloric intake, while maintaining appropriate intake of nutrient and fiber, sodium and fats, and appropriate energy expenditure required for the weight goal.;Weight Management: Provide education and appropriate resources to help participant work on and  attain dietary goals.    Expected Outcomes Long Term: Adherence to nutrition and physical activity/exercise program aimed toward attainment of established weight goal;Weight Maintenance: Understanding of the daily nutrition guidelines, which includes 25-35% calories from fat, 7% or less cal from saturated fats, less than 200mg  cholesterol, less than 1.5gm of sodium, & 5 or more servings of fruits and vegetables daily;Understanding of distribution of calorie intake throughout the day with the consumption of 4-5 meals/snacks    Tobacco Cessation Yes    Intervention Assist the participant in steps to quit. Provide individualized education and counseling about committing to Tobacco Cessation, relapse prevention, and pharmacological support that can be provided by physician.; , assist with locating and accessing local/national Quit Smoking programs, and support quit date choice.    Expected Outcomes Short Term: Will quit all tobacco product use, adhering to prevention of relapse plan.;Long Term: Complete abstinence from all tobacco products for at least 12 months  from quit date.;Short Term: Will demonstrate readiness to quit, by selecting a quit date.    Hypertension Yes    Intervention Provide education on lifestyle modifcations including regular physical activity/exercise, weight management, moderate sodium restriction and increased consumption of fresh fruit, vegetables, and low fat dairy, alcohol moderation, and smoking cessation.;Monitor prescription use compliance.    Expected Outcomes Short Term: Continued assessment and intervention until BP is < 140/92mm HG in hypertensive participants. < 130/72mm HG in hypertensive participants with diabetes, heart failure or chronic kidney disease.;Long Term: Maintenance of blood pressure at goal levels.    Lipids Yes    Intervention Provide education and support for participant on nutrition & aerobic/resistive exercise along with  prescribed medications to achieve LDL 70mg , HDL >40mg .    Expected Outcomes Short Term: Participant states understanding of desired cholesterol values and is compliant with medications prescribed. Participant is following exercise prescription and nutrition guidelines.;Long Term: Cholesterol controlled with medications as prescribed, with individualized exercise RX and with personalized nutrition plan. Value goals: LDL < , HDL > 40 mg.    Stress Yes    Intervention Offer individual and/or small group education and counseling on adjustment to heart disease, stress management and health-related lifestyle change. Teach and support self-help strategies.;Refer participants experiencing significant psychosocial distress to appropriate mental health specialists for further evaluation and treatment. When possible, include family members and significant others in education/counseling sessions.    Expected Outcomes Short Term: Participant demonstrates changes in health-related behavior, relaxation and other stress management skills, ability to obtain effective social support, and compliance with psychotropic medications if prescribed.;Long Term: Emotional wellbeing is indicated by absence of clinically significant psychosocial distress or social isolation.             Core Components/Risk Factors/Patient Goals Review:    Core Components/Risk Factors/Patient Goals at Discharge (Final Review):    ITP Comments:  ITP Comments     Row Name 11/28/20 1038           ITP Comments Medical Director- Dr. Armanda Magic, MD                Comments: Glee Arvin attended orientation on 11/28/2020 to review rules and guidelines for program.  Completed 6 minute walk test, Intitial ITP, and exercise prescription.  VSS. Telemetry-Sinus Rhythm.  Asymptomatic. Safety measures and social distancing in place per CDC guidelines. Farrie was able to download the virtual cardiac rehab chanl health app without difficulty  or issues. Patient able to navigate on the App shown resources. Patient has the find my heart rate app on her smart phone. Will use during exercise/walking.Gladstone Lighter, RN,BSN 11/28/2020 4:39 PM

## 2020-11-28 NOTE — Telephone Encounter (Signed)
Patient called in to say that [facility] stated that they can see her name but there is not application attach. Patient is asking that our office send the application over again so that she can get the medication she needs. Please advise  [facility] fax:   [phone]

## 2020-11-28 NOTE — Progress Notes (Signed)
         Confirm Consent - In the setting of the current Covid19 crisis, you are scheduled for a phone visit with your Cardiac or Pulmonary team member.  Just as we do with many in-gym visits, in order for you to participate in this visit, we must obtain consent.  If you'd like, I can send this to your mychart (if signed up) or email for you to review.  Otherwise, I can obtain your verbal consent now.  By agreeing to a telephone visit, we'd like you to understand that the technology does not allow for your Cardiac or Pulmonary Rehab team member to perform a physical assessment, and thus may limit their ability to fully assess your ability to perform exercise programs. If your provider identifies any concerns that need to be evaluated in person, we will make arrangements to do so.  Finally, though the technology is pretty good, we cannot assure that it will always work on either your or our end and we cannot ensure that we have a secure connection.  Cardiac and Pulmonary Rehab Telehealth visits and "At Home" cardiac and pulmonary rehab are provided at no cost to you.        Are you willing to proceed?"        STAFF: Did the patient verbally acknowledge consent to telehealth visit? Document YES/NO here: Yes     Gladstone Lighter RN  Cardiac and Pulmonary Rehab Staff        Date 11/28/20    @ Time 11;41        Pt is interested in participating in Virtual Cardiac and Pulmonary Rehab. Pt advised that Virtual Cardiac and Pulmonary Rehab is provided at no cost to the patient.  Checklist:  Pt has smart device  ie smartphone and/or ipad for downloading an app  Yes Reliable internet/wifi service    Yes Understands how to use their smartphone and navigate within an app.  Yes   Pt verbalized understanding and is in agreement.

## 2020-11-28 NOTE — Telephone Encounter (Signed)
Patient calling the office for samples of medication:   1.  What medication and dosage are you requesting samples for? metoprolol succinate (TOPROL-XL) 25 MG 24 hr tablet  2.  Are you currently out of this medication? Yes   Patient is at Cardiac Rehab. Byrd Hesselbach called on behalf of the patient. She started a new job and does not have insurance to pay for her medication right now. Byrd Hesselbach said it would be best to call her at Cardiac Rehab because the patient's phone is currently out of service.   The patient should be at Cardiac Rehab for the next ~45 min or so

## 2020-11-28 NOTE — Telephone Encounter (Signed)
Called and spoke w/pt and stated that we need to have her repeat amgen snf forms and mil back to the clinic she voiced understanding

## 2020-11-28 NOTE — Progress Notes (Signed)
Susan Sexton admits to being depressed since her MI and Stenting in February and March. PHQ2-9=10. Susan Sexton was noted to be tearful during cardiac rehab orientation for virtual cardiac rehab. Susan Sexton denies being suicidal but is interested in talking to Susan Sexton about setting up an appointment to discuss depression and counseling. [facility] was called and notified. Susan Sexton from triage called back and spoke to the patient over the phone. Susan Sexton's office is to call the patient to set up an appointment for evaluation. Susan Sexton just started a job at [employer] and has an alternative phone number as her phone number is not in service due to finances. Emotional support given to the patient.Gladstone Lighter, RN,BSN 11/28/2020 4:27 PM

## 2020-11-28 NOTE — Telephone Encounter (Signed)
Spoke with Mia letting her know that we do not have samples of Metoprolol. She will let the patient know.

## 2020-11-28 NOTE — Telephone Encounter (Addendum)
Pt. Reports she has been depressed since her "heart attack in February." Feels sad. Not sleeping or eating well. Feels tired.Denies any thoughts of self-harm. Offered [facility], declines. Requesting appointment as soon as possible. Practice currently closed for lunch. Please advise pt.   Reason for Disposition  [1] Depression AND [2] worsening (e.g.,sleeping poorly, less able to do activities of daily living)  Answer Assessment - Initial Assessment Questions 1. CONCERN: "What happened that made you call today?"     Depression 2. DEPRESSION SYMPTOM SCREENING: "How are you feeling overall?" (e.g., decreased energy, increased sleeping or difficulty sleeping, difficulty concentrating, feelings of sadness, guilt, hopelessness, or worthlessness)     Decreased energy, feels sad 3. RISK OF HARM - SUICIDAL IDEATION:  "Do you ever have thoughts of hurting or killing yourself?"  (e.g., yes, no, no but preoccupation with thoughts about death)   - INTENT:  "Do you have thoughts of hurting or killing yourself right NOW?" (e.g., yes, no, N/A)   - PLAN: "Do you have a specific plan for how you would do this?" (e.g., gun, knife, overdose, no plan, N/A)     No 4. RISK OF HARM - HOMICIDAL IDEATION:  "Do you ever have thoughts of hurting or killing someone else?"  (e.g., yes, no, no but preoccupation with thoughts about death)   - INTENT:  "Do you have thoughts of hurting or killing someone right NOW?" (e.g., yes, no, N/A)   - PLAN: "Do you have a specific plan for how you would do this?" (e.g., gun, knife, no plan, N/A)      No 5. FUNCTIONAL IMPAIRMENT: "How have things been going for you overall? Have you had more difficulty than usual doing your normal daily activities?"  (e.g., better, same, worse; self-care, school, work, interactions)     Work 6. SUPPORT: "Who is with you now?" "Who do you live with?" "Do you have family or friends who you can talk to?"      Family 7. THERAPIST: "Do you have a  counselor or therapist? Name?"     No 8. STRESSORS: "Has there been any new stress or recent changes in your life?"     Heart attack 9. ALCOHOL USE OR SUBSTANCE USE (DRUG USE): "Do you drink alcohol or use any illegal drugs?"     No 10. OTHER: "Do you have any other physical symptoms right now?" (e.g., fever)       Poor sleeping 11. PREGNANCY: "Is there any chance you are pregnant?" "When was your last menstrual period?"       No  Protocols used: Depression-A-AH

## 2020-12-05 ENCOUNTER — Telehealth (HOSPITAL_COMMUNITY): Payer: Self-pay | Admitting: *Deleted

## 2020-12-05 NOTE — Telephone Encounter (Signed)
Cardiac Rehab: Virtual Visit  Patient participates in the virtual cardiac rehab program via the [facility] app. Called patient to check progress with home exercise program. No answer, message left on patient's voicemail.   [name], MS, ACSM CEP 12/05/2020 1323

## 2020-12-26 ENCOUNTER — Telehealth (HOSPITAL_COMMUNITY): Payer: Self-pay | Admitting: *Deleted

## 2020-12-26 ENCOUNTER — Encounter (HOSPITAL_COMMUNITY)
Admission: RE | Admit: 2020-12-26 | Discharge: 2020-12-26 | Disposition: A | Discharge status: Home / Self Care | Payer: Self-pay | Source: Ambulatory Visit | Attending: Cardiology | Admitting: Cardiology

## 2020-12-26 NOTE — Progress Notes (Signed)
Cardiac Rehab: Virtual Visit  Patient participates in the virtual cardiac rehab program via the [facility] app. Patient has been unable to log exercise in the app on the Android phone it was initially downloaded on. Will resend link to patient's iPhone and see if she is successfully able to download and use program on that phone. Patient states that she has been walking daily, and has been able to walk up to 30 minutes without issues. Patient states she still gets tired doing her daily activities. I encouraged patient that exercise may help build her stamina and reduce fatigue with activities, but if this continues to be an issue for her to contact her doctor. Patient verbalizes understanding.  Will follow up with patient in the next week.  [name], MS, ACSM CEP  12/26/2020 1040

## 2021-01-03 ENCOUNTER — Telehealth (HOSPITAL_COMMUNITY): Payer: Self-pay | Admitting: *Deleted

## 2021-01-04 ENCOUNTER — Encounter: Payer: Self-pay | Admitting: Cardiology

## 2021-01-04 ENCOUNTER — Telehealth: Payer: Self-pay | Admitting: Cardiology

## 2021-01-04 NOTE — Telephone Encounter (Signed)
Patient states it says on her chart "tobacco abuse" since February, but she says she quit smoking as soon as she had the heart attack. She would like this updated in her chart. She says for now to call back: [phone], until her normal number is back on.

## 2021-01-04 NOTE — Telephone Encounter (Signed)
Spoke with pt regarding chart saying "tobacco abuse". Per pt she is concerned that this diagnose will hinder her ability to get disability or medicaid if they review her chart. Explained to pt that this diagnosis was pertinent at the time of her heart attack but that we could mark that she quit smoking after having stents placed. Pt verbalizes understanding.

## 2021-01-17 ENCOUNTER — Encounter (HOSPITAL_COMMUNITY)
Admission: RE | Admit: 2021-01-17 | Discharge: 2021-01-17 | Disposition: A | Discharge status: Home / Self Care | Payer: Self-pay | Source: Ambulatory Visit | Attending: Cardiology | Admitting: Cardiology

## 2021-01-17 NOTE — Progress Notes (Signed)
Cardiac Rehab: Virtual Visit  Patient participates in the virtual cardiac rehab program via the [facility] app. Contacted patient to check progress with exercise. Patient states that she is walking but not logging activity in the app due to not having means to check all the vital signs the app is asking for. I asked patient if she preferred follow-up calls instead of using the app to log her activity, and she prefers calls.  Although patient is walking about an hour daily, she states that she still gets short of breath with activity and doesn't have the energy to do the activities she was doing prior to her heart attack. I suggested patient contact her cardiologist's office regarding her continued lack of energy, and patient is amenable to this.  Will follow-up with patient next week.  [name], MS, ACSM CEP 01/17/2021 1553

## 2021-01-24 ENCOUNTER — Encounter (HOSPITAL_COMMUNITY)
Admission: RE | Admit: 2021-01-24 | Discharge: 2021-01-24 | Disposition: A | Discharge status: Home / Self Care | Payer: Self-pay | Source: Ambulatory Visit | Attending: Cardiology | Admitting: Cardiology

## 2021-01-24 NOTE — Progress Notes (Signed)
Cardiac Rehab: Virtual Visit  Susan Sexton participates in the virtual cardiac rehab program via the [facility] app. Participant is doing well since our last chat. Participant states that she has not had any shortness of breath with exertion and her energy level is getting better. She walked outdoors at least 30 minutes every day this past week except on the days it rained.  She also walked about 30 minutes indoors when unable to go outside and walk. Participant is stretching and using household items like cans or bottles for her hand weight exercises.  Participant is doing a little more activity every day and feels she's making progress. Participant is starting a new job that requires a lot of walking, and hopefully the exercise will help her perform her job duties.   Participant is following a heart healthy diet. Participant states she eats fried foods no more than once or twice a week and is eating more vegetables in her diet. Participant has found the nutrition handouts she's received beneficial.  Susan Sexton mentioned to me that she doesn't have an updated prescription for her Repatha. I advised her to contact her cardiologist's office, and she plans to do this. Will follow-up with Susan Sexton in two weeks.  [name], MS, ACSM CEP 01/24/21 1645

## 2021-02-09 ENCOUNTER — Other Ambulatory Visit: Payer: Self-pay

## 2021-02-09 ENCOUNTER — Encounter (HOSPITAL_COMMUNITY)
Admission: RE | Admit: 2021-02-09 | Discharge: 2021-02-09 | Disposition: A | Discharge status: Home / Self Care | Payer: Self-pay | Source: Ambulatory Visit | Attending: Cardiology | Admitting: Cardiology

## 2021-02-09 NOTE — Progress Notes (Signed)
Cardiac Rehab: Virtual Visit  Susan Sexton participates in the virtual cardiac rehab program via the [facility] app. Contacted participant to check progress with exercise and Susan Sexton is doing well. 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 She states her performance at work is high. She is tired but doing well without any chest pain. Participant is trying not to overdo it and knows her limitations. She has noticed that her feet swell sometimes after work, and we discussed trying compression socks to help relieve this. She is able to get a lot of walking in at work and does "flex and stretch" exercises prior to her shift. Susan Sexton has 2 flights of stairs outside her apartment and stairs inside her residence that she has some difficulty with likely related to leg fatigue from standing all day. We discussed trying to do some squats 1-2 days/week to help build leg strength, and she is amenable to this. Reviewed temperature guidelines and walking indoors when it less than 40 degrees outside. Will follow-up with participant next week.  [name], MS, ACSM CEP 02/09/2021 [phone]

## 2021-02-12 ENCOUNTER — Other Ambulatory Visit: Payer: Self-pay

## 2021-02-13 ENCOUNTER — Other Ambulatory Visit: Payer: Self-pay

## 2021-02-23 ENCOUNTER — Telehealth (HOSPITAL_COMMUNITY): Payer: Self-pay | Admitting: *Deleted

## 2021-02-23 NOTE — Telephone Encounter (Signed)
Patient participates in the virtual cardiac rehab program. Called to check progress with home exercise. No answer, message left on patient's voicemail.

## 2021-03-07 ENCOUNTER — Other Ambulatory Visit: Payer: Self-pay

## 2021-03-07 ENCOUNTER — Encounter (HOSPITAL_COMMUNITY)
Admission: RE | Admit: 2021-03-07 | Discharge: 2021-03-07 | Disposition: A | Discharge status: Home / Self Care | Payer: Self-pay | Source: Ambulatory Visit | Attending: Cardiology | Admitting: Cardiology

## 2021-03-07 NOTE — Progress Notes (Signed)
Cardiac Rehab: Virtual Visit  Patient participates in the virtual cardiac rehab program. Called to check progress with exercise and to schedule appointment for exit walk test and measurements. Susan Sexton feels things are going OK, no change in exercise routine from last check-in.  Patient scheduled for post walk test and measurements on Tuesday, 04/10/2021 at 3:00 PM.  [name], MS, ACSM CEP 03/07/2021 1625

## 2021-03-15 ENCOUNTER — Ambulatory Visit: Payer: Self-pay | Attending: Physician Assistant | Admitting: Physician Assistant

## 2021-03-15 ENCOUNTER — Encounter: Payer: Self-pay | Admitting: Physician Assistant

## 2021-03-15 ENCOUNTER — Other Ambulatory Visit: Payer: Self-pay

## 2021-03-15 VITALS — BP 121/80 | HR 79 | Resp 16 | Wt 162.8 lb

## 2021-03-15 DIAGNOSIS — I214 Non-ST elevation (NSTEMI) myocardial infarction: Secondary | ICD-10-CM

## 2021-03-15 DIAGNOSIS — E785 Hyperlipidemia, unspecified: Secondary | ICD-10-CM

## 2021-03-15 NOTE — Progress Notes (Signed)
Patient ID: Susan Sexton, female   DOB: 07/25/1985, 36 y.o.   MRN: [id number]   Susan Sexton, is a 36 y.o. female  GMW:[id number]  YQI:[id number]  DOB - 05-02-1985  No chief complaint on file.      Subjective:   Susan Sexton is a 36 y.o. female here today for a follow up visit bc not seen here in a while.  S/p nstemi and 4 stents place in her heart in the spring.  She is followed by Dr Darryl Nestle.  He started her on cholesterol meds but she never went back for labs. She sees him again in 2 months.  Strong FH early cardiaC disease  Patient has No headache, No chest pain, No abdominal pain - No Nausea, No new weakness tingling or numbness, No Cough - SOB.  No problems updated.  ALLERGIES: No Known Allergies  PAST MEDICAL HISTORY: Past Medical History:  Diagnosis Date   Asthma    Coronary artery disease    High cholesterol    Hyperlipidemia LDL goal <100 05/04/2020   NSTEMI (non-ST elevated myocardial infarction) [facility])     MEDICATIONS AT HOME: Prior to Admission medications   Medication Sig Start Date End Date Taking? Authorizing Provider  albuterol (VENTOLIN HFA) 108 (90 Base) MCG/ACT inhaler Inhale 2 puffs into the lungs every 6 (six) hours as needed for wheezing or shortness of breath. NEEDS PASS 07/24/20  Yes Claiborne Rigg, NP  aspirin 81 MG chewable tablet Chew 1 tablet (81 mg total) by mouth daily. 05/06/20  Yes Marjie Skiff E, PA-C  atorvastatin (LIPITOR) 80 MG tablet Take 1 tablet (80 mg total) by mouth daily. 09/28/20  Yes Little Ishikawa, MD  Evolocumab (REPATHA SURECLICK) 140 MG/ML SOAJ Inject 1 mL into the skin every 14 (fourteen) days. 09/28/20  Yes Little Ishikawa, MD  Evolocumab (REPATHA) 140 MG/ML SOSY Inject 1 mL as directed every 14 (fourteen) days. 09/28/20  Yes Little Ishikawa, MD  metoprolol succinate (TOPROL-XL) 25 MG 24 hr tablet Take 0.5 tablets (12.5 mg total) by mouth daily. 05/24/20  Yes Corky Crafts, MD  Multiple  Vitamins-Minerals (MULTI ADULT GUMMIES PO) Take 1 tablet by mouth daily.   Yes [provider]  nitroGLYCERIN (NITROSTAT) 0.4 MG SL tablet Place 1 tablet (0.4 mg total) under the tongue every 5 (five) minutes x 3 doses as needed for chest pain. 05/06/20  Yes Corrin Parker, PA-C  ticagrelor (BRILINTA) 90 MG TABS tablet Take 90 mg by mouth 2 (two) times daily.   Yes [provider]    ROS: Neg HEENT Neg resp Neg cardiac Neg GI Neg GU Neg MS Neg psych Neg neuro  Objective:   Vitals:   03/15/21 1422  BP: 121/80  Pulse: 79  Resp: 16  SpO2: 93%  Weight: 162 lb 12.8 oz (73.8 kg)   Exam General appearance : Awake, alert, not in any distress. Speech Clear. Not toxic looking HEENT: Atraumatic and Normocephalic Neck: Supple, no JVD. No cervical lymphadenopathy.  Chest: Good air entry bilaterally, CTAB.  No rales/rhonchi/wheezing CVS: S1 S2 regular, no murmurs.  Extremities: B/L Lower Ext shows no edema, both legs are warm to touch Neurology: Awake alert, and oriented X 3, CN II-XII intact, Non focal Skin: No Rash  Data Review No results found for: HGBA1C  Assessment & Plan   1. Hyperlipidemia LDL goal <70 - Comprehensive metabolic panel - Lipid panel - CBC with Differential/Platelet  2. NSTEMI (non-ST elevated myocardial infarction) (HCC) Continue current  regimen and keep f/up with cardiology as scheduled.  She did not need any RF today - Comprehensive metabolic panel - CBC with Differential/Platelet    Patient have been counseled extensively about nutrition and exercise. Other issues discussed during this visit include: low cholesterol diet, weight control and daily exercise, foot care, annual eye examinations at Ophthalmology, importance of adherence with medications and regular follow-up. We also discussed long term complications of uncontrolled diabetes and hypertension.   Return in about 4 months (around 07/13/2021) for with PCP for chronic  conditions.  The patient was given clear instructions to go to ER or return to medical center if symptoms don't improve, worsen or new problems develop. The patient verbalized understanding. The patient was told to call to get lab results if they haven't heard anything in the next week.      Georgian Co, PA-C [facility] [place], [place] [phone]   03/15/2021, 2:40 PM

## 2021-03-16 ENCOUNTER — Telehealth: Payer: Self-pay | Admitting: Licensed Clinical Social Worker

## 2021-03-16 LAB — CBC WITH DIFFERENTIAL/PLATELET
Basophils Absolute: 0 10*3/uL (ref 0.0–0.2)
Basos: 0 %
EOS (ABSOLUTE): 0.1 10*3/uL (ref 0.0–0.4)
Eos: 1 %
Hematocrit: 39 % (ref 34.0–46.6)
Hemoglobin: 13 g/dL (ref 11.1–15.9)
Immature Grans (Abs): 0 10*3/uL (ref 0.0–0.1)
Immature Granulocytes: 0 %
Lymphocytes Absolute: 3.3 10*3/uL — ABNORMAL HIGH (ref 0.7–3.1)
Lymphs: 37 %
MCH: 30.9 pg (ref 26.6–33.0)
MCHC: 33.3 g/dL (ref 31.5–35.7)
MCV: 93 fL (ref 79–97)
Monocytes Absolute: 0.8 10*3/uL (ref 0.1–0.9)
Monocytes: 8 %
Neutrophils Absolute: 4.9 10*3/uL (ref 1.4–7.0)
Neutrophils: 54 %
Platelets: 241 10*3/uL (ref 150–450)
RBC: 4.21 x10E6/uL (ref 3.77–5.28)
RDW: 12.2 % (ref 11.7–15.4)
WBC: 9.1 10*3/uL (ref 3.4–10.8)

## 2021-03-16 LAB — COMPREHENSIVE METABOLIC PANEL
ALT: 13 IU/L (ref 0–32)
AST: 17 IU/L (ref 0–40)
Albumin/Globulin Ratio: 2 (ref 1.2–2.2)
Albumin: 5.1 g/dL — ABNORMAL HIGH (ref 3.8–4.8)
Alkaline Phosphatase: 113 IU/L (ref 44–121)
BUN/Creatinine Ratio: 17 (ref 9–23)
BUN: 14 mg/dL (ref 6–20)
Bilirubin Total: 0.3 mg/dL (ref 0.0–1.2)
CO2: 25 mmol/L (ref 20–29)
Calcium: 10.1 mg/dL (ref 8.7–10.2)
Chloride: 106 mmol/L (ref 96–106)
Creatinine, Ser: 0.84 mg/dL (ref 0.57–1.00)
Globulin, Total: 2.6 g/dL (ref 1.5–4.5)
Glucose: 84 mg/dL (ref 70–99)
Potassium: 3.8 mmol/L (ref 3.5–5.2)
Sodium: 143 mmol/L (ref 134–144)
Total Protein: 7.7 g/dL (ref 6.0–8.5)
eGFR: 93 mL/min/{1.73_m2} (ref >59)

## 2021-03-16 LAB — LIPID PANEL
Chol/HDL Ratio: 3 ratio (ref 0.0–4.4)
Cholesterol, Total: 178 mg/dL (ref 100–199)
HDL: 59 mg/dL (ref >39)
LDL Chol Calc (NIH): 108 mg/dL — ABNORMAL HIGH (ref 0–99)
Triglycerides: 55 mg/dL (ref 0–149)
VLDL Cholesterol Cal: 11 mg/dL (ref 5–40)

## 2021-03-16 NOTE — Telephone Encounter (Signed)
LCSW reviewing old CAFA files.  Per chart pt still uninsured, I have called Bradly Bienenstock with [employer] who was previously assisting with Medicaid application to see if any further updates.   Westley Hummer, MSW, [place]  [phone]- work cell phone (preferred) [phone]- desk phone

## 2021-03-20 ENCOUNTER — Encounter: Payer: Self-pay | Admitting: *Deleted

## 2021-03-27 ENCOUNTER — Ambulatory Visit: Payer: Self-pay

## 2021-03-27 NOTE — Telephone Encounter (Signed)
° °  Chief Complaint: Clear vaginal discharge Symptoms: Above Frequency: Started Pertinent Negatives: Patient denies other symptoms. Disposition: [] ED /[] Urgent Care (no appt availability in office) / [] Appointment(In office/virtual)/ []  [facility]/ [] Home Care/ [] Refused Recommended Disposition /[] [facility]/ []  Follow-up with PCP Additional Notes: Pt. Requesting to be seen as soon as possible for vaginal discharge. Please advise.  Answer Assessment - Initial Assessment Questions 1. SYMPTOM: "What's the main symptom you're concerned about?" (e.g., pain, itching, dryness)     Discharge 2. LOCATION: "Where is the  discharge located?" (e.g., inside/outside, left/right)     outside 3. ONSET: "When did the  symptom  start?"     1 week ago 4. PAIN: "Is there any pain?" If Yes, ask: "How bad is it?" (Scale: 1-10; mild, moderate, severe)     No 5. ITCHING: "Is there any itching?" If Yes, ask: "How bad is it?" (Scale: 1-10; mild, moderate, severe)     No 6. CAUSE: "What do you think is causing the discharge?" "Have you had the same problem before? What happened then?"     Yeast infection 7. OTHER SYMPTOMS: "Do you have any other symptoms?" (e.g., fever, itching, vaginal bleeding, pain with urination, injury to genital area, vaginal foreign body)     No 8. PREGNANCY: "Is there any chance you are pregnant?" "When was your last menstrual period?"     No  Protocols used: Vaginal Symptoms-A-AH

## 2021-03-28 NOTE — Telephone Encounter (Signed)
Needs nurse visit for wet prep. I do not have any openings for an actual office visit with me. Or she can always go to the health department for same.

## 2021-03-29 NOTE — Telephone Encounter (Signed)
Called pt made aware of below message. Stated will go the health department tomorrow

## 2021-04-10 ENCOUNTER — Encounter (HOSPITAL_COMMUNITY): Admission: RE | Admit: 2021-04-10 | Payer: Self-pay | Source: Ambulatory Visit

## 2021-04-10 ENCOUNTER — Telehealth (HOSPITAL_COMMUNITY): Payer: Self-pay | Admitting: Nurse Practitioner

## 2021-04-16 ENCOUNTER — Other Ambulatory Visit: Payer: Self-pay

## 2021-04-16 MED ORDER — ASPIRIN 81 MG PO CHEW: 81.0000 mg | CHEWABLE_TABLET | Freq: Every day | ORAL | Status: AC

## 2021-04-17 ENCOUNTER — Other Ambulatory Visit (HOSPITAL_COMMUNITY): Payer: Self-pay

## 2021-04-17 ENCOUNTER — Encounter (HOSPITAL_COMMUNITY)
Admission: RE | Admit: 2021-04-17 | Discharge: 2021-04-17 | Disposition: A | Discharge status: Home / Self Care | Payer: Self-pay | Source: Ambulatory Visit | Attending: Cardiology | Admitting: Cardiology

## 2021-04-17 ENCOUNTER — Encounter (HOSPITAL_COMMUNITY): Admission: RE | Admit: 2021-04-17 | Payer: Self-pay | Source: Ambulatory Visit

## 2021-04-17 ENCOUNTER — Other Ambulatory Visit: Payer: Self-pay

## 2021-04-17 ENCOUNTER — Encounter (HOSPITAL_COMMUNITY): Payer: Self-pay | Admitting: *Deleted

## 2021-04-17 NOTE — Progress Notes (Signed)
Susan Sexton has completed the virtual cardiac rehab program and will continue exercise independently at this time. Patient came in-person today to complete her exit walk test and measurements but was unable to due c/o cold like symptoms, not feeling well. Patient has just started back walking outside in her neighborhood as the weather has gotten warmer. I encouraged her to try and accumulate 150 minutes of walking each week, and she is amenable to this. Patient's virtual cardiac rehab report will be scanned to media for review.   [name], MS, ACSM CEP

## 2021-04-18 ENCOUNTER — Other Ambulatory Visit: Payer: Self-pay

## 2021-04-25 ENCOUNTER — Other Ambulatory Visit: Payer: Self-pay

## 2021-04-27 ENCOUNTER — Other Ambulatory Visit (HOSPITAL_COMMUNITY): Payer: Self-pay

## 2021-05-10 ENCOUNTER — Other Ambulatory Visit: Payer: Self-pay

## 2021-05-15 ENCOUNTER — Telehealth: Payer: Self-pay | Admitting: *Deleted

## 2021-05-15 DIAGNOSIS — Z006 Encounter for examination for normal comparison and control in clinical research program: Secondary | ICD-10-CM

## 2021-05-15 NOTE — Telephone Encounter (Signed)
I called patient about Prevail Study. I left message for patient to call me back.I willsend e-mail to patient also. ?

## 2021-05-16 DIAGNOSIS — Z006 Encounter for examination for normal comparison and control in clinical research program: Secondary | ICD-10-CM

## 2021-05-17 NOTE — Research (Signed)
Patient called office after e-mail was sent about PREVAIL study. Reviewed the trial with her and scheduled the screening visit for March 16th ?

## 2021-05-23 DIAGNOSIS — Z006 Encounter for examination for normal comparison and control in clinical research program: Secondary | ICD-10-CM

## 2021-05-23 NOTE — Research (Signed)
Called patient to remind her of her appointment on 05/24/2021 at 8:30. She states that she is unable to make it because she just started a new job and hasn't been working and doesn't want to miss any days at this time. She asked if she could been seen on a Friday after 12. Informed her I would talk to the Investigators to see if this could be arranged and I would call her back to reschedule the visit. ?

## 2021-06-13 ENCOUNTER — Telehealth: Payer: Self-pay | Admitting: Nurse Practitioner

## 2021-06-13 ENCOUNTER — Encounter: Payer: Self-pay | Admitting: *Deleted

## 2021-06-13 NOTE — Telephone Encounter (Signed)
Copied from CRM [id number]. Topic: General - Other ?>> Jun 13, 2021  9:01 AM Gaetana Michaelis A wrote: ?Reason for CRM: The patient has returned a missed call from the practice ? ?There were no open notes or encounters at the time of call with agent  ? ?Please contact further when possible ?

## 2021-06-13 NOTE — Telephone Encounter (Signed)
Called patient-She requested apt to address concerns with possible yeast infection.  ?Scheduled apt with [facility]- Provider [place] 06/14/2021 ?

## 2021-06-14 ENCOUNTER — Ambulatory Visit (INDEPENDENT_AMBULATORY_CARE_PROVIDER_SITE_OTHER): Payer: Self-pay | Admitting: Physician Assistant

## 2021-06-14 ENCOUNTER — Other Ambulatory Visit: Payer: Self-pay

## 2021-06-14 ENCOUNTER — Other Ambulatory Visit (HOSPITAL_COMMUNITY)
Admission: RE | Admit: 2021-06-14 | Discharge: 2021-06-14 | Disposition: A | Discharge status: Home / Self Care | Payer: Self-pay | Source: Ambulatory Visit | Attending: Physician Assistant | Admitting: Physician Assistant

## 2021-06-14 ENCOUNTER — Encounter: Payer: Self-pay | Admitting: Physician Assistant

## 2021-06-14 VITALS — BP 126/79 | HR 76 | Temp 98.8°F | Resp 18 | Ht 68.0 in | Wt 159.0 lb

## 2021-06-14 DIAGNOSIS — N898 Other specified noninflammatory disorders of vagina: Secondary | ICD-10-CM

## 2021-06-14 DIAGNOSIS — A599 Trichomoniasis, unspecified: Secondary | ICD-10-CM

## 2021-06-14 DIAGNOSIS — E785 Hyperlipidemia, unspecified: Secondary | ICD-10-CM

## 2021-06-14 DIAGNOSIS — N760 Acute vaginitis: Secondary | ICD-10-CM

## 2021-06-14 DIAGNOSIS — B9689 Other specified bacterial agents as the cause of diseases classified elsewhere: Secondary | ICD-10-CM

## 2021-06-14 LAB — POCT URINALYSIS DIP (CLINITEK)
Bilirubin, UA: NEGATIVE
Glucose, UA: NEGATIVE mg/dL
Ketones, POC UA: NEGATIVE mg/dL
Leukocytes, UA: NEGATIVE
Nitrite, UA: NEGATIVE
Spec Grav, UA: >=1.03 — AB (ref 1.010–1.025)
Urobilinogen, UA: 0.2 E.U./dL
pH, UA: 6 (ref 5.0–8.0)

## 2021-06-14 MED ORDER — FLUCONAZOLE 150 MG PO TABS
150.0000 mg | ORAL_TABLET | Freq: Once | ORAL | 0 refills | Status: AC
  Filled 2021-06-14: qty 1, 1d supply, fill #0

## 2021-06-14 NOTE — Patient Instructions (Addendum)
To help with your vaginal itching, you are going to take Diflucan 1 time.  We will call you with the results of your vaginal swab. ? ?We are checking your cholesterol today, we will call you with the results.  I encourage you to call the number below to speak with the financial counselor for patient assistance in getting refills of your Repatha. ? ?Call a RepathaReady? counselor at [phone]) Monday - Friday, 8AM - 9PM ET to learn about potential options for financial support. ? ?Please let us know if there is anything else we can do for you. ? ?Roney Jaffe, PA-C ?Physician Assistant ?[facility] ?[email or web address] ? ? ?Vaginal Yeast Infection, Adult ?Vaginal yeast infection is a condition that causes vaginal discharge as well as soreness, swelling, and redness (inflammation) of the vagina. This is a common condition. Some women get this infection frequently. ?What are the causes? ?This condition is caused by a change in the normal balance of the yeast (Candida) and normal bacteria that live in the vagina. This change causes an overgrowth of yeast, which causes the inflammation. ?What increases the risk? ?The condition is more likely to develop in women who: ?Take antibiotic medicines. ?Have diabetes. ?Take birth control pills. ?Are pregnant. ?Douche often. ?Have a weak body defense system (immune system). ?Have been taking steroid medicines for a long time. ?Frequently wear tight clothing. ?What are the signs or symptoms? ?Symptoms of this condition include: ?White, thick, creamy vaginal discharge. ?Swelling, itching, redness, and irritation of the vagina. The lips of the vagina (labia) may be affected as well. ?Pain or a burning feeling while urinating. ?Pain during sex. ?How is this diagnosed? ?This condition is diagnosed based on: ?Your medical history. ?A physical exam. ?A pelvic exam. Your health care provider will examine a sample  of your vaginal discharge under a microscope. Your health care provider may send this sample for testing to confirm the diagnosis. ?How is this treated? ?This condition is treated with medicine. Medicines may be over-the-counter or prescription. You may be told to use one or more of the following: ?Medicine that is taken by mouth (orally). ?Medicine that is applied as a cream (topically). ?Medicine that is inserted directly into the vagina (suppository). ?Follow these instructions at home: ?Take or apply over-the-counter and prescription medicines only as told by your health care provider. ?Do not use tampons until your health care provider approves. ?Do not have sex until your infection has cleared. Sex can prolong or worsen your symptoms of infection. Ask your health care provider when it is safe to resume sexual activity. ?Keep all follow-up visits. This is important. ?How is this prevented? ? ?Do not wear tight clothes, such as pantyhose or tight pants. ?Wear breathable cotton underwear. ?Do not use douches, perfumed soap, creams, or powders. ?Wipe from front to back after using the toilet. ?If you have diabetes, keep your blood sugar levels under control. ?Ask your health care provider for other ways to prevent yeast infections. ?Contact a health care provider if: ?You have a fever. ?Your symptoms go away and then return. ?Your symptoms do not get better with treatment. ?Your symptoms get worse. ?You have new symptoms. ?You develop blisters in or around your vagina. ?You have blood coming from your vagina and it is not your menstrual period. ?You develop pain in your abdomen. ?Summary ?Vaginal yeast infection is a condition that causes discharge as well as soreness, swelling, and redness (inflammation) of the vagina. ?This condition is treated with  medicine. Medicines may be over-the-counter or prescription. ?Take or apply over-the-counter and prescription medicines only as told by your health care  provider. ?Do not douche. Resume sexual activity or use of tampons as instructed by your health care provider. ?Contact a health care provider if your symptoms do not get better with treatment or your symptoms go away and then return. ?This information is not intended to replace advice given to you by your health care provider. Make sure you discuss any questions you have with your health care provider. ?Document Revised: 05/15/2020 Document Reviewed: 05/15/2020 ?Elsevier Patient Education ? 2022 [employer] ? ?

## 2021-06-14 NOTE — Progress Notes (Signed)
? ?Established Patient Office Visit ? ?Subjective:  ?Patient ID: Susan Sexton, female    DOB: 1985-03-14  Age: 36 y.o. MRN: [id number] ? ?CC:  ?Chief Complaint  ?Patient presents with  ? Vaginitis  ? ? ?HPI ?Susan Sexton states that she has been having vaginal itching, think she saw some thick white discharge, but states that she is finishing her NCVs is unsure if it was related to the.  States that the itching has been present for a couple of days.  Does endorse history of yeast infections, states this feels similar. ? ?Reports that her menses have been more painful than normal.  States that she will have severe cramping.  Has not tried anything for relief. ? ?States that she has not been able to take the Repatha in the past 2 months due to cost.  States that she was set up to do a research study for the medication, but unfortunately is unable to attend due to her work schedule. ? ? ?Past Medical History:  ?Diagnosis Date  ? Asthma   ? Coronary artery disease   ? High cholesterol   ? Hyperlipidemia LDL goal <100 05/04/2020  ? NSTEMI (non-ST elevated myocardial infarction) (HCC)   ? ? ?Past Surgical History:  ?Procedure Laterality Date  ? BREAST LUMPECTOMY Left 06/6/219  ? CARDIAC CATHETERIZATION    ? CORONARY STENT INTERVENTION N/A 05/05/2020  ? Procedure: CORONARY STENT INTERVENTION;  Surgeon: Iran Ouch, MD;  Location: [facility] INVASIVE CV LAB;  Service: Cardiovascular;  Laterality: N/A;  ? CORONARY STENT INTERVENTION N/A 05/23/2020  ? Procedure: CORONARY STENT INTERVENTION;  Surgeon: Corky Crafts, MD;  Location: [facility] INVASIVE CV LAB;  Service: Cardiovascular;  Laterality: N/A;  ? CORONARY STENT PLACEMENT  05/05/2020  ? INCISION AND DRAINAGE BREAST ABSCESS Left 09/2017  ? INTRAVASCULAR ULTRASOUND/IVUS N/A 05/23/2020  ? Procedure: Intravascular Ultrasound/IVUS;  Surgeon: Corky Crafts, MD;  Location: [facility] INVASIVE CV LAB;  Service: Cardiovascular;  Laterality: N/A;  ? LEFT HEART CATH AND CORONARY  ANGIOGRAPHY N/A 05/05/2020  ? Procedure: LEFT HEART CATH AND CORONARY ANGIOGRAPHY;  Surgeon: Iran Ouch, MD;  Location: [facility] INVASIVE CV LAB;  Service: Cardiovascular;  Laterality: N/A;  ? LEFT HEART CATH AND CORONARY ANGIOGRAPHY N/A 05/23/2020  ? Procedure: LEFT HEART CATH AND CORONARY ANGIOGRAPHY;  Surgeon: Corky Crafts, MD;  Location: [facility] INVASIVE CV LAB;  Service: Cardiovascular;  Laterality: N/A;  ? ? ?Family History  ?Problem Relation Age of Onset  ? Hypertension Mother   ? Heart attack Father 17  ?     Had CABG, but died at age 59 after trauma  ? ? ?Social History  ? ?Socioeconomic History  ? Marital status: Single  ?  Spouse name: Not on file  ? Number of children: Not on file  ? Years of education: 54  ? Highest education level: 11th grade  ?Occupational History  ? Not on file  ?Tobacco Use  ? Smoking status: Former  ?  Packs/day: 0.50  ?  Types: Cigarettes  ?  Quit date: 05/06/2020  ?  Years since quitting: 1.1  ? Smokeless tobacco: Never  ? Tobacco comments:  ?  Shelvy was given 1-800-quit-now. Megin says that she is smoking 1 cigarette a week  ?Vaping Use  ? Vaping Use: Former  ? Quit date: 05/06/2020  ?Substance and Sexual Activity  ? Alcohol use: No  ? Drug use: No  ? Sexual activity: Yes  ?  Birth control/protection: None  ?Other  Topics Concern  ? Not on file  ?Social History Narrative  ? Not on file  ? ?Social Determinants of Health  ? ?Financial Resource Strain: Not on file  ?Food Insecurity: Not on file  ?Transportation Needs: Not on file  ?Physical Activity: Not on file  ?Stress: Not on file  ?Social Connections: Not on file  ?Intimate Partner Violence: Not on file  ? ? ?Outpatient Medications Prior to Visit  ?Medication Sig Dispense Refill  ? acetaminophen (TYLENOL) 500 MG tablet Take 500-1,000 mg by mouth every 6 (six) hours as needed (for pain.).    ? albuterol (VENTOLIN HFA) 108 (90 Base) MCG/ACT inhaler Inhale 2 puffs into the lungs every 6 (six) hours as needed for wheezing or  shortness of breath. NEEDS PASS 18 g 1  ? aspirin 81 MG chewable tablet Chew 1 tablet (81 mg total) by mouth daily.    ? atorvastatin (LIPITOR) 80 MG tablet Take 1 tablet (80 mg total) by mouth daily. 90 tablet 3  ? metoprolol succinate (TOPROL-XL) 25 MG 24 hr tablet Take 0.5 tablets (12.5 mg total) by mouth daily. 30 tablet 11  ? Multiple Vitamins-Minerals (MULTI ADULT GUMMIES PO) Take 1 tablet by mouth daily.    ? nitroGLYCERIN (NITROSTAT) 0.4 MG SL tablet Place 1 tablet (0.4 mg total) under the tongue every 5 (five) minutes x 3 doses as needed for chest pain. 25 tablet 2  ? ticagrelor (BRILINTA) 90 MG TABS tablet Take 90 mg by mouth 2 (two) times daily.    ? Evolocumab (REPATHA SURECLICK) 140 MG/ML SOAJ Inject 1 mL into the skin every 14 (fourteen) days. (Patient not taking: Reported on 04/04/2021) 2 mL 5  ? Evolocumab (REPATHA) 140 MG/ML SOSY Inject 1 mL as directed every 14 (fourteen) days. (Patient not taking: Reported on 06/14/2021) 1 mL 9  ? ?No facility-administered medications prior to visit.  ? ? ?No Known Allergies ? ?ROS ?Review of Systems  ?Constitutional: Negative.   ?HENT: Negative.    ?Eyes: Negative.   ?Respiratory:  Negative for shortness of breath.   ?Cardiovascular:  Negative for chest pain.  ?Gastrointestinal:  Negative for abdominal pain, nausea and vomiting.  ?Endocrine: Negative.   ?Genitourinary:  Positive for vaginal discharge. Negative for dysuria, genital sores and hematuria.  ?Musculoskeletal:  Negative for back pain.  ?Skin: Negative.   ?Allergic/Immunologic: Negative.   ?Neurological: Negative.   ?Hematological: Negative.   ?Psychiatric/Behavioral: Negative.    ? ?  ?Objective:  ?  ?Physical Exam ?Vitals and nursing note reviewed.  ?Constitutional:   ?   Appearance: Normal appearance.  ?HENT:  ?   Head: Normocephalic and atraumatic.  ?   Right Ear: External ear normal.  ?   Left Ear: External ear normal.  ?   Nose: Nose normal.  ?   Mouth/Throat:  ?   Mouth: Mucous membranes are moist.   ?   Pharynx: Oropharynx is clear.  ?Eyes:  ?   Extraocular Movements: Extraocular movements intact.  ?   Conjunctiva/sclera: Conjunctivae normal.  ?   Pupils: Pupils are equal, round, and reactive to light.  ?Cardiovascular:  ?   Rate and Rhythm: Normal rate and regular rhythm.  ?   Pulses: Normal pulses.  ?   Heart sounds: Normal heart sounds.  ?Pulmonary:  ?   Effort: Pulmonary effort is normal.  ?   Breath sounds: Normal breath sounds.  ?Musculoskeletal:     ?   General: Normal range of motion.  ?   Cervical  back: Normal range of motion and neck supple.  ?Skin: ?   General: Skin is warm and dry.  ?Neurological:  ?   General: No focal deficit present.  ?   Mental Status: She is alert and oriented to person, place, and time.  ?Psychiatric:     ?   Mood and Affect: Mood normal.     ?   Behavior: Behavior normal.     ?   Thought Content: Thought content normal.     ?   Judgment: Judgment normal.  ? ? ?BP 126/79 (BP Location: Right Arm, Patient Position: Sitting, Cuff Size: Normal)   Pulse 76   Temp 98.8 ?F (37.1 ?C) (Oral)   Resp 18   Ht 5\' 8"  (1.727 m)   Wt 159 lb (72.1 kg)   LMP 06/14/2021 (Approximate) Comment: last day  SpO2 99%   BMI 24.18 kg/m?  ?Wt Readings from Last 3 Encounters:  ?06/14/21 159 lb (72.1 kg)  ?03/15/21 162 lb 12.8 oz (73.8 kg)  ?11/28/20 162 lb 7.7 oz (73.7 kg)  ? ? ? ?Health Maintenance Due  ?Topic Date Due  ? COVID-19 Vaccine (1) Never done  ? Hepatitis C Screening  Never done  ? TETANUS/TDAP  Never done  ? PAP SMEAR-Modifier  Never done  ? ? ?There are no preventive care reminders to display for this patient. ? ?Lab Results  ?Component Value Date  ? TSH 4.381 05/04/2020  ? ?Lab Results  ?Component Value Date  ? WBC 9.1 03/15/2021  ? HGB 13.0 03/15/2021  ? HCT 39.0 03/15/2021  ? MCV 93 03/15/2021  ? PLT 241 03/15/2021  ? ?Lab Results  ?Component Value Date  ? NA 143 03/15/2021  ? K 3.8 03/15/2021  ? CO2 25 03/15/2021  ? GLUCOSE 84 03/15/2021  ? BUN 14 03/15/2021  ? CREATININE 0.84  03/15/2021  ? BILITOT 0.3 03/15/2021  ? ALKPHOS 113 03/15/2021  ? AST 17 03/15/2021  ? ALT 13 03/15/2021  ? PROT 7.7 03/15/2021  ? ALBUMIN 5.1 (H) 03/15/2021  ? CALCIUM 10.1 03/15/2021  ? ANIONGAP 9 05/24/2020  ? EGF

## 2021-06-15 LAB — LIPID PANEL
Chol/HDL Ratio: 4.4 ratio (ref 0.0–4.4)
Cholesterol, Total: 212 mg/dL — ABNORMAL HIGH (ref 100–199)
HDL: 48 mg/dL (ref >39)
LDL Chol Calc (NIH): 157 mg/dL — ABNORMAL HIGH (ref 0–99)
Triglycerides: 39 mg/dL (ref 0–149)
VLDL Cholesterol Cal: 7 mg/dL (ref 5–40)

## 2021-06-15 LAB — CERVICOVAGINAL ANCILLARY ONLY
Bacterial Vaginitis (gardnerella): POSITIVE — AB
Candida Glabrata: NEGATIVE
Candida Vaginitis: NEGATIVE
Chlamydia: NEGATIVE
Comment: NEGATIVE
Comment: NEGATIVE
Comment: NEGATIVE
Comment: NEGATIVE
Comment: NEGATIVE
Comment: NORMAL
Neisseria Gonorrhea: NEGATIVE
Trichomonas: POSITIVE — AB

## 2021-06-18 ENCOUNTER — Other Ambulatory Visit: Payer: Self-pay

## 2021-06-18 ENCOUNTER — Telehealth: Payer: Self-pay | Admitting: Physician Assistant

## 2021-06-18 ENCOUNTER — Encounter: Payer: Self-pay | Admitting: Physician Assistant

## 2021-06-18 ENCOUNTER — Telehealth: Payer: Self-pay | Admitting: *Deleted

## 2021-06-18 MED ORDER — EZETIMIBE 10 MG PO TABS
10.0000 mg | ORAL_TABLET | Freq: Every day | ORAL | 3 refills | Status: DC
  Filled 2021-06-18: qty 90, 90d supply, fill #0

## 2021-06-18 MED ORDER — METRONIDAZOLE 500 MG PO TABS
500.0000 mg | ORAL_TABLET | Freq: Two times a day (BID) | ORAL | 0 refills | Status: AC
  Filled 2021-06-18: qty 14, 7d supply, fill #0

## 2021-06-18 NOTE — Addendum Note (Signed)
Addended by: Kennieth Rad on: 06/18/2021 10:01 AM ? ? Modules accepted: Orders ? ?

## 2021-06-18 NOTE — Telephone Encounter (Signed)
-----   Message from Kennieth Rad, PA-C sent at 06/18/2021 10:01 AM EDT ----- ?Please call patient and let her know that her cholesterol levels are elevated again since stopping Repatha.  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 ? ?Her vaginal swab was positive for trichomonas as well as bacterial vaginitis.  She will need to take metronidazole twice daily for 7 days, she needs to avoid sexual intercourse until treatment is completed, and she should be retested for test of cure 3 weeks after she completes her treatment.  Medications sent to her pharmacy. ?

## 2021-06-18 NOTE — Telephone Encounter (Signed)
Patient will be contacted with results and treatment. Patients labs were completed on 4/6. Office was closed 4/7-4/9. Provider will review results and send notice to MA and patient. ?

## 2021-06-18 NOTE — Telephone Encounter (Signed)
Patient verified DOB ?Patient is aware of results and will pick up medication today and have partner tested and treated. ? ?

## 2021-06-18 NOTE — Telephone Encounter (Signed)
Copied from CRM [id number]. Topic: General - Inquiry ?>> Jun 18, 2021  8:49 AM Aretta Nip wrote: ?Reason for CRM: Pt seen  by Cari/ for mobile bus Fri/ pt has seen that + for STD, pt wants med prescribed/ Called office and asked to forward CRM FU with pt to advise [phone] ?

## 2021-06-27 ENCOUNTER — Encounter: Payer: Self-pay | Admitting: Cardiology

## 2021-07-05 ENCOUNTER — Ambulatory Visit: Payer: Self-pay | Admitting: Physician Assistant

## 2021-07-09 ENCOUNTER — Telehealth: Payer: Self-pay | Admitting: Cardiology

## 2021-07-09 NOTE — Telephone Encounter (Signed)
Pt sent this Via Mychart to the sching Pool:   ? ?Good morning I think I?m having a lil discomfort in my chest because I have been very stressed for the past few weeks and working maybe too much and I have not taken a nitroglycerin yet. The discomfort started yesterday while I was arguing. It has lighting up since lastnite I just  want to be sure. ? ? ? ?Good Morning Susan Sexton,  ?Are you feeling Discomfort in your chest.  If so, can you tell me a little more about it?   ? ? ? ?1. Are you having CP right now?  ? ?2. Are you experiencing any other symptoms (ex. SOB, nausea, vomiting, sweating)?  ? ?3. How long have you been experiencing CP?  ? ?4. Is your CP continuous or coming and going?  ? ?5. Have you taken Nitroglycerin?  ??  ? ? ? ? ?Appointment Request From: [name] ? ?With Provider: Little Ishikawa, MD [facility]] ? ?Preferred Date Range: Any date 07/09/2021 or later ? ?Preferred Times: Any Time ? ?Reason for visit: Office Visit ? ?Comments: ?I been feeling discomfort and I need to get checked  ?

## 2021-07-09 NOTE — Telephone Encounter (Signed)
Spoke to patient she stated she has been having chest pain off and on for the past 2 days.No chest pain at present.Appointment scheduled with Dr.Schumann 5/4 at 3:40 pm.Advised to go to ED if she starts having chest pain. ?

## 2021-07-10 ENCOUNTER — Emergency Department (HOSPITAL_BASED_OUTPATIENT_CLINIC_OR_DEPARTMENT_OTHER)
Admission: EM | Admit: 2021-07-10 | Discharge: 2021-07-10 | Disposition: A | Discharge status: Home / Self Care | Payer: Self-pay | Attending: Emergency Medicine | Admitting: Emergency Medicine

## 2021-07-10 ENCOUNTER — Other Ambulatory Visit: Payer: Self-pay

## 2021-07-10 ENCOUNTER — Emergency Department (HOSPITAL_BASED_OUTPATIENT_CLINIC_OR_DEPARTMENT_OTHER): Payer: Self-pay

## 2021-07-10 ENCOUNTER — Encounter (HOSPITAL_BASED_OUTPATIENT_CLINIC_OR_DEPARTMENT_OTHER): Payer: Self-pay | Admitting: Emergency Medicine

## 2021-07-10 DIAGNOSIS — R42 Dizziness and giddiness: Secondary | ICD-10-CM | POA: Insufficient documentation

## 2021-07-10 DIAGNOSIS — R079 Chest pain, unspecified: Secondary | ICD-10-CM

## 2021-07-10 DIAGNOSIS — Z7982 Long term (current) use of aspirin: Secondary | ICD-10-CM | POA: Insufficient documentation

## 2021-07-10 DIAGNOSIS — F17200 Nicotine dependence, unspecified, uncomplicated: Secondary | ICD-10-CM | POA: Insufficient documentation

## 2021-07-10 DIAGNOSIS — R072 Precordial pain: Secondary | ICD-10-CM | POA: Insufficient documentation

## 2021-07-10 DIAGNOSIS — Z79899 Other long term (current) drug therapy: Secondary | ICD-10-CM | POA: Insufficient documentation

## 2021-07-10 LAB — BASIC METABOLIC PANEL
Anion gap: 7 (ref 5–15)
BUN: 10 mg/dL (ref 6–20)
CO2: 25 mmol/L (ref 22–32)
Calcium: 9.4 mg/dL (ref 8.9–10.3)
Chloride: 105 mmol/L (ref 98–111)
Creatinine, Ser: 0.76 mg/dL (ref 0.44–1.00)
GFR, Estimated: >60 mL/min (ref >60)
Glucose, Bld: 101 mg/dL — ABNORMAL HIGH (ref 70–99)
Potassium: 3.7 mmol/L (ref 3.5–5.1)
Sodium: 137 mmol/L (ref 135–145)

## 2021-07-10 LAB — CBC
HCT: 37.4 % (ref 36.0–46.0)
Hemoglobin: 12.7 g/dL (ref 12.0–15.0)
MCH: 31.4 pg (ref 26.0–34.0)
MCHC: 34 g/dL (ref 30.0–36.0)
MCV: 92.3 fL (ref 80.0–100.0)
Platelets: 249 10*3/uL (ref 150–400)
RBC: 4.05 MIL/uL (ref 3.87–5.11)
RDW: 13.3 % (ref 11.5–15.5)
WBC: 9.1 10*3/uL (ref 4.0–10.5)
nRBC: 0 % (ref 0.0–0.2)

## 2021-07-10 LAB — TROPONIN I (HIGH SENSITIVITY)
Troponin I (High Sensitivity): <2 ng/L (ref <18)
Troponin I (High Sensitivity): 2 ng/L (ref <18)

## 2021-07-10 MED ORDER — ASPIRIN 81 MG PO CHEW
324.0000 mg | CHEWABLE_TABLET | Freq: Once | ORAL | Status: AC
  Administered 2021-07-10: 324 mg via ORAL
  Filled 2021-07-10: qty 4

## 2021-07-10 NOTE — ED Provider Notes (Signed)
?[facility] EMERGENCY DEPARTMENT ?Provider Note ? ? ?CSN: [id number] ?Arrival date & time: 07/10/21  0825 ? ?  ? ?History ? ?Chief Complaint  ?Patient presents with  ? Dizziness  ? Chest Pain  ? ? ?Susan Sexton is a 36 y.o. female.  She has a prior history of an NSTEMI and a cardiac stent.  She is here with a complaint of a few days of intermittent chest pain.  She describes it a pressure that last for 1 to 2 minutes at a time.  She attributes it to stress as she said she has been working a lot and having a lot of arguments.  She is a smoker.  She has not tried any nitroglycerin for it.  Not associated with any shortness of breath although did feel little dizzy.  No diaphoresis. ? ?The history is provided by the patient.  ?Chest Pain ?Pain location:  Substernal area ?Pain quality: pressure   ?Pain radiates to:  Does not radiate ?Pain severity:  Mild ?Onset quality:  Gradual ?Duration:  2 days ?Timing:  Intermittent ?Progression:  Unchanged ?Chronicity:  New ?Context: stress   ?Relieved by:  None tried ?Worsened by:  Nothing ?Ineffective treatments:  None tried ?Associated symptoms: dizziness   ?Associated symptoms: no abdominal pain, no back pain, no fever, no nausea, no shortness of breath, no syncope and no vomiting   ?Risk factors: coronary artery disease, high cholesterol and smoking   ? ?  ? ?Home Medications ?Prior to Admission medications   ?Medication Sig Start Date End Date Taking? Authorizing Provider  ?acetaminophen (TYLENOL) 500 MG tablet Take 500-1,000 mg by mouth every 6 (six) hours as needed (for pain.).    [provider]  ?albuterol (VENTOLIN HFA) 108 (90 Base) MCG/ACT inhaler Inhale 2 puffs into the lungs every 6 (six) hours as needed for wheezing or shortness of breath. NEEDS PASS 07/24/20   Claiborne Rigg, NP  ?aspirin 81 MG chewable tablet Chew 1 tablet (81 mg total) by mouth daily. 04/16/21   Little Ishikawa, MD  ?atorvastatin (LIPITOR) 80 MG tablet Take 1 tablet (80  mg total) by mouth daily. 09/28/20   Little Ishikawa, MD  ?ezetimibe (ZETIA) 10 MG tablet Take 1 tablet (10 mg total) by mouth daily. 06/18/21   Mayers, Cari S, PA-C  ?metoprolol succinate (TOPROL-XL) 25 MG 24 hr tablet Take 0.5 tablets (12.5 mg total) by mouth daily. 05/24/20   Corky Crafts, MD  ?Multiple Vitamins-Minerals (MULTI ADULT GUMMIES PO) Take 1 tablet by mouth daily.    [provider]  ?nitroGLYCERIN (NITROSTAT) 0.4 MG SL tablet Place 1 tablet (0.4 mg total) under the tongue every 5 (five) minutes x 3 doses as needed for chest pain. 05/06/20   Corrin Parker, PA-C  ?ticagrelor (BRILINTA) 90 MG TABS tablet Take 90 mg by mouth 2 (two) times daily.    [provider]  ?   ? ?Allergies    ?Patient has no known allergies.   ? ?Review of Systems   ?Review of Systems  ?Constitutional:  Negative for fever.  ?HENT:  Negative for sore throat.   ?Respiratory:  Negative for shortness of breath.   ?Cardiovascular:  Positive for chest pain. Negative for syncope.  ?Gastrointestinal:  Negative for abdominal pain, nausea and vomiting.  ?Genitourinary:  Negative for dysuria.  ?Musculoskeletal:  Negative for back pain.  ?Skin:  Negative for rash.  ?Neurological:  Positive for dizziness.  ? ?Physical Exam ?Updated Vital Signs ?BP  127/89 (BP Location: Right Arm)   Pulse 65   Temp 98.3 ?F (36.8 ?C) (Oral)   Resp 18   Ht 5\' 8"  (1.727 m)   Wt 71.7 kg   LMP 06/14/2021 (Approximate) Comment: last day  SpO2 100%   BMI 24.02 kg/m?  ?Physical Exam ?Vitals and nursing note reviewed.  ?Constitutional:   ?   General: She is not in acute distress. ?   Appearance: Normal appearance. She is well-developed.  ?HENT:  ?   Head: Normocephalic and atraumatic.  ?Eyes:  ?   Conjunctiva/sclera: Conjunctivae normal.  ?Cardiovascular:  ?   Rate and Rhythm: Normal rate and regular rhythm.  ?   Heart sounds: No murmur heard. ?Pulmonary:  ?   Effort: Pulmonary effort is normal. No respiratory distress.  ?    Breath sounds: Normal breath sounds.  ?Abdominal:  ?   Palpations: Abdomen is soft.  ?   Tenderness: There is no abdominal tenderness. There is no guarding or rebound.  ?Musculoskeletal:     ?   General: No swelling.  ?   Cervical back: Neck supple.  ?   Right lower leg: No edema.  ?   Left lower leg: No edema.  ?Skin: ?   General: Skin is warm and dry.  ?   Capillary Refill: Capillary refill takes less than 2 seconds.  ?Neurological:  ?   General: No focal deficit present.  ?   Mental Status: She is alert and oriented to person, place, and time.  ?   Cranial Nerves: No cranial nerve deficit.  ?   Sensory: No sensory deficit.  ?   Motor: No weakness.  ?   Gait: Gait normal.  ? ? ?ED Results / Procedures / Treatments   ?Labs ?(all labs ordered are listed, but only abnormal results are displayed) ?Labs Reviewed  ?BASIC METABOLIC PANEL - Abnormal; Notable for the following components:  ?    Result Value  ? Glucose, Bld 101 (*)   ? All other components within normal limits  ?CBC  ?TROPONIN I (HIGH SENSITIVITY)  ?TROPONIN I (HIGH SENSITIVITY)  ? ? ?EKG ?EKG Interpretation ? ?Date/Time:  Tuesday Jul 10 2021 08:34:46 EDT ?Ventricular Rate:  76 ?PR Interval:  159 ?QRS Duration: 77 ?QT Interval:  384 ?QTC Calculation: 432 ?R Axis:   11 ?Text Interpretation: Sinus rhythm nonspecific ST changes inferior Confirmed by 04-28-1994 [phone]) on 07/10/2021 8:36:43 AM ? ?Radiology ?DG Chest Port 1 View ? ?Result Date: 07/10/2021 ?CLINICAL DATA:  Chest pain. EXAM: PORTABLE CHEST 1 VIEW COMPARISON:  May 04, 2020. FINDINGS: No consolidation. No visible pleural effusions or pneumothorax. Cardiomediastinal silhouette is within normal limits. No displaced fracture. IMPRESSION: No evidence of acute cardiopulmonary disease. Electronically Signed   By: May 06, 2020 M.D.   On: 07/10/2021 09:29   ? ?Procedures ?Procedures  ? ? ?Medications Ordered in ED ?Medications  ?aspirin chewable tablet 324 mg (324 mg Oral Given 07/10/21 0859)   ? ? ?ED Course/ Medical Decision Making/ A&P ?Clinical Course as of 07/10/21 1648  ?Tue Jul 10, 2021  ?0929 Chest x-ray interpreted by me as no acute infiltrates no pneumothorax.  Awaiting radiology reading. [MB]  ?  ?Clinical Course User Index ?[MB] 0930, MD  ? ?                        ?Medical Decision Making ?Amount and/or Complexity of Data Reviewed ?Labs: ordered. ?Radiology: ordered. ?ECG/medicine tests: ordered. ? ?  Risk ?OTC drugs. ? ?This patient complains of chest pain dizziness; this involves an extensive number of treatment ?Options and is a complaint that carries with it a high risk of complications and ?morbidity. The differential includes ACS, GERD, musculoskeletal, anxiety, pneumonia, pneumothorax, vascular ? ?I ordered, reviewed and interpreted labs, which included CBC with normal white count normal hemoglobin, chemistries normal, troponins flat ?I ordered medication oral aspirin and reviewed PMP when indicated. ?I ordered imaging studies which included chest x-ray and I independently ?   visualized and interpreted imaging which showed no acute findings ?Previous records obtained and reviewed in epic including prior cardiology notes ?Cardiac monitoring reviewed, patient in normal sinus rhythm ?Social determinants considered, no significant barriers ?Critical Interventions: None ? ?After the interventions stated above, I reevaluated the patient and found patient's chest pain to be improved ?Admission and further testing considered, no indications for admission at this time.  She is comfortable plan for outpatient follow-up with her cardiology team.  Return instructions discussed ? ? ? ? ? ? ? ? ? ?Final Clinical Impression(s) / ED Diagnoses ?Final diagnoses:  ?Nonspecific chest pain  ? ? ?Rx / DC Orders ?ED Discharge Orders   ? ?      Ordered  ?  Ambulatory referral to Cardiology       ?Comments: If you have not heard from the Cardiology office within the next 72 hours please call  [phone].  ? 07/10/21 1142  ? ?  ?  ? ?  ? ? ?  ?Terrilee FilesButler, Dezzie Badilla C, MD ?07/10/21 1650 ? ?

## 2021-07-10 NOTE — ED Notes (Signed)
Warm blanket given to patient.  ?Denies any other needs at this time. ? ?

## 2021-07-10 NOTE — ED Triage Notes (Signed)
Intermittent dizziness and chest pain since yesterday.  ?

## 2021-07-10 NOTE — Telephone Encounter (Signed)
Agree with plan 

## 2021-07-10 NOTE — Discharge Instructions (Addendum)
You were seen in the emergency department for intermittent chest pain.  You had lab work EKG and a chest x-ray that did not show any evidence of heart attack.  Please follow-up with your cardiologist.  Continue your regular medication.  Return to the emergency department if any worsening or concerning symptoms. ?

## 2021-07-10 NOTE — ED Notes (Signed)
Dc instructions reviewed with pt no questions or concerns at this time. Will follow up with cardiology. 

## 2021-07-12 ENCOUNTER — Ambulatory Visit (INDEPENDENT_AMBULATORY_CARE_PROVIDER_SITE_OTHER): Payer: Self-pay | Admitting: Cardiology

## 2021-07-12 ENCOUNTER — Other Ambulatory Visit: Payer: Self-pay

## 2021-07-12 ENCOUNTER — Encounter: Payer: Self-pay | Admitting: Cardiology

## 2021-07-12 VITALS — BP 126/81 | HR 72 | Ht 68.0 in | Wt 160.6 lb

## 2021-07-12 DIAGNOSIS — R42 Dizziness and giddiness: Secondary | ICD-10-CM

## 2021-07-12 DIAGNOSIS — Z720 Tobacco use: Secondary | ICD-10-CM

## 2021-07-12 DIAGNOSIS — I2510 Atherosclerotic heart disease of native coronary artery without angina pectoris: Secondary | ICD-10-CM

## 2021-07-12 DIAGNOSIS — E785 Hyperlipidemia, unspecified: Secondary | ICD-10-CM

## 2021-07-12 MED ORDER — METOPROLOL SUCCINATE ER 25 MG PO TB24
12.5000 mg | ORAL_TABLET | Freq: Every day | ORAL | 3 refills | Status: DC
  Filled 2021-07-12: qty 45, 90d supply, fill #0
  Filled 2022-01-05 – 2022-02-20 (×3): qty 45, 90d supply, fill #1
  Filled 2022-05-27: qty 45, 90d supply, fill #2

## 2021-07-12 MED ORDER — TICAGRELOR 90 MG PO TABS
90.0000 mg | ORAL_TABLET | Freq: Two times a day (BID) | ORAL | 3 refills | Status: DC
  Filled 2021-07-12: qty 180, 90d supply, fill #0
  Filled 2021-08-20 – 2021-10-01 (×3): qty 60, 30d supply, fill #0
  Filled 2021-10-10: qty 180, 90d supply, fill #0
  Filled 2022-01-05 – 2022-02-20 (×3): qty 180, 90d supply, fill #1
  Filled 2022-05-27: qty 180, 90d supply, fill #2

## 2021-07-12 MED ORDER — ATORVASTATIN CALCIUM 80 MG PO TABS
80.0000 mg | ORAL_TABLET | Freq: Every day | ORAL | 3 refills | Status: DC
  Filled 2021-07-12: qty 90, 90d supply, fill #0
  Filled 2022-01-05: qty 90, 90d supply, fill #1
  Filled 2022-02-20 (×2): qty 30, 30d supply, fill #1
  Filled 2022-05-27: qty 30, 30d supply, fill #2

## 2021-07-12 MED ORDER — EZETIMIBE 10 MG PO TABS
10.0000 mg | ORAL_TABLET | Freq: Every day | ORAL | 3 refills | Status: DC
  Filled 2021-07-12 – 2022-01-05 (×2): qty 90, 90d supply, fill #0
  Filled 2022-02-20 (×2): qty 30, 30d supply, fill #0
  Filled 2022-04-06: qty 30, 30d supply, fill #1
  Filled 2022-05-27: qty 30, 30d supply, fill #2

## 2021-07-12 NOTE — Patient Instructions (Signed)
Medication Instructions:  ?Your physician recommends that you continue on your current medications as directed. Please refer to the Current Medication list given to you today. ? ?*If you need a refill on your cardiac medications before your next appointment, please call your pharmacy* ? ?Follow-Up: ?At [facility], you and your health needs are our priority.  As part of our continuing mission to provide you with exceptional heart care, we have created designated Provider Care Teams.  These Care Teams include your primary Cardiologist (physician) and Advanced Practice Providers (APPs -  Physician Assistants and Nurse Practitioners) who all work together to provide you with the care you need, when you need it. ? ?We recommend signing up for the patient portal called "MyChart".  Sign up information is provided on this After Visit Summary.  MyChart is used to connect with patients for Virtual Visits (Telemedicine).  Patients are able to view lab/test results, encounter notes, upcoming appointments, etc.  Non-urgent messages can be sent to your provider as well.   ?To learn more about what you can do with MyChart, go to [email or web address].   ? ?Your next appointment:   ?1st available with pharmD lipid clinic ?3 months with NP/PA ?6 months with Dr. Bjorn Pippin ? ? ?Important Information About Sugar ? ? ? ? ? ? ?

## 2021-07-12 NOTE — Progress Notes (Signed)
?Cardiology Office Note:   ? ?Date:  07/13/2021  ? ?ID:  LUCI Sexton, DOB [birth year], MRN [id number] ? ?PCP:  Claiborne Rigg, NP  ?Cardiologist:  Little Ishikawa, MD  ?Electrophysiologist:  None  ? ?Referring MD: Claiborne Rigg, NP  ? ?Chief Complaint  ?Patient presents with  ? Coronary Artery Disease  ? ? ?History of Present Illness:   ? ?Susan Sexton is a 36 y.o. female with a hx of CAD, hyperlipidemia, tobacco use who presents for follow-up.  She was admitted to [facility] on 05/04/2020 with NSTEMI.  She had presented to med [facility] ED with chest pain.  Found to have mildly elevated troponins (peak 156).  Despite her age, she was noted to have significant CAD risk factors as suspected FH given LDL to 57 and family history including father having CABG in his 1s.  Echocardiogram was done on 05/05/2020 which showed normal biventricular function, no significant valvular disease.  LHC on 05/05/2020 showed 99% proximal RCA stenosis, 50% proximal to mid RCA, 20% distal RCA, 40% proximal to mid LCx, 80% proximal to mid LAD.  RCA disease was treated with DES.  Staged PCI to the LAD was done on 05/23/2020.  Underwent DES x2 to proximal/mid LAD.  This was complicated by likely guide catheter dissection of the left main, which was treated with DES. ? ?Since last clinic visit, she reports that she is doing okay.  Reports has been having chest pain recently, describes as dull pain on left side of chest, last for few seconds and resolves.  Went to ED yesterday, troponins negative.  Unclear cause, seems to be related to stress.  States that does not feel like the pain she had before her MI.  She denies any shortness of breath.  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  Reports she fatigues easily at work but denies any exertional chest pain or dyspnea.  Reports intermittent lightheadedness but denies any syncope.  Denies any lower extremity edema or palpitations.   Reports she quit smoking cigarettes. ? ?Past Medical History:  ?Diagnosis Date  ? Asthma   ? Coronary artery disease   ? High cholesterol   ? Hyperlipidemia LDL goal <100 05/04/2020  ? NSTEMI (non-ST elevated myocardial infarction) (HCC)   ? ? ?Past Surgical History:  ?Procedure Laterality Date  ? BREAST LUMPECTOMY Left 06/6/219  ? CARDIAC CATHETERIZATION    ? CORONARY STENT INTERVENTION N/A 05/05/2020  ? Procedure: CORONARY STENT INTERVENTION;  Surgeon: Iran Ouch, MD;  Location: [facility] INVASIVE CV LAB;  Service: Cardiovascular;  Laterality: N/A;  ? CORONARY STENT INTERVENTION N/A 05/23/2020  ? Procedure: CORONARY STENT INTERVENTION;  Surgeon: Corky Crafts, MD;  Location: [facility] INVASIVE CV LAB;  Service: Cardiovascular;  Laterality: N/A;  ? CORONARY STENT PLACEMENT  05/05/2020  ? INCISION AND DRAINAGE BREAST ABSCESS Left 09/2017  ? INTRAVASCULAR ULTRASOUND/IVUS N/A 05/23/2020  ? Procedure: Intravascular Ultrasound/IVUS;  Surgeon: Corky Crafts, MD;  Location: [facility] INVASIVE CV LAB;  Service: Cardiovascular;  Laterality: N/A;  ? LEFT HEART CATH AND CORONARY ANGIOGRAPHY N/A 05/05/2020  ? Procedure: LEFT HEART CATH AND CORONARY ANGIOGRAPHY;  Surgeon: Iran Ouch, MD;  Location: [facility] INVASIVE CV LAB;  Service: Cardiovascular;  Laterality: N/A;  ? LEFT HEART CATH AND CORONARY ANGIOGRAPHY N/A 05/23/2020  ? Procedure: LEFT HEART CATH AND CORONARY ANGIOGRAPHY;  Surgeon: Corky Crafts, MD;  Location: [facility] INVASIVE CV LAB;  Service: Cardiovascular;  Laterality: N/A;  ? ? ?Current Medications: ?Current Meds  ?Medication Sig  ? acetaminophen (TYLENOL) 500 MG tablet Take 500-1,000 mg by mouth every 6 (six) hours as needed (for pain.).  ? albuterol (VENTOLIN HFA) 108 (90 Base) MCG/ACT inhaler Inhale 2 puffs into the lungs every 6 (six) hours as needed for wheezing or shortness of breath. NEEDS PASS  ? aspirin 81 MG chewable tablet Chew 1 tablet (81 mg total) by mouth daily.  ? Multiple Vitamins-Minerals  (MULTI ADULT GUMMIES PO) Take 1 tablet by mouth daily.  ? nitroGLYCERIN (NITROSTAT) 0.4 MG SL tablet Place 1 tablet (0.4 mg total) under the tongue every 5 (five) minutes x 3 doses as needed for chest pain.  ? [DISCONTINUED] atorvastatin (LIPITOR) 80 MG tablet Take 1 tablet (80 mg total) by mouth daily.  ? [DISCONTINUED] ezetimibe (ZETIA) 10 MG tablet Take 1 tablet (10 mg total) by mouth daily.  ? [DISCONTINUED] metoprolol succinate (TOPROL-XL) 25 MG 24 hr tablet Take 0.5 tablets (12.5 mg total) by mouth daily.  ? [DISCONTINUED] ticagrelor (BRILINTA) 90 MG TABS tablet Take 90 mg by mouth 2 (two) times daily.  ?  ? ?Allergies:   Patient has no known allergies.  ? ?Social History  ? ?Socioeconomic History  ? Marital status: Single  ?  Spouse name: Not on file  ? Number of children: Not on file  ? Years of education: 711  ? Highest education level: 11th grade  ?Occupational History  ? Not on file  ?Tobacco Use  ? Smoking status: Former  ?  Packs/day: 0.50  ?  Types: Cigarettes  ?  Quit date: 05/06/2020  ?  Years since quitting: 1.1  ? Smokeless tobacco: Never  ? Tobacco comments:  ?  Cedar was given 1-800-quit-now. Glee ArvinLatoya says that she is smoking 1 cigarette a week  ?Vaping Use  ? Vaping Use: Former  ? Quit date: 05/06/2020  ?Substance and Sexual Activity  ? Alcohol use: No  ? Drug use: No  ? Sexual activity: Yes  ?  Birth control/protection: None  ?Other Topics Concern  ? Not on file  ?Social History Narrative  ? Not on file  ? ?Social Determinants of Health  ? ?Financial Resource Strain: Not on file  ?Food Insecurity: Not on file  ?Transportation Needs: Not on file  ?Physical Activity: Not on file  ?Stress: Not on file  ?Social Connections: Not on file  ?  ? ?Family History: ?The patient's family history includes Heart attack (age of onset: 7636) in her father; Hypertension in her mother. ? ?ROS:   ?Please see the history of present illness.  ? ?All other systems reviewed and are negative. ? ?EKGs/Labs/Other Studies  Reviewed:   ? ?The following studies were reviewed today: ? ? ?EKG:   ?05/17/2020: normal sinus rhythm, rate 91, no ST/T abnormalities ?07/10/2020: normal sinus rhythm, rate 66 bpm, no ST/T abnormalities ? ? ?Recent Labs: ?03/15/2021: ALT 13 ?07/10/2021: BUN 10; Creatinine, Ser 0.76; Hemoglobin 12.7; Platelets 249; Potassium 3.7; Sodium 137  ?Recent Lipid Panel ?   ?Component Value Date/Time  ? CHOL 212 (H) 06/14/2021 1109  ? TRIG 39 06/14/2021 1109  ? HDL 48 06/14/2021 1109  ? CHOLHDL 4.4 06/14/2021 1109  ? CHOLHDL 4.7 05/05/2020 0012  ? VLDL 10 05/05/2020 0012  ? LDLCALC 157 (H) 06/14/2021 1109  ? ? ?Physical Exam:   ? ?VS:  BP 126/81   Pulse 72   Ht 5\' 8"  (1.727 m)   Wt 160 lb 9.6 oz (  72.8 kg)   LMP 06/14/2021 (Approximate) Comment: last day  SpO2 98%   BMI 24.42 kg/m?    ? ?Wt Readings from Last 3 Encounters:  ?07/12/21 160 lb 9.6 oz (72.8 kg)  ?07/10/21 158 lb (71.7 kg)  ?06/14/21 159 lb (72.1 kg)  ?  ? ?GEN:  Well nourished, well developed in no acute distress ?HEENT: Normal ?NECK: No JVD; No carotid bruits ?CARDIAC: RRR, no murmurs, rubs, gallops ?RESPIRATORY:  Clear to auscultation without rales, wheezing or rhonchi  ?ABDOMEN: Soft, non-tender, non-distended ?MUSCULOSKELETAL:  No edema; No deformity  ?SKIN: Warm and dry ?NEUROLOGIC:  Alert and oriented x 3 ?PSYCHIATRIC:  Normal affect  ? ?ASSESSMENT:   ? ?1. CAD in native artery   ?2. Hyperlipidemia, unspecified hyperlipidemia type   ?3. Lightheadedness   ?4. Tobacco use   ? ? ? ?PLAN:   ? ?CAD: Presented with NSTEMI 05/04/2020.  Echocardiogram was done on 05/05/2020 which showed normal biventricular function, no significant valvular disease.  LHC on 05/05/2020 showed 99% proximal RCA stenosis, 50% proximal to mid RCA, 20% distal RCA, 40% proximal to mid LCx, 80% proximal to mid LAD.  RCA disease was treated with DES.  Staged PCI to the LAD was done on 05/23/2020.  Underwent DES x2 to proximal/mid LAD.  This was complicated by likely guide catheter dissection of  the left main, which was treated with DES. ?-Continue aspirin 81 mg daily, ticagrelor 90 mg twice daily.  Given extensive stenting, will continue DAPT beyond 1 year ?-Continue atorvastatin/Zetia, referred to lipid clin

## 2021-07-13 ENCOUNTER — Other Ambulatory Visit: Payer: Self-pay

## 2021-07-16 ENCOUNTER — Other Ambulatory Visit: Payer: Self-pay

## 2021-07-16 ENCOUNTER — Encounter: Payer: Self-pay | Admitting: Pharmacist Clinician (PhC)/ Clinical Pharmacy Specialist

## 2021-07-16 ENCOUNTER — Telehealth: Payer: Self-pay | Admitting: Pharmacist Clinician (PhC)/ Clinical Pharmacy Specialist

## 2021-07-16 NOTE — Progress Notes (Signed)
error    This encounter was created in error - please disregard.

## 2021-07-16 NOTE — Telephone Encounter (Signed)
Patient previously had been on Repatha, had to discontinue because of lack of insurance.  She is now insured through new employer, although does not have cards.  Will have her contact us with this information as soon as possible and we can start new PA for Repatha.   ?

## 2021-07-17 ENCOUNTER — Telehealth: Payer: Self-pay | Admitting: Cardiology

## 2021-07-17 NOTE — Telephone Encounter (Signed)
?  Per MyChart scheduling message: ? ?Hello yesterday when I was there she said she was going to give me a sample of the [place] in I haven?t taken in three days and I was wondering can I put it out so I can pick it up today at 4:30? ?

## 2021-07-17 NOTE — Telephone Encounter (Signed)
Patient calling the office for samples of medication: ? ? ?1.  What medication and dosage are you requesting samples for? ticagrelor (BRILINTA) 90 MG TABS tablet ? ?2.  Are you currently out of this medication? Yes, patient has been out for several days ?  ?

## 2021-07-17 NOTE — Telephone Encounter (Signed)
Another call is placed in the system- this is being handled.  ?Thanks! ? ?

## 2021-07-17 NOTE — Telephone Encounter (Signed)
Pt made aware samples available for pick up at the front desk. ? ?Brilinta 90 mg ?Qty: 4 bottles ?Lot # [id number] ?Exp: 07/09/23 ?

## 2021-07-25 NOTE — Progress Notes (Signed)
Patient ID: Susan Sexton, female   DOB: 1985-04-30, 36 y.o.   MRN: [id number]     Susan Sexton, is a 36 y.o. female  TDS:[id number]  WIO:[id number]  DOB - Mar 14, 1985  Chief Complaint  Patient presents with   recheck for std        Subjective:   Susan Sexton is a 36 y.o. female here today for a follow up After ED visit 07/10/2020 for CP with previous event.  Seen by cardiology 07/12/2021.  Patient had previous NSTEMI.  She has not had any further CP since that day.  She has not had to take any nitroglycerin.  She still needs to schedule f/up with lipid clinic.  No SOB.  She is smoking very little these days(~1/week).  She has a very strong FH with cardiac disease.  Dad died with MI at 80.  Dizziness resolved  She wants to get retested for trich before resuming SA.  No pelvic pain/vaginal d/c  From ED note: Susan Sexton is a 36 y.o. female.  She has a prior history of an NSTEMI and a cardiac stent.  She is here with a complaint of a few days of intermittent chest pain.  She describes it a pressure that last for 1 to 2 minutes at a time.  She attributes it to stress as she said she has been working a lot and having a lot of arguments.  She is a smoker.  She has not tried any nitroglycerin for it.  Not associated with any shortness of breath although did feel little dizzy.  No diaphoresis.   The history is provided by the patient.  Chest Pain Pain location:  Substernal area Pain quality: pressure   Pain radiates to:  Does not radiate Pain severity:  Mild Onset quality:  Gradual Duration:  2 days Timing:  Intermittent Progression:  Unchanged Chronicity:  New Context: stress   Relieved by:  None tried Worsened by:  Nothing Ineffective treatments:  None tried Associated symptoms: dizziness   Associated symptoms: no abdominal pain, no back pain, no fever, no nausea, no shortness of breath, no syncope and no vomiting    This patient complains of chest pain dizziness; this involves an  extensive number of treatment Options and is a complaint that carries with it a high risk of complications and morbidity. The differential includes ACS, GERD, musculoskeletal, anxiety, pneumonia, pneumothorax, vascular   I ordered, reviewed and interpreted labs, which included CBC with normal white count normal hemoglobin, chemistries normal, troponins flat I ordered medication oral aspirin and reviewed PMP when indicated. I ordered imaging studies which included chest x-ray and I independently    visualized and interpreted imaging which showed no acute findings Previous records obtained and reviewed in epic including prior cardiology notes Cardiac monitoring reviewed, patient in normal sinus rhythm Social determinants considered, no significant barriers Critical Interventions: None   After the interventions stated above, I reevaluated the patient and found patient's chest pain to be improved Admission and further testing considered, no indications for admission at this time.  She is comfortable plan for outpatient follow-up with her cardiology team.  Return instructions discussed  From cardiology note 07/12/2021: 1. CAD in native artery   2. Hyperlipidemia, unspecified hyperlipidemia type   3. Lightheadedness   4. Tobacco use     CAD: Presented with NSTEMI 05/04/2020.  Echocardiogram was done on 05/05/2020 which showed normal biventricular function, no significant valvular disease.  LHC on 05/05/2020 showed 99% proximal RCA stenosis, 50%  proximal to mid RCA, 20% distal RCA, 40% proximal to mid LCx, 80% proximal to mid LAD.  RCA disease was treated with DES.  Staged PCI to the LAD was done on 05/23/2020.  Underwent DES x2 to proximal/mid LAD.  This was complicated by likely guide catheter dissection of the left main, which was treated with DES. -Continue aspirin 81 mg daily, ticagrelor 90 mg twice daily.  Given extensive stenting, will continue DAPT beyond 1 year -Continue atorvastatin/Zetia,  referred to lipid clinic for PCSK9 inhibitor -Continue Toprol-XL 12.5 mg daily -Her current chest pain sounds noncardiac, as reports pain lasting for few seconds and resolving.  Denies any exertional chest pain continue to monitor   Hyperlipidemia: Suspect FH given LDL 282 in 01/2020 and family history including father had CABG in 30s.  Was started on atorvastatin 20 mg daily with improvement in LDL to 171 on 2/25.  Atorvastatin dose increased to 80 mg daily at that time.  LDL 169 on 07/10/2020, referred to lipid clinic for PCSK9 inhibitor, started on Repatha but was not able to afford.  She reports she now has insurance with her new job, will refer back to pharmacy lipid clinic to restart Repatha   Lightheadedness: Suspect due to medication use in setting of soft blood pressures.  Discontinued losartan.  Continued low-dose metoprolol as above, she reports has improved   Tobacco use: Smoked 1 pack/day x 15 years.  She reports she has quit smoking.  Congratulated patient on quitting and encouraged continued cessation   RTC in 3 months Your next appointment:   1st available with pharmD lipid clinic 3 months with NP/PA 6 months with Dr. Bjorn Pippin No problems updated.  ALLERGIES: No Known Allergies  PAST MEDICAL HISTORY: Past Medical History:  Diagnosis Date   Asthma    Coronary artery disease    High cholesterol    Hyperlipidemia LDL goal <100 05/04/2020   NSTEMI (non-ST elevated myocardial infarction) [facility])     MEDICATIONS AT HOME: Prior to Admission medications   Medication Sig Start Date End Date Taking? Authorizing Provider  acetaminophen (TYLENOL) 500 MG tablet Take 500-1,000 mg by mouth every 6 (six) hours as needed (for pain.).   Yes [provider]  albuterol (VENTOLIN HFA) 108 (90 Base) MCG/ACT inhaler Inhale 2 puffs into the lungs every 6 (six) hours as needed for wheezing or shortness of breath. NEEDS PASS 07/24/20  Yes Claiborne Rigg, NP  aspirin 81 MG chewable  tablet Chew 1 tablet (81 mg total) by mouth daily. 04/16/21  Yes Little Ishikawa, MD  atorvastatin (LIPITOR) 80 MG tablet Take 1 tablet (80 mg total) by mouth daily. 07/12/21  Yes Little Ishikawa, MD  ezetimibe (ZETIA) 10 MG tablet Take 1 tablet (10 mg total) by mouth daily. 07/12/21  Yes Little Ishikawa, MD  metoprolol succinate (TOPROL-XL) 25 MG 24 hr tablet Take 1/2 tablet (12.5 mg total) by mouth daily. 07/12/21  Yes Little Ishikawa, MD  Multiple Vitamins-Minerals (MULTI ADULT GUMMIES PO) Take 1 tablet by mouth daily.   Yes [provider]  nitroGLYCERIN (NITROSTAT) 0.4 MG SL tablet Place 1 tablet (0.4 mg total) under the tongue every 5 (five) minutes x 3 doses as needed for chest pain. 05/06/20  Yes Corrin Parker, PA-C  ticagrelor (BRILINTA) 90 MG TABS tablet Take 1 tablet (90 mg total) by mouth 2 (two) times daily. 07/12/21  Yes Little Ishikawa, MD    ROS: Neg HEENT Neg resp Neg cardiac Neg GI  Neg GU Neg MS Neg psych Neg neuro  Objective:   Vitals:   07/26/21 1016  BP: 123/78  Pulse: 74  Temp: 97.9 F (36.6 C)  TempSrc: Oral  SpO2: 99%  Weight: 163 lb (73.9 kg)  Height: 5\' 8"  (1.727 m)   Exam General appearance : Awake, alert, not in any distress. Speech Clear. Not toxic looking HEENT: Atraumatic and Normocephalic Neck: Supple, no JVD. No cervical lymphadenopathy.  Chest: Good air entry bilaterally, CTAB.  No rales/rhonchi/wheezing CVS: S1 S2 regular, no murmurs.  Extremities: B/L Lower Ext shows no edema, both legs are warm to touch Neurology: Awake alert, and oriented X 3, CN II-XII intact, Non focal Skin: No Rash  Data Review No results found for: HGBA1C  Assessment & Plan   1. H/O trichomonal vaginitis-1 month ago and treated Safe sex practices encouraged - Cervicovaginal ancillary only  2. Chest pain, unspecified type Resolved-follows with cardiology. Make appt with lipid clinic.  Keep 3 and 6 month appts with  cardiology.  Continue to stop smoking and any other modifiable factors-diet/exercise, etc  3. Encounter for examination following treatment at hospital    Return in about 4 months (around 11/26/2021) for PCP-chronic conditions.  The patient was given clear instructions to go to ER or return to medical center if symptoms don't improve, worsen or new problems develop. The patient verbalized understanding. The patient was told to call to get lab results if they haven't heard anything in the next week.      Georgian CoAngela Kiyani Jernigan, PA-C [facility] [place], [place] [phone]   07/26/2021, 11:09 AM

## 2021-07-26 ENCOUNTER — Encounter: Payer: Self-pay | Admitting: Physician Assistant

## 2021-07-26 ENCOUNTER — Other Ambulatory Visit (HOSPITAL_COMMUNITY)
Admission: RE | Admit: 2021-07-26 | Discharge: 2021-07-26 | Disposition: A | Discharge status: Home / Self Care | Payer: Self-pay | Source: Ambulatory Visit | Attending: Physician Assistant | Admitting: Physician Assistant

## 2021-07-26 ENCOUNTER — Ambulatory Visit: Payer: Self-pay | Attending: Physician Assistant | Admitting: Physician Assistant

## 2021-07-26 ENCOUNTER — Encounter: Payer: Self-pay | Admitting: Nurse Practitioner

## 2021-07-26 VITALS — BP 123/78 | HR 74 | Temp 97.9°F | Ht 68.0 in | Wt 163.0 lb

## 2021-07-26 DIAGNOSIS — Z8619 Personal history of other infectious and parasitic diseases: Secondary | ICD-10-CM

## 2021-07-26 DIAGNOSIS — R079 Chest pain, unspecified: Secondary | ICD-10-CM

## 2021-07-26 DIAGNOSIS — Z09 Encounter for follow-up examination after completed treatment for conditions other than malignant neoplasm: Secondary | ICD-10-CM

## 2021-07-26 NOTE — Patient Instructions (Signed)
Call the cardiologist and ask them about the "appointment with the lipid clinic" that was printed on your AVS on 07/12/2021

## 2021-07-27 LAB — CERVICOVAGINAL ANCILLARY ONLY
Bacterial Vaginitis (gardnerella): NEGATIVE
Candida Glabrata: NEGATIVE
Candida Vaginitis: NEGATIVE
Chlamydia: NEGATIVE
Comment: NEGATIVE
Comment: NEGATIVE
Comment: NEGATIVE
Comment: NEGATIVE
Comment: NEGATIVE
Comment: NORMAL
Neisseria Gonorrhea: NEGATIVE
Trichomonas: NEGATIVE

## 2021-08-02 ENCOUNTER — Telehealth: Payer: Self-pay

## 2021-08-02 NOTE — Telephone Encounter (Signed)
-----   Message from [place], [place] sent at 08/01/2021  4:25 PM EDT ----- Regarding: obtain new insurance Call the pharmacy and obtain the pt's new insurance information to attempt pa for repatha as asked by pharmd pt got new job so should have new insurance

## 2021-08-02 NOTE — Telephone Encounter (Signed)
Called to see if the pt had obtained new insurance information and they stated that they haven't as of yet but will call us back when they do so we can process a pa for repatha and see if we can try to get them on the medication as requested by the pharmacist. I will await call back from pt as she requested.

## 2021-08-14 ENCOUNTER — Telehealth: Payer: Self-pay

## 2021-08-14 ENCOUNTER — Other Ambulatory Visit: Payer: Self-pay

## 2021-08-14 MED ORDER — PRALUENT 75 MG/ML ~~LOC~~ SOAJ
75.0000 mg | SUBCUTANEOUS | 11 refills | Status: DC
Start: 2021-08-14 — End: 2021-09-20

## 2021-08-14 NOTE — Telephone Encounter (Signed)
Sent to plan for praluent 75mg  q2w

## 2021-08-14 NOTE — Telephone Encounter (Signed)
75mg  sq q 14 days

## 2021-08-14 NOTE — Telephone Encounter (Signed)
Susan Sexton (Key: [id number]) Praluent 75MG /ML auto-injectors Status: Question RequestCreated: June 6th, 2023 Awaiting ?'s

## 2021-08-14 NOTE — Telephone Encounter (Signed)
CALLED AND INFORMED THE PT: Praluent 75 mg approved along with copay card:  COMPLETE FASTING LABS POST 4TH DOSE

## 2021-08-14 NOTE — Addendum Note (Signed)
Addended by: Allean Found on: 08/14/2021 01:16 PM   Modules accepted: Orders

## 2021-08-14 NOTE — Telephone Encounter (Signed)
OK for patient to have 1 month of Praluent and then switch back to Repatha

## 2021-08-14 NOTE — Telephone Encounter (Signed)
Called the pharmacy and they stated that the insurance information is as follows: Id: [id number] MCN:[id number] When I tried to do a pa for the plan they stated that praluent is the preferred product for now however they will be changing that to repatha on 09/08/21. I will route to dr. Delma Officer to see what he wants to do. What had happened was the pt changed jobs and was waiting for insurance to kick in. They never called back so I decided to call and obtain the information from the pharmacy. [email or web address] stated: The preferred product for your patient's health plan is Praluent. Can the patient's treatment be switched to the preferred product?  Yes - Praluent. Please submit a new request for Praluent.  No- Continue request for non-preferred product.

## 2021-08-20 ENCOUNTER — Telehealth: Payer: Self-pay | Admitting: Cardiology

## 2021-08-20 ENCOUNTER — Other Ambulatory Visit: Payer: Self-pay

## 2021-08-20 NOTE — Telephone Encounter (Signed)
Patient calling the office for samples of medication:   1.  What medication and dosage are you requesting samples for? ticagrelor (BRILINTA) 90 MG TABS tablet  2.  Are you currently out of this medication?  Yes - Pt states that she has been without medication for 2 days. Please advise

## 2021-08-21 NOTE — Telephone Encounter (Signed)
Medication samples have been provided to the patient.  Drug name: Brilinta 90mg  Qty: 5 bottles LOT: Exp.Date: 07/09/23  Samples left at front desk for patient pick-up. Patient notified.  Spoke with pt, who states that she has just started a new job and her insurance begins on July 1. Pt states she just needs enough to get her to then and she will be able to fill that prescription. Pt also mentions that she is a week behind on taking her cholesterol shot, per chart Pharmacy [facility] was able to get her approved for Praluent and a copay card. Will provide this information with samples left at front desk. Pt verbalizes understanding.

## 2021-08-23 ENCOUNTER — Encounter: Payer: Self-pay | Admitting: Cardiology

## 2021-08-23 ENCOUNTER — Encounter: Payer: Self-pay | Admitting: Nurse Practitioner

## 2021-08-27 ENCOUNTER — Other Ambulatory Visit: Payer: Self-pay

## 2021-09-12 ENCOUNTER — Other Ambulatory Visit: Payer: Self-pay

## 2021-09-12 ENCOUNTER — Telehealth: Payer: Self-pay | Admitting: Cardiology

## 2021-09-12 NOTE — Telephone Encounter (Signed)
*  STAT* If patient is at the pharmacy, call can be transferred to refill team.   1. Which medications need to be refilled? (please list name of each medication and dose if known) Repatha  2. Which pharmacy/location (including street and city if local pharmacy) is medication to be sent to? [id number] - [place], [place] - [address]  3. Do they need a 30 day or 90 day supply? 30 day  Patient states she was approved for the repatha.  She wants to know if she has a copay for it.

## 2021-09-19 ENCOUNTER — Telehealth: Payer: Self-pay | Admitting: Cardiology

## 2021-09-19 NOTE — Telephone Encounter (Signed)
   Pre-operative Risk Assessment    Patient Name: Susan Sexton  DOB: 1985-11-22 MRN: [id number]     Request for Surgical Clearance    Procedure:  Dental Extraction - Amount of Teeth to be Pulled:  fillings and srp, 2 tooth extraction  Date of Surgery:  Clearance 09/20/21  7:00 am                           Surgeon:  Dr. Eather Colas Surgeon's Group or Practice Name:  [facility] Phone number:  [phone] Fax number:  [phone]   Type of Clearance Requested:   - Medical  - Pharmacy blood thinner, need cardiology recommendation   Type of Anesthesia:  None    Additional requests/questions:  any contradictions of extractions at this time, any premedication, any recommendations at this time  Signed, Damaris Schooner   09/19/2021, 3:10 PM

## 2021-09-19 NOTE — Telephone Encounter (Signed)
Patient states she is scheduled for oral surgery tomorrow, 7/13, with [facility]. They faxed a clearance request to the office, but have not heard back. Patient would like to know if there are any updates on her clearance. Please advise. She also provided a phone number for [facility], [phone].

## 2021-09-19 NOTE — Telephone Encounter (Signed)
*  STAT* If patient is at the pharmacy, call can be transferred to refill team.   1. Which medications need to be refilled? (please list name of each medication and dose if known)  -  nitroGLYCERIN (NITROSTAT) 0.4 MG SL tablet - Repatha -  ticagrelor (BRILINTA) 90 MG TABS tablet  2. Which pharmacy/location (including street and city if local pharmacy) is medication to be sent to? [facility]  3. Do they need a 30 day or 90 day supply?  90 day supply + standard supply of nitro. Patient states it is out of date

## 2021-09-19 NOTE — Telephone Encounter (Signed)
I received call directly from operator regarding patient's procedure scheduled for 7/13.  Caller asked me to clarify in the event that dentist will proceed only with patient holding anti-platelet therapy. I advised that due to the length of the stenting in the patient's left main, LAD, and RCA that I will have to forward clearance to Dr. Bjorn Pippin for his agreement.      Primary Cardiologist: Little Ishikawa, MD  Chart reviewed as part of pre-operative protocol coverage. Simple dental extractions are considered low risk procedures per guidelines and generally do not require any specific cardiac clearance. It is also generally accepted that for simple extractions and dental cleanings, there is no need to interrupt blood thinner therapy.   SBE prophylaxis is not required for the patient.  I will route this recommendation to the requesting party via Epic fax function and remove from pre-op pool.  Levi Aland, NP-C    09/19/2021, 3:26 PM [facility] [address], [address] Office [phone] Fax [phone]

## 2021-09-19 NOTE — Telephone Encounter (Signed)
OK to hold ticagrelor for procedure as has been over 1 year since her PCI.  She should not hold her aspirin

## 2021-09-19 NOTE — Telephone Encounter (Signed)
   Primary Cardiologist: Little Ishikawa, MD  Chart reviewed as part of pre-operative protocol coverage. Given past medical history and time since last visit, based on ACC/AHA guidelines, Susan Sexton would be at acceptable risk for the planned procedure without further cardiovascular testing.   She may hold ticagrelor for dental extractions/procedures. She should not hold aspirin.   I will route this recommendation to the requesting party via Epic fax function and remove from pre-op pool.  Please call with questions.  Levi Aland, NP-C    09/19/2021, 4:18 PM [facility] [address] Office [phone] Fax [phone]

## 2021-09-20 ENCOUNTER — Other Ambulatory Visit: Payer: Self-pay

## 2021-09-20 ENCOUNTER — Telehealth: Payer: Self-pay | Admitting: Cardiology

## 2021-09-20 MED ORDER — PRALUENT 75 MG/ML ~~LOC~~ SOAJ
75.0000 mg | SUBCUTANEOUS | 11 refills | Status: DC
  Filled 2021-09-20 – 2022-01-05 (×3): qty 2, 28d supply, fill #0

## 2021-09-20 MED ORDER — NITROGLYCERIN 0.4 MG SL SUBL
0.4000 mg | SUBLINGUAL_TABLET | SUBLINGUAL | 2 refills | Status: AC | PRN
  Filled 2021-09-20 – 2021-09-27 (×2): qty 25, 25d supply, fill #0
  Filled 2022-01-05: qty 25, 7d supply, fill #0
  Filled 2022-02-20 (×2): qty 25, 8d supply, fill #0

## 2021-09-20 NOTE — Telephone Encounter (Signed)
I spoke to Susan Sexton and she informed me she needs her Repatha I informed her that I didn't see Repatha on her medication list but I did see Praluent. And she said she's never taken the Praluent. I also informed her I will send the request to our [facility]. I sent a refill for nitroGLYCERIN to the pharmacy and ticagrelor is already at the pharmacy and has not been picked up by patient yet.

## 2021-09-20 NOTE — Telephone Encounter (Signed)
Did not need this encounter °

## 2021-09-20 NOTE — Telephone Encounter (Signed)
The Anticoagulation Clinic is forwarding this message to the Pharmacist as they handle injectable cholesterol medication such as Repatha and Praluent.

## 2021-09-20 NOTE — Addendum Note (Signed)
Addended by: Gladys Gutman E on: 09/20/2021 03:22 PM   Modules accepted: Orders

## 2021-09-20 NOTE — Telephone Encounter (Signed)
Pt was to start Praluent last month per 6/6 phone note, unclear why she did not. Called [facility] where rx was sent last month, was advised that [facility] is not a contracted pharmacy with her insurance and cannot fill any meds. Praluent is the only  med they have on file, not sure why it was sent to them in the first place instead of [facility] where she has her other meds filled. Refill sent there, pt is aware.

## 2021-09-20 NOTE — Telephone Encounter (Signed)
Dental office calling stating they did not get our fax. I will re-fax to the number given today.

## 2021-09-21 ENCOUNTER — Other Ambulatory Visit: Payer: Self-pay

## 2021-09-27 ENCOUNTER — Other Ambulatory Visit: Payer: Self-pay

## 2021-10-01 ENCOUNTER — Other Ambulatory Visit (HOSPITAL_COMMUNITY): Payer: Self-pay

## 2021-10-01 ENCOUNTER — Other Ambulatory Visit: Payer: Self-pay

## 2021-10-02 ENCOUNTER — Telehealth: Payer: Self-pay | Admitting: Cardiology

## 2021-10-02 ENCOUNTER — Other Ambulatory Visit (HOSPITAL_COMMUNITY): Payer: Self-pay

## 2021-10-02 NOTE — Telephone Encounter (Signed)
Attempted to contact patient to discuss.  Left message with call back number.

## 2021-10-02 NOTE — Telephone Encounter (Signed)
Patient returned call.  Advised that even with her insurance, Marden Noble was $400 she is not able to continue this and has been out of medications for a few days.   I did advise we had samples for her to come pick up.  Also supplied her with patient assistance if this was an option.we may need to call the pharmacy and find out what is going on.   Patient verbalized understanding.

## 2021-10-02 NOTE — Telephone Encounter (Signed)
Pt c/o medication issue:  1. Name of Medication: ticagrelor (BRILINTA) 90 MG TABS tablet  2. How are you currently taking this medication (dosage and times per day)? Take 1 tablet (90 mg total) by mouth 2 (two) times daily.  3. Are you having a reaction (difficulty breathing--STAT)? no  4. What is your medication issue? Patient is unable to to pay for medication. And patient is out of the medication. Please advise

## 2021-10-03 ENCOUNTER — Other Ambulatory Visit (HOSPITAL_COMMUNITY): Payer: Self-pay

## 2021-10-03 NOTE — Telephone Encounter (Signed)
Called pharmacy to clarify-  Brilinta [phone] [employer] + and patient choice co pay card  Covered but not paying for it, copay card not helping cost.   Pharmacy requested patient to bring insurance card to pharmacy.    Left message for patient to call back.

## 2021-10-03 NOTE — Telephone Encounter (Signed)
Left message to call back. Call goes directly to voicemail.

## 2021-10-03 NOTE — Telephone Encounter (Signed)
Pt is returning call. Requesting call back. Pt states that she has to go to the back door to get signal so please do not hang up if she does not answer right away.

## 2021-10-03 NOTE — Telephone Encounter (Signed)
Spoke to patient, she believes her insurance is BC[employer] and doesn't think [facility] has this information.  She has not received a card yet but does have the paperwork with this information.   Advised to provide this information to pharmacy asap.   Patient verbalized understanding.

## 2021-10-08 ENCOUNTER — Other Ambulatory Visit: Payer: Self-pay

## 2021-10-10 ENCOUNTER — Other Ambulatory Visit (HOSPITAL_COMMUNITY): Payer: Self-pay

## 2021-10-16 ENCOUNTER — Telehealth: Payer: Self-pay | Admitting: Cardiology

## 2021-10-16 NOTE — Telephone Encounter (Signed)
Spoke with pt regarding increased blood pressure readings. Pt states that she found out stressful news this morning regarding 2 family members (1 had a heart attack and another had a stroke) since then she has noticed some light sensitivity and some light headedness. Pt had her blood pressure taken while at work today: 10:42am: 135/91, 11:20am: 145/90, 12:30pm: 151/110. Pt states that she doesn't have any additional symptoms. Pt is taking most of the her medication as prescribed (inconsistent with cholesterol meds) and feels like she is hydrated and eating well. Pt does not have a blood pressure cuff of her own and she doesn't check her blood pressure on a regular basis. Suggested that pt get a blood pressure cuff and monitor her BP. Recommended that if her blood pressure log shows that her pressure is consistently elevated that she should call us back. Pt verbalizes understanding.

## 2021-10-16 NOTE — Telephone Encounter (Signed)
Pt c/o BP issue: STAT if pt c/o blurred vision, one-sided weakness or slurred speech  1. What are your last 5 BP readings? 135/91; 145/90; 151/110  2. Are you having any other symptoms (ex. Dizziness, headache, blurred vision, passed out)? Lightheaded, light sensitivity  3. What is your BP issue? Patient is experiencing high blood pressure

## 2021-10-17 NOTE — Progress Notes (Signed)
Cardiology Clinic Note   Patient Name: Susan Sexton Date of Encounter: 10/19/2021  Primary Care Provider:  Claiborne Rigg, NP Primary Cardiologist:  Little Ishikawa, MD  Patient Profile    Susan Sexton is a 36 y.o. female with a hx of CAD, hyperlipidemia, tobacco use who presents for follow-up.  She was admitted to [facility] on 05/04/2020 with NSTEMI.  She had presented to med [facility] ED with chest pain.  Found to have mildly elevated troponins (peak 156).  Despite her age, she was noted to have significant CAD risk factors as suspected FH given LDL to 32 and family history including father having CABG in his 54s.    Echocardiogram was done on 05/05/2020 which showed normal biventricular function, no significant valvular disease.  LHC on 05/05/2020 showed 99% proximal RCA stenosis, 50% proximal to mid RCA, 20% distal RCA, 40% proximal to mid LCx, 80% proximal to mid LAD.  RCA disease was treated with DES.  Staged PCI to the LAD was done on 05/23/2020.  Underwent DES x2 to proximal/mid LAD.  This was complicated by likely guide catheter dissection of the left main, which was treated with DES. She was to continue aspirin 81 mg daily, ticagrelor 90 mg twice daily, given extensive stenting, will continue DAPT beyond 1 year  She has been seen in the ED for recurrent chest pain and this was found to be related to stress. She is here for complaints of blurred vision with labile BP readings at home.  Past Medical History    Past Medical History:  Diagnosis Date   Asthma    Coronary artery disease    High cholesterol    Hyperlipidemia LDL goal <100 05/04/2020   NSTEMI (non-ST elevated myocardial infarction) [facility])    Past Surgical History:  Procedure Laterality Date   BREAST LUMPECTOMY Left 06/6/219   CARDIAC CATHETERIZATION     CORONARY STENT INTERVENTION N/A 05/05/2020   Procedure: CORONARY STENT INTERVENTION;  Surgeon: Iran Ouch, MD;  Location: [facility] INVASIVE CV LAB;   Service: Cardiovascular;  Laterality: N/A;   CORONARY STENT INTERVENTION N/A 05/23/2020   Procedure: CORONARY STENT INTERVENTION;  Surgeon: Corky Crafts, MD;  Location: [facility] INVASIVE CV LAB;  Service: Cardiovascular;  Laterality: N/A;   CORONARY STENT PLACEMENT  05/05/2020   INCISION AND DRAINAGE BREAST ABSCESS Left 09/2017   INTRAVASCULAR ULTRASOUND/IVUS N/A 05/23/2020   Procedure: Intravascular Ultrasound/IVUS;  Surgeon: Corky Crafts, MD;  Location: [facility] INVASIVE CV LAB;  Service: Cardiovascular;  Laterality: N/A;   LEFT HEART CATH AND CORONARY ANGIOGRAPHY N/A 05/05/2020   Procedure: LEFT HEART CATH AND CORONARY ANGIOGRAPHY;  Surgeon: Iran Ouch, MD;  Location: [facility] INVASIVE CV LAB;  Service: Cardiovascular;  Laterality: N/A;   LEFT HEART CATH AND CORONARY ANGIOGRAPHY N/A 05/23/2020   Procedure: LEFT HEART CATH AND CORONARY ANGIOGRAPHY;  Surgeon: Corky Crafts, MD;  Location: [facility] INVASIVE CV LAB;  Service: Cardiovascular;  Laterality: N/A;    Allergies  No Known Allergies  History of Present Illness  Susan Sexton is a very pleasant 36 year old female with extensive cardiac history, premature coronary artery disease with stenting of the proximal and mid LAD, and RCA.  Other history as described above.  She comes today with complaints of elevated blood pressure in the morning and headaches.  She is under a good deal of stress as she is having other family members with premature coronary artery disease, and CVA in 6s and 66s age group.  These  are cousins.  They are being followed by cardiologist as well.  This is extremely upsetting to her to see other family members going through similar circumstances.  Blood pressure does rise during these anxiety and stressful situations associated with her family.  She is medically compliant, but is noticing her blood pressure is really elevated in the morning along with a headache.  She denies dizziness, or palpitations.  She does feel  little chest pressure when she becomes anxious or worried about family members.  She does get a headache with elevated blood pressure.  She would also like to be seen by social worker concerning housing issues which she is facing at this time.  This also is increasing her anxiety.  She is followed by [facility] for prescriptions and labs.    Home Medications    Current Outpatient Medications  Medication Sig Dispense Refill   acetaminophen (TYLENOL) 500 MG tablet Take 500-1,000 mg by mouth every 6 (six) hours as needed (for pain.).     albuterol (VENTOLIN HFA) 108 (90 Base) MCG/ACT inhaler Inhale 2 puffs into the lungs every 6 (six) hours as needed for wheezing or shortness of breath. NEEDS PASS 18 g 1   Alirocumab (PRALUENT) 75 MG/ML SOAJ Inject 75 mg into the skin every 14 (fourteen) days. 2 mL 11   aspirin 81 MG chewable tablet Chew 1 tablet (81 mg total) by mouth daily.     atorvastatin (LIPITOR) 80 MG tablet Take 1 tablet (80 mg total) by mouth daily. 90 tablet 3   ezetimibe (ZETIA) 10 MG tablet Take 1 tablet (10 mg total) by mouth daily. 90 tablet 3   metoprolol succinate (TOPROL-XL) 25 MG 24 hr tablet Take 1/2 tablet (12.5 mg total) by mouth daily. (Patient taking differently: Take 12.5 mg by mouth daily. In The Evening) 45 tablet 3   Multiple Vitamins-Minerals (MULTI ADULT GUMMIES PO) Take 1 tablet by mouth daily.     nitroGLYCERIN (NITROSTAT) 0.4 MG SL tablet Place 1 tablet (0.4 mg total) under the tongue every 5 (five) minutes x 3 doses as needed for chest pain. 25 tablet 2   ticagrelor (BRILINTA) 90 MG TABS tablet Take 1 tablet (90 mg total) by mouth 2 (two) times daily. 180 tablet 3   No current facility-administered medications for this visit.     Family History    Family History  Problem Relation Age of Onset   Hypertension Mother    Heart attack Father 41       Had CABG, but died at age 53 after trauma   She indicated that her mother is alive. She  indicated that her father is deceased.  Social History    Social History   Socioeconomic History   Marital status: Single    Spouse name: Not on file   Number of children: Not on file   Years of education: 11   Highest education level: 11th grade  Occupational History   Not on file  Tobacco Use   Smoking status: Former    Packs/day: 0.50    Types: Cigarettes    Quit date: 05/06/2020    Years since quitting: 1.4   Smokeless tobacco: Never   Tobacco comments:    Nisa was given 1-800-quit-now. Connor says that she is smoking 1 cigarette a week  Vaping Use   Vaping Use: Former   Quit date: 05/06/2020  Substance and Sexual Activity   Alcohol use: No   Drug use: No   Sexual  activity: Yes    Birth control/protection: None  Other Topics Concern   Not on file  Social History Narrative   Not on file   Social Determinants of Health   Financial Resource Strain: High Risk (06/06/2020)   Overall Financial Resource Strain (CARDIA)    Difficulty of Paying Living Expenses: Very hard  Food Insecurity: Food Insecurity Present (06/06/2020)   Hunger Vital Sign    Worried About Running Out of Food in the Last Year: Sometimes true    Ran Out of Food in the Last Year: Sometimes true  Transportation Needs: No Transportation Needs (06/06/2020)   PRAPARE - Administrator, Civil Service (Medical): No    Lack of Transportation (Non-Medical): No  Physical Activity: Not on file  Stress: Stress Concern Present (06/06/2020)   [employer] of Occupational Health - Occupational Stress Questionnaire    Feeling of Stress : To some extent  Social Connections: Not on file  Intimate Partner Violence: Not on file     Review of Systems    General:  No chills, fever, night sweats or weight changes.  Cardiovascular:  No chest pain, dyspnea on exertion, edema, orthopnea, palpitations, paroxysmal nocturnal dyspnea. Dermatological: No rash, lesions/masses Respiratory: No cough,  dyspnea Urologic: No hematuria, dysuria Abdominal:   No nausea, vomiting, diarrhea, bright red blood per rectum, melena, or hematemesis Neurologic:  No visual changes, wkns, changes in mental status.  Complaints of headaches with elevated blood pressure especially in the morning. All other systems reviewed and are otherwise negative except as noted above.     Physical Exam    VS:  BP 102/62   Pulse 88   Ht 5\' 7"  (1.702 m)   Wt 154 lb 12.8 oz (70.2 kg)   SpO2 97%   BMI 24.25 kg/m  , BMI Body mass index is 24.25 kg/m.     GEN: Well nourished, well developed, in no acute distress. HEENT: normal. Neck: Supple, no JVD, carotid bruits, or masses. Cardiac: RRR, no murmurs, rubs, or gallops. No clubbing, cyanosis, edema.  Radials/DP/PT 2+ and equal bilaterally.  Respiratory:  Respirations regular and unlabored, clear to auscultation bilaterally. GI: Soft, nontender, nondistended, BS + x 4. MS: no deformity or atrophy. Skin: warm and dry, no rash. Neuro:  Strength and sensation are intact. Psych: Normal affect.  Tearful when speaking of family members illnesses.  Accessory Clinical Findings    ECG personally reviewed by me today- Not done today.  Lab Results  Component Value Date   WBC 9.1 07/10/2021   HGB 12.7 07/10/2021   HCT 37.4 07/10/2021   MCV 92.3 07/10/2021   PLT 249 07/10/2021   Lab Results  Component Value Date   CREATININE 0.76 07/10/2021   BUN 10 07/10/2021   NA 137 07/10/2021   K 3.7 07/10/2021   CL 105 07/10/2021   CO2 25 07/10/2021   Lab Results  Component Value Date   ALT 13 03/15/2021   AST 17 03/15/2021   ALKPHOS 113 03/15/2021   BILITOT 0.3 03/15/2021   Lab Results  Component Value Date   CHOL 212 (H) 06/14/2021   HDL 48 06/14/2021   LDLCALC 157 (H) 06/14/2021   TRIG 39 06/14/2021   CHOLHDL 4.4 06/14/2021    No results found for: "HGBA1C"  Review of Prior Studies: LHC 05/05/2020 Prox RCA lesion is 99% stenosed. Prox RCA to Mid RCA lesion  is 50% stenosed. Dist RCA lesion is 20% stenosed. Prox Cx to Mid Cx lesion is  40% stenosed. Prox LAD to Mid LAD lesion is 80% stenosed. A drug-eluting stent was successfully placed using a STENT RESOLUTE ONYX [id number]. Post intervention, there is a 0% residual stenosis. Post intervention, there is a 0% residual stenosis.   1.  Significant two-vessel coronary artery disease.  The culprit is 99% stenosis in the proximal RCA.  In addition, the mid RCA has moderate diffuse disease.  The LAD has 80% proximal/mid LAD at the bifurcation of a large diagonal branch. 2.  Left ventricular angiography was not performed.  EF was normal by echo.  Mildly elevated left ventricular end-diastolic pressure.\ 3.  Successful angioplasty and drug-eluting stent placement to the RCA.  I elected to use a long stent to cover the moderate disease in the mid vessel in addition to the critical stenosis in the proximal segment.  Coronary Stent Intervention 05/23/2020 Prox Cx to Mid Cx lesion is 40% stenosed. Dist RCA lesion is 20% stenosed. Previously placed Prox RCA drug eluting stent is widely patent. Previously placed Prox RCA to Mid RCA drug eluting stent is widely patent. Prox LAD to Mid LAD lesion is 80% stenosed. A drug-eluting stent was successfully placed using a STENT RESOLUTE ONYX 3.5X30, postdilated to 3.75 mm and optimized with intravascular ultrasound. Post intervention, there is a 0% residual stenosis. Mid LAD lesion is 75% stenosed in a very tortuous vessel, at the distal edge of the prior stent, likely representing edge dissection. A drug-eluting stent was successfully placed using a STENT RESOLUTE ONYX 3.5X8., Postdilated to 3.75 mm and optimized with intravascular ultrasound. Post intervention, there is a 0% residual stenosis. Mid LM lesion is 60% stenosed, likely guide catheter dissection. A drug-eluting stent was successfully placed using a STENT RESOLUTE ONYX 3.5X12, postdilated to greater than 4 mm in  diameter and optimized with intravascular ultrasound. Post intervention, there is a 0% residual stenosis. LV end diastolic pressure is normal. There is no aortic valve stenosis.   Continue dual antiplatelet therapy for 12 months.  Given the length of stented segments in both her LAD, left main and RCA, would continue clopidogrel monotherapy after the 1 year of dual antiplatelet therapy.    Echocardiogram 05/05/2020 1. Left ventricular ejection fraction, by estimation, is 60 to 65%. The  left ventricle has normal function. The left ventricle has no regional  wall motion abnormalities. There is mild left ventricular hypertrophy.  Left ventricular diastolic parameters  were normal.   2. Right ventricular systolic function is normal. The right ventricular  size is normal. There is normal pulmonary artery systolic pressure. The  estimated right ventricular systolic pressure is 35.8 mmHg.   3. Right atrial size was mildly dilated.   4. The mitral valve is normal in structure. No evidence of mitral valve  regurgitation. No evidence of mitral stenosis.   5. The aortic valve is normal in structure. Aortic valve regurgitation is  not visualized. No aortic stenosis is present.   6. The inferior vena cava is dilated in size with <50% respiratory  variability, suggesting right atrial pressure of 15 mmHg.   Assessment & Plan   1.  Premature coronary artery disease: NSTEMI at age 16, DES to the proximal and mid LAD and RCA 2022.  She denies any recurrent discomfort.  She does get some chest pressure with stress.  She is medically compliant.  Continue secondary management with blood pressure control, control of cholesterol, and lifestyle management concerning low-cholesterol diet and exercise.  No need for any ischemic testing at this time.  2. Hypertension: She has noticed that her blood pressure is elevated in the morning.  She is on metoprolol 12.5 mg in the a.m.  I am going to have her take it in the  p.m. check her blood pressure in the morning and in the afternoon we may end up doing metoprolol twice daily for a few days to see how she does and she is to keep track of blood pressure for this reason.  She is given a blood pressure machine did take her blood pressure at home.  She is advised to record it daily and keep a record.  I am going to see her back in a month to evaluate her response to the medication and whether or not she is tolerating the twice daily metoprolol.  3. Hypercholesterolemia: Continue statin therapy. Goal of LDL < 70. No recent labs are in Epic. Will order fasting lipids and LFT's on next office visit.   4.  Situational stress and anxiety: She is very concerned about family members who are now presenting with coronary artery disease and CVAs in their 3820s and 30s.  Some of this is causing her significant amount of stress.  She is advised to do her best to care for herself so that her stress level remains lower despite her family health concerns.  May need to consider psychological counseling to help her with skills and tools to lower her stress level.  Can talk to primary care about need for antidepressant or referral to counseling.  5.  Social determinants of health: Deficiency concerning housing.  I have referred her social worker in our office to assist with resources.  Current medicines are reviewed at length with the patient today.  I have spent 25 min's  dedicated to the care of this patient on the date of this encounter to include pre-visit review of records, assessment, management and diagnostic testing,with shared decision making.  Signed, Bettey MareKathryn M. Liborio NixonLawrence DNP, ANP, AACC   10/19/2021 2:46 PM      Office [phone] Fax [phone]  Notice: This dictation was prepared with Dragon dictation along with smaller phrase technology. Any transcriptional errors that result from this process are unintentional and may not be corrected upon review.

## 2021-10-17 NOTE — Telephone Encounter (Signed)
Patient called with concerns of elevated blood pressure.  Patient is essentially symptomatic from cardiovascular perspective at this time, no chest pain, shortness of breath, palpitations, presyncope or syncope.  Patient has any weight gain.  Patient reports some eye discomfort.  She measured her blood pressure earlier today and it was around 130/90, she was told her heart rate was fast.  However no apparent failure was provided.  Patient denies any palpitations.  Patient reports compliance with medical regimen including statin, beta-blocker, DAPT.  Patient has a scheduled appoint with cardiology on Friday. Patient was encouraged and educated regarding blood pressure measurements.  Reading materials provided.  Patient advised to continue with home med regimen and reach out to [place] if she has any questions or concerns.

## 2021-10-19 ENCOUNTER — Telehealth: Payer: Self-pay | Admitting: Licensed Clinical Social Worker

## 2021-10-19 ENCOUNTER — Ambulatory Visit (INDEPENDENT_AMBULATORY_CARE_PROVIDER_SITE_OTHER): Payer: Self-pay | Admitting: Adult Health

## 2021-10-19 ENCOUNTER — Encounter: Payer: Self-pay | Admitting: Adult Health

## 2021-10-19 VITALS — BP 102/62 | HR 88 | Ht 67.0 in | Wt 154.8 lb

## 2021-10-19 DIAGNOSIS — E7800 Pure hypercholesterolemia, unspecified: Secondary | ICD-10-CM

## 2021-10-19 DIAGNOSIS — F418 Other specified anxiety disorders: Secondary | ICD-10-CM

## 2021-10-19 DIAGNOSIS — I10 Essential (primary) hypertension: Secondary | ICD-10-CM

## 2021-10-19 DIAGNOSIS — Z955 Presence of coronary angioplasty implant and graft: Secondary | ICD-10-CM

## 2021-10-19 DIAGNOSIS — I2510 Atherosclerotic heart disease of native coronary artery without angina pectoris: Secondary | ICD-10-CM

## 2021-10-19 NOTE — Progress Notes (Unsigned)
Heart and Vascular Care Navigation  10/19/2021  TOREE EDLING [birth year] [id number]  Reason for Referral: housing issues Patient is participating in a Managed Medicaid Plan: No, commercial plan only Engaged with patient by telephone for follow up visit for Heart and Vascular Care Coordination.                                                                                                   Assessment:                  LCSW able to reach pt after her appt at [facility] at [phone]. Re-introduced self, role, reason for call. I have interacted multiple times with this pt related to housing (previously was residing with friends or in her car - she utilizes her mothers address for mail but denies being able to stay there), finances, insurance etc. She currently is working, has income and insurance (BCBS- asked pt to call them for a card/bring in the letter of benefits to next appt).   At this time pt is residing with friends in an apartment but their lease is up in October and she will be not be able to go with them to new apartment. Pt has recurrent issues with finding a rental due to hx of not returning a key fob to a previous apartment. LCSW advised that I can send a few resources but cautioned we do not work with any specific landlords and pt would still likely have to do some research for rentals in her budget etc. I did offer also to refer pt to [facility] for Housing and [employer] Studies as their staff may be able to assist with information about how to manage the rental hx issues. Pt agreeable to this plan.                          HRT/VAS Care Coordination     Patients Home Cardiology Office [facility]   Outpatient Care Team Social Worker   Social Worker Name: Octavio Graves LCSW [facility] [phone]   Living arrangements for the past 2 months Apartment   Lives with: Friends   Patient Current Optometrist   Patient Has Concern With  Paying Medical Bills No   Patient Concerns With Medical Bills denied for [employer] Referrals: had been mailed cone financial assistance and orange card application   Does Patient Have Prescription Coverage? Yes   Patient Prescription Assistance Programs [place] [facility]; Patient Assistance Programs   [place] [facility] Medications mailed application 3/29   Patient Assistance Programs Medications pt will need [place]il[place] PAP   Home Assistive Devices/Equipment None       Social History:  SDOH Screenings   Alcohol Screen: Not on file  Depression (PHQ2-9): Medium Risk (07/26/2021)   Depression (PHQ2-9)    PHQ-2 Score: 8  Financial Resource Strain: Medium Risk (10/19/2021)   Overall Financial Resource Strain (CARDIA)    Difficulty of Paying Living Expenses: Somewhat hard  Food Insecurity: No Food Insecurity (10/19/2021)   Hunger Vital Sign    Worried About Running Out of Food in the Last Year: Never true    Ran Out of Food in the Last Year: Never true  Housing: High Risk (10/19/2021)   Housing    Last Housing Risk Score: 2  Physical Activity: Not on file  Social Connections: Not on file  Stress: Stress Concern Present (10/19/2021)   [employer] of Occupational Health - Occupational Stress Questionnaire    Feeling of Stress : Rather much  Tobacco Use: Medium Risk (10/19/2021)   Patient History    Smoking Tobacco Use: Former    Smokeless Tobacco Use: Never    Passive Exposure: Not on file  Transportation Needs: No Transportation Needs (10/19/2021)   PRAPARE - Administrator, Civil Service (Medical): No    Lack of Transportation (Non-Medical): No    SDOH Interventions: Financial Resources:  Financial Strain Interventions: Other (Comment) [facility] for Housing and Community Studies; Housing list from [employer]) DSS for financial assistance  Food Insecurity:  Food Insecurity  Interventions: Intervention Not Indicated  Housing Insecurity:  Housing Interventions: [id number] Referral, Other (Comment) (housing resources; [employer] for Housing and [employer] Studies)  Transportation:   Transportation Interventions: Intervention Not Indicated    Other Care Navigation Interventions:     Provided Pharmacy assistance resources  Now has Nurse, learning disability   Follow-up plan:   LCSW will send pt an email with resources/make referral to [employer] for Housing. I remain available. Pt has been encouraged to bring in her current coverage/call and let billing know.

## 2021-10-19 NOTE — Patient Instructions (Signed)
Medication Instructions:  No Changes *If you need a refill on your cardiac medications before your next appointment, please call your pharmacy*   Lab Work: No Labs If you have labs (blood work) drawn today and your tests are completely normal, you will receive your results only by: MyChart Message (if you have MyChart) OR A paper copy in the mail If you have any lab test that is abnormal or we need to change your treatment, we will call you to review the results.   Testing/Procedures: No Testing    Follow-Up: At [facility], you and your health needs are our priority.  As part of our continuing mission to provide you with exceptional heart care, we have created designated Provider Care Teams.  These Care Teams include your primary Cardiologist (physician) and Advanced Practice Providers (APPs -  Physician Assistants and Nurse Practitioners) who all work together to provide you with the care you need, when you need it.  We recommend signing up for the patient portal called "MyChart".  Sign up information is provided on this After Visit Summary.  MyChart is used to connect with patients for Virtual Visits (Telemedicine).  Patients are able to view lab/test results, encounter notes, upcoming appointments, etc.  Non-urgent messages can be sent to your provider as well.   To learn more about what you can do with MyChart, go to [email or web address].    Your next appointment:   1 month(s)  The format for your next appointment:   In Person  Provider:   Joni Reining, DNP, ANP    Then, Little Ishikawa, MD will plan to see you again in 6 month(s).

## 2021-10-22 ENCOUNTER — Telehealth: Payer: Self-pay | Admitting: Licensed Clinical Social Worker

## 2021-10-22 NOTE — Telephone Encounter (Signed)
H&V Care Navigation CSW Progress Note  Clinical Social Worker  was contacted by pt this morning  to f/u on housing resources. LCSW had not yet sent these, I have now emailed pt at [email or web address]. the following resources were attached to the email (text below): 2022 Housing Runner, broadcasting/film/video, Legal Aid Housing Helpline, [facility] flyer, Landlord List Section [place]. I also have referred pt to team at [facility] for Housing and [employer] Resources. Remain available as needed.   Hi [name], Attached are multiple resources for housing. I have sent your number and name to the [facility] for Housing and Community Studies and one of their team members should be in touch- they have a more comprehensive list of landlords that individuals have worked with, they also may be able to assist you with more information about what to do regarding the history with the key fob. If they cannot then I have attached the Legal Aid Housing Helpline flyer.   There is also a website with a search engine for local properties here: [email or web address]- you can enter your preferred max rental price etc and search surrounding counties if needed.   If you find yourself without any housing you can reach out to the Coordinated Entry team by emailing [email or web address] or call [phone] to complete an intake and be considered for shelter waitlists and case management services.  I apologize again for the delay! Rutherford Nail, social work  Octavio Graves, MSW, [email or web address] Work Cell: [phone] Clinical Social Worker II [place] Heart and Vascular Care Navigation    Patient is participating in a Managed Medicaid Plan:  No, commercial insurance only.   SDOH Screenings   Alcohol Screen: Not on file  Depression (PHQ2-9): Medium Risk (07/26/2021)   Depression (PHQ2-9)    PHQ-2 Score: 8  Financial Resource Strain: Medium Risk (10/19/2021)    Overall Financial Resource Strain (CARDIA)    Difficulty of Paying Living Expenses: Somewhat hard  Food Insecurity: No Food Insecurity (10/19/2021)   Hunger Vital Sign    Worried About Running Out of Food in the Last Year: Never true    Ran Out of Food in the Last Year: Never true  Housing: High Risk (10/19/2021)   Housing    Last Housing Risk Score: 2  Physical Activity: Not on file  Social Connections: Not on file  Stress: Stress Concern Present (10/19/2021)   [employer] of Occupational Health - Occupational Stress Questionnaire    Feeling of Stress : Rather much  Tobacco Use: Medium Risk (10/19/2021)   Patient History    Smoking Tobacco Use: Former    Smokeless Tobacco Use: Never    Passive Exposure: Not on file  Transportation Needs: No Transportation Needs (10/19/2021)   PRAPARE - Transportation    Lack of Transportation (Medical): No    Lack of Transportation (Non-Medical): No    Octavio Graves, MSW, LCSW Clinical Social Worker II [facility] Heart/Vascular Care Navigation  [phone]- work cell phone (preferred) [phone]- desk phone

## 2021-10-31 ENCOUNTER — Ambulatory Visit: Payer: Self-pay | Admitting: Nurse Practitioner

## 2021-10-31 DIAGNOSIS — J4520 Mild intermittent asthma, uncomplicated: Secondary | ICD-10-CM

## 2021-10-31 NOTE — Progress Notes (Signed)
Ms. Susan Sexton states she does not know why she is here today and does not need this appt. Would prefer to not be charged today.

## 2021-11-22 NOTE — Progress Notes (Deleted)
Cardiology Clinic Note   Patient Name: Susan Sexton Date of Encounter: 11/22/2021  Primary Care Provider:  Claiborne Rigg, NP Primary Cardiologist:  Susan Ishikawa, MD  Patient Profile    Susan Sexton is a 36 y.o. female with a hx of premature CAD, hyperlipidemia, tobacco use who presents for follow-up.  She was admitted to [facility] on 05/04/2020 with NSTEMI.  She had presented to med [facility] ED with chest pain.  Found to have mildly elevated troponins (peak 156).  Despite her age, she was noted to have significant CAD risk factors as suspected FH given LDL to 40 and family history including father having CABG in his 47s.     Echocardiogram was done on 05/05/2020 which showed normal biventricular function, no significant valvular disease.  LHC on 05/05/2020 showed 99% proximal RCA stenosis, 50% proximal to mid RCA, 20% distal RCA, 40% proximal to mid LCx, 80% proximal to mid LAD.  RCA disease was treated with DES.  Staged PCI to the LAD was done on 05/23/2020.  Underwent DES x2 to proximal/mid LAD.  This was complicated by likely guide catheter dissection of the left main, which was treated with DES. She was to continue aspirin 81 mg daily, ticagrelor 90 mg twice daily, given extensive stenting, will continue DAPT beyond 1 year.   Past Medical History    Past Medical History:  Diagnosis Date   Asthma    Coronary artery disease    High cholesterol    Hyperlipidemia LDL goal <100 05/04/2020   NSTEMI (non-ST elevated myocardial infarction) [facility])    Past Surgical History:  Procedure Laterality Date   BREAST LUMPECTOMY Left 06/6/219   CARDIAC CATHETERIZATION     CORONARY STENT INTERVENTION N/A 05/05/2020   Procedure: CORONARY STENT INTERVENTION;  Surgeon: Susan Ouch, MD;  Location: [facility] INVASIVE CV LAB;  Service: Cardiovascular;  Laterality: N/A;   CORONARY STENT INTERVENTION N/A 05/23/2020   Procedure: CORONARY STENT INTERVENTION;  Surgeon: Susan Crafts, MD;   Location: [facility] INVASIVE CV LAB;  Service: Cardiovascular;  Laterality: N/A;   CORONARY STENT PLACEMENT  05/05/2020   INCISION AND DRAINAGE BREAST ABSCESS Left 09/2017   INTRAVASCULAR ULTRASOUND/IVUS N/A 05/23/2020   Procedure: Intravascular Ultrasound/IVUS;  Surgeon: Susan Crafts, MD;  Location: [facility] INVASIVE CV LAB;  Service: Cardiovascular;  Laterality: N/A;   LEFT HEART CATH AND CORONARY ANGIOGRAPHY N/A 05/05/2020   Procedure: LEFT HEART CATH AND CORONARY ANGIOGRAPHY;  Surgeon: Susan Ouch, MD;  Location: [facility] INVASIVE CV LAB;  Service: Cardiovascular;  Laterality: N/A;   LEFT HEART CATH AND CORONARY ANGIOGRAPHY N/A 05/23/2020   Procedure: LEFT HEART CATH AND CORONARY ANGIOGRAPHY;  Surgeon: Susan Crafts, MD;  Location: [facility] INVASIVE CV LAB;  Service: Cardiovascular;  Laterality: N/A;    Allergies  No Known Allergies  History of Present Illness    Susan Sexton returns to the office today for follow up of CAD with PCI to the proximal and mid LAD in 2022, hypertension, HL with SDOH issues leading to increased anxiety, followed by Child psychotherapist and [employer] [facility]. She was given BP monitor on last office visit and asked to record her BP each day and bring readings to the office. No changes were made to her medications. She will need fasting L/L with BMET this office visit.   Home Medications    Current Outpatient Medications  Medication Sig Dispense Refill   acetaminophen (TYLENOL) 500 MG tablet Take 500-1,000 mg by mouth every  6 (six) hours as needed (for pain.).     albuterol (VENTOLIN HFA) 108 (90 Base) MCG/ACT inhaler Inhale 2 puffs into the lungs every 6 (six) hours as needed for wheezing or shortness of breath. NEEDS PASS 18 g 1   Alirocumab (PRALUENT) 75 MG/ML SOAJ Inject 75 mg into the skin every 14 (fourteen) days. 2 mL 11   aspirin 81 MG chewable tablet Chew 1 tablet (81 mg total) by mouth daily.     atorvastatin (LIPITOR) 80 MG tablet Take 1 tablet (80  mg total) by mouth daily. 90 tablet 3   ezetimibe (ZETIA) 10 MG tablet Take 1 tablet (10 mg total) by mouth daily. 90 tablet 3   metoprolol succinate (TOPROL-XL) 25 MG 24 hr tablet Take 1/2 tablet (12.5 mg total) by mouth daily. (Patient taking differently: Take 12.5 mg by mouth daily. In The Evening) 45 tablet 3   Multiple Vitamins-Minerals (MULTI ADULT GUMMIES PO) Take 1 tablet by mouth daily.     nitroGLYCERIN (NITROSTAT) 0.4 MG SL tablet Place 1 tablet (0.4 mg total) under the tongue every 5 (five) minutes x 3 doses as needed for chest pain. 25 tablet 2   ticagrelor (BRILINTA) 90 MG TABS tablet Take 1 tablet (90 mg total) by mouth 2 (two) times daily. 180 tablet 3   No current facility-administered medications for this visit.     Family History    Family History  Problem Relation Age of Onset   Hypertension Mother    Heart attack Father 107       Had CABG, but died at age 56 after trauma   She indicated that her mother is alive. She indicated that her father is deceased.  Social History    Social History   Socioeconomic History   Marital status: Single    Spouse name: Not on file   Number of children: Not on file   Years of education: 11   Highest education level: 11th grade  Occupational History   Not on file  Tobacco Use   Smoking status: Former    Packs/day: 0.50    Types: Cigarettes    Quit date: 05/06/2020    Years since quitting: 1.5   Smokeless tobacco: Never   Tobacco comments:    Susan Sexton was given 1-800-quit-now. Susan Sexton says that she is smoking 1 cigarette a week  Vaping Use   Vaping Use: Former   Quit date: 05/06/2020  Substance and Sexual Activity   Alcohol use: No   Drug use: No   Sexual activity: Yes    Birth control/protection: None  Other Topics Concern   Not on file  Social History Narrative   Not on file   Social Determinants of Health   Financial Resource Strain: Medium Risk (10/19/2021)   Overall Financial Resource Strain (CARDIA)     Difficulty of Paying Living Expenses: Somewhat hard  Food Insecurity: No Food Insecurity (10/19/2021)   Hunger Vital Sign    Worried About Running Out of Food in the Last Year: Never true    Ran Out of Food in the Last Year: Never true  Transportation Needs: No Transportation Needs (10/19/2021)   PRAPARE - Administrator, Civil Service (Medical): No    Lack of Transportation (Non-Medical): No  Physical Activity: Not on file  Stress: Stress Concern Present (10/19/2021)   [employer] of Occupational Health - Occupational Stress Questionnaire    Feeling of Stress : Rather much  Social Connections: Not on file  Intimate  Partner Violence: Not on file     Review of Systems    General:  No chills, fever, night sweats or weight changes.  Cardiovascular:  No chest pain, dyspnea on exertion, edema, orthopnea, palpitations, paroxysmal nocturnal dyspnea. Dermatological: No rash, lesions/masses Respiratory: No cough, dyspnea Urologic: No hematuria, dysuria Abdominal:   No nausea, vomiting, diarrhea, bright red blood per rectum, melena, or hematemesis Neurologic:  No visual changes, wkns, changes in mental status. All other systems reviewed and are otherwise negative except as noted above.     Physical Exam    VS:  There were no vitals taken for this visit. , BMI There is no height or weight on file to calculate BMI.     GEN: Well nourished, well developed, in no acute distress. HEENT: normal. Neck: Supple, no JVD, carotid bruits, or masses. Cardiac: RRR, no murmurs, rubs, or gallops. No clubbing, cyanosis, edema.  Radials/DP/PT 2+ and equal bilaterally.  Respiratory:  Respirations regular and unlabored, clear to auscultation bilaterally. GI: Soft, nontender, nondistended, BS + x 4. MS: no deformity or atrophy. Skin: warm and dry, no rash. Neuro:  Strength and sensation are intact. Psych: Normal affect.  Accessory Clinical Findings    ECG personally reviewed by me  today- *** - No acute changes  Lab Results  Component Value Date   WBC 9.1 07/10/2021   HGB 12.7 07/10/2021   HCT 37.4 07/10/2021   MCV 92.3 07/10/2021   PLT 249 07/10/2021   Lab Results  Component Value Date   CREATININE 0.76 07/10/2021   BUN 10 07/10/2021   NA 137 07/10/2021   K 3.7 07/10/2021   CL 105 07/10/2021   CO2 25 07/10/2021   Lab Results  Component Value Date   ALT 13 03/15/2021   AST 17 03/15/2021   ALKPHOS 113 03/15/2021   BILITOT 0.3 03/15/2021   Lab Results  Component Value Date   CHOL 212 (H) 06/14/2021   HDL 48 06/14/2021   LDLCALC 157 (H) 06/14/2021   TRIG 39 06/14/2021   CHOLHDL 4.4 06/14/2021    No results found for: "HGBA1C"  Review of Prior Studies: LHC 05/05/2020 Prox RCA lesion is 99% stenosed. Prox RCA to Mid RCA lesion is 50% stenosed. Dist RCA lesion is 20% stenosed. Prox Cx to Mid Cx lesion is 40% stenosed. Prox LAD to Mid LAD lesion is 80% stenosed. A drug-eluting stent was successfully placed using a STENT RESOLUTE ONYX [id number]. Post intervention, there is a 0% residual stenosis. Post intervention, there is a 0% residual stenosis.   1.  Significant two-vessel coronary artery disease.  The culprit is 99% stenosis in the proximal RCA.  In addition, the mid RCA has moderate diffuse disease.  The LAD has 80% proximal/mid LAD at the bifurcation of a large diagonal branch. 2.  Left ventricular angiography was not performed.  EF was normal by echo.  Mildly elevated left ventricular end-diastolic pressure.\ 3.  Successful angioplasty and drug-eluting stent placement to the RCA.  I elected to use a long stent to cover the moderate disease in the mid vessel in addition to the critical stenosis in the proximal segment.   Coronary Stent Intervention 05/23/2020 Prox Cx to Mid Cx lesion is 40% stenosed. Dist RCA lesion is 20% stenosed. Previously placed Prox RCA drug eluting stent is widely patent. Previously placed Prox RCA to Mid RCA drug eluting  stent is widely patent. Prox LAD to Mid LAD lesion is 80% stenosed. A drug-eluting stent was successfully placed using a  STENT RESOLUTE ONYX 3.5X30, postdilated to 3.75 mm and optimized with intravascular ultrasound. Post intervention, there is a 0% residual stenosis. Mid LAD lesion is 75% stenosed in a very tortuous vessel, at the distal edge of the prior stent, likely representing edge dissection. A drug-eluting stent was successfully placed using a STENT RESOLUTE ONYX 3.5X8., Postdilated to 3.75 mm and optimized with intravascular ultrasound. Post intervention, there is a 0% residual stenosis. Mid LM lesion is 60% stenosed, likely guide catheter dissection. A drug-eluting stent was successfully placed using a STENT RESOLUTE ONYX 3.5X12, postdilated to greater than 4 mm in diameter and optimized with intravascular ultrasound. Post intervention, there is a 0% residual stenosis. LV end diastolic pressure is normal. There is no aortic valve stenosis.   Continue dual antiplatelet therapy for 12 months.  Given the length of stented segments in both her LAD, left main and RCA, would continue clopidogrel monotherapy after the 1 year of dual antiplatelet therapy.     Echocardiogram 05/05/2020 1. Left ventricular ejection fraction, by estimation, is 60 to 65%. The  left ventricle has normal function. The left ventricle has no regional  wall motion abnormalities. There is mild left ventricular hypertrophy.  Left ventricular diastolic parameters  were normal.   2. Right ventricular systolic function is normal. The right ventricular  size is normal. There is normal pulmonary artery systolic pressure. The  estimated right ventricular systolic pressure is 35.8 mmHg.   3. Right atrial size was mildly dilated.   4. The mitral valve is normal in structure. No evidence of mitral valve  regurgitation. No evidence of mitral stenosis.   5. The aortic valve is normal in structure. Aortic valve regurgitation is   not visualized. No aortic stenosis is present.   6. The inferior vena cava is dilated in size with <50% respiratory  variability, suggesting right atrial pressure of 15 mmHg  Assessment & Plan   1.  ***    Current medicines are reviewed at length with the patient today.  I have spent *** min's  dedicated to the care of this patient on the date of this encounter to include pre-visit review of records, assessment, management and diagnostic testing,with shared decision making. Signed, Bettey Mare. Liborio Nixon, ANP, AACC   11/22/2021 1:40 PM      Office [phone] Fax [phone]  Notice: This dictation was prepared with Dragon dictation along with smaller phrase technology. Any transcriptional errors that result from this process are unintentional and may not be corrected upon review.

## 2021-11-23 ENCOUNTER — Ambulatory Visit: Payer: Self-pay | Admitting: Adult Health

## 2021-11-29 NOTE — Progress Notes (Deleted)
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  Other history as described above.  She comes today with complaints of elevated blood pressure in the morning and headaches.   Despite her age, she was noted to have significant CAD risk factors as suspected FH given LDL to 22 and family history including father having CABG in his 30sShe is under a good deal of stress as she is having other family members with premature coronary artery disease, and CVA in 109s and 62s age group.    Echocardiogram was done on 05/05/2020 which showed normal biventricular function, no significant valvular disease.  LHC on 05/05/2020 showed 99% proximal RCA stenosis, 50% proximal to mid RCA, 20% distal RCA, 40% proximal to mid LCx, 80% proximal to mid LAD.  RCA disease was treated with DES.  Staged PCI to the LAD was done on 05/23/2020.  Underwent DES x2 to proximal/mid LAD.  This was complicated by likely guide catheter dissection of the left main, which was treated with DES. She was to continue aspirin 81 mg daily, ticagrelor 90 mg twice daily, given extensive stenting, will continue DAPT beyond 1 year. She is followed by [facility] for prescriptions and labs.   Past Medical History    Past Medical History:  Diagnosis Date   Asthma    Coronary artery disease    High cholesterol    Hyperlipidemia LDL goal <100 05/04/2020   NSTEMI (non-ST elevated myocardial infarction) [facility])    Past Surgical History:  Procedure Laterality Date   BREAST LUMPECTOMY Left 06/6/219   CARDIAC CATHETERIZATION     CORONARY STENT INTERVENTION N/A 05/05/2020   Procedure: CORONARY STENT INTERVENTION;  Surgeon: Iran Ouch, MD;   Location: [facility] INVASIVE CV LAB;  Service: Cardiovascular;  Laterality: N/A;   CORONARY STENT INTERVENTION N/A 05/23/2020   Procedure: CORONARY STENT INTERVENTION;  Surgeon: Corky Crafts, MD;  Location: [facility] INVASIVE CV LAB;  Service: Cardiovascular;  Laterality: N/A;   CORONARY STENT PLACEMENT  05/05/2020   INCISION AND DRAINAGE BREAST ABSCESS Left 09/2017   INTRAVASCULAR ULTRASOUND/IVUS N/A 05/23/2020   Procedure: Intravascular Ultrasound/IVUS;  Surgeon: Corky Crafts, MD;  Location: [facility] INVASIVE CV LAB;  Service: Cardiovascular;  Laterality: N/A;   LEFT HEART CATH AND CORONARY ANGIOGRAPHY N/A 05/05/2020   Procedure: LEFT HEART CATH AND CORONARY ANGIOGRAPHY;  Surgeon: Iran Ouch, MD;  Location: [facility] INVASIVE CV LAB;  Service: Cardiovascular;  Laterality: N/A;   LEFT HEART CATH AND CORONARY ANGIOGRAPHY N/A 05/23/2020   Procedure: LEFT HEART CATH AND CORONARY ANGIOGRAPHY;  Surgeon: Corky Crafts, MD;  Location: [facility] INVASIVE CV LAB;  Service: Cardiovascular;  Laterality: N/A;    Allergies  No Known Allergies  History of Present Illness    ***  Home Medications    Current Outpatient Medications  Medication Sig Dispense Refill   acetaminophen (TYLENOL) 500 MG tablet Take 500-1,000 mg by mouth every 6 (six) hours as needed (for pain.).     albuterol (VENTOLIN HFA) 108 (90 Base) MCG/ACT inhaler Inhale 2 puffs into the lungs every 6 (six) hours as needed for wheezing or shortness of breath. NEEDS PASS 18 g 1   Alirocumab (PRALUENT) 75 MG/ML SOAJ Inject 75 mg  into the skin every 14 (fourteen) days. 2 mL 11   aspirin 81 MG chewable tablet Chew 1 tablet (81 mg total) by mouth daily.     atorvastatin (LIPITOR) 80 MG tablet Take 1 tablet (80 mg total) by mouth daily. 90 tablet 3   ezetimibe (ZETIA) 10 MG tablet Take 1 tablet (10 mg total) by mouth daily. 90 tablet 3   metoprolol succinate (TOPROL-XL) 25 MG 24 hr tablet Take 1/2 tablet (12.5 mg total) by mouth daily. (Patient  taking differently: Take 12.5 mg by mouth daily. In The Evening) 45 tablet 3   Multiple Vitamins-Minerals (MULTI ADULT GUMMIES PO) Take 1 tablet by mouth daily.     nitroGLYCERIN (NITROSTAT) 0.4 MG SL tablet Place 1 tablet (0.4 mg total) under the tongue every 5 (five) minutes x 3 doses as needed for chest pain. 25 tablet 2   ticagrelor (BRILINTA) 90 MG TABS tablet Take 1 tablet (90 mg total) by mouth 2 (two) times daily. 180 tablet 3   No current facility-administered medications for this visit.     Family History    Family History  Problem Relation Age of Onset   Hypertension Mother    Heart attack Father 25       Had CABG, but died at age 40 after trauma   She indicated that her mother is alive. She indicated that her father is deceased.  Social History    Social History   Socioeconomic History   Marital status: Single    Spouse name: Not on file   Number of children: Not on file   Years of education: 11   Highest education level: 11th grade  Occupational History   Not on file  Tobacco Use   Smoking status: Former    Packs/day: 0.50    Types: Cigarettes    Quit date: 05/06/2020    Years since quitting: 1.5   Smokeless tobacco: Never   Tobacco comments:    Jerelyn was given 1-800-quit-now. Tuesday says that she is smoking 1 cigarette a week  Vaping Use   Vaping Use: Former   Quit date: 05/06/2020  Substance and Sexual Activity   Alcohol use: No   Drug use: No   Sexual activity: Yes    Birth control/protection: None  Other Topics Concern   Not on file  Social History Narrative   Not on file   Social Determinants of Health   Financial Resource Strain: Medium Risk (10/19/2021)   Overall Financial Resource Strain (CARDIA)    Difficulty of Paying Living Expenses: Somewhat hard  Food Insecurity: No Food Insecurity (10/19/2021)   Hunger Vital Sign    Worried About Running Out of Food in the Last Year: Never true    Ran Out of Food in the Last Year: Never true   Transportation Needs: No Transportation Needs (10/19/2021)   PRAPARE - Hydrologist (Medical): No    Lack of Transportation (Non-Medical): No  Physical Activity: Not on file  Stress: Stress Concern Present (10/19/2021)   [place]    Feeling of Stress : Rather much  Social Connections: Not on file  Intimate Partner Violence: Not on file     Review of Systems    General:  No chills, fever, night sweats or weight changes.  Cardiovascular:  No chest pain, dyspnea on exertion, edema, orthopnea, palpitations, paroxysmal nocturnal dyspnea. Dermatological: No rash, lesions/masses Respiratory: No cough, dyspnea Urologic: No hematuria, dysuria  Abdominal:   No nausea, vomiting, diarrhea, bright red blood per rectum, melena, or hematemesis Neurologic:  No visual changes, wkns, changes in mental status. All other systems reviewed and are otherwise negative except as noted above.     Physical Exam    VS:  There were no vitals taken for this visit. , BMI There is no height or weight on file to calculate BMI.     GEN: Well nourished, well developed, in no acute distress. HEENT: normal. Neck: Supple, no JVD, carotid bruits, or masses. Cardiac: RRR, no murmurs, rubs, or gallops. No clubbing, cyanosis, edema.  Radials/DP/PT 2+ and equal bilaterally.  Respiratory:  Respirations regular and unlabored, clear to auscultation bilaterally. GI: Soft, nontender, nondistended, BS + x 4. MS: no deformity or atrophy. Skin: warm and dry, no rash. Neuro:  Strength and sensation are intact. Psych: Normal affect.  Accessory Clinical Findings    ECG personally reviewed by me today- *** - No acute changes  Lab Results  Component Value Date   WBC 9.1 07/10/2021   HGB 12.7 07/10/2021   HCT 37.4 07/10/2021   MCV 92.3 07/10/2021   PLT 249 07/10/2021   Lab Results  Component Value Date   CREATININE 0.76  07/10/2021   BUN 10 07/10/2021   NA 137 07/10/2021   K 3.7 07/10/2021   CL 105 07/10/2021   CO2 25 07/10/2021   Lab Results  Component Value Date   ALT 13 03/15/2021   AST 17 03/15/2021   ALKPHOS 113 03/15/2021   BILITOT 0.3 03/15/2021   Lab Results  Component Value Date   CHOL 212 (H) 06/14/2021   HDL 48 06/14/2021   LDLCALC 157 (H) 06/14/2021   TRIG 39 06/14/2021   CHOLHDL 4.4 06/14/2021    No results found for: "HGBA1C"  Review of Prior Studies: LHC 05/05/2020 Prox RCA lesion is 99% stenosed. Prox RCA to Mid RCA lesion is 50% stenosed. Dist RCA lesion is 20% stenosed. Prox Cx to Mid Cx lesion is 40% stenosed. Prox LAD to Mid LAD lesion is 80% stenosed. A drug-eluting stent was successfully placed using a STENT RESOLUTE ONYX [id number]. Post intervention, there is a 0% residual stenosis. Post intervention, there is a 0% residual stenosis.   1.  Significant two-vessel coronary artery disease.  The culprit is 99% stenosis in the proximal RCA.  In addition, the mid RCA has moderate diffuse disease.  The LAD has 80% proximal/mid LAD at the bifurcation of a large diagonal branch. 2.  Left ventricular angiography was not performed.  EF was normal by echo.  Mildly elevated left ventricular end-diastolic pressure.\ 3.  Successful angioplasty and drug-eluting stent placement to the RCA.  I elected to use a long stent to cover the moderate disease in the mid vessel in addition to the critical stenosis in the proximal segment.   Coronary Stent Intervention 05/23/2020 Prox Cx to Mid Cx lesion is 40% stenosed. Dist RCA lesion is 20% stenosed. Previously placed Prox RCA drug eluting stent is widely patent. Previously placed Prox RCA to Mid RCA drug eluting stent is widely patent. Prox LAD to Mid LAD lesion is 80% stenosed. A drug-eluting stent was successfully placed using a STENT RESOLUTE ONYX 3.5X30, postdilated to 3.75 mm and optimized with intravascular ultrasound. Post intervention,  there is a 0% residual stenosis. Mid LAD lesion is 75% stenosed in a very tortuous vessel, at the distal edge of the prior stent, likely representing edge dissection. A drug-eluting stent was successfully placed using a  STENT RESOLUTE ONYX 3.5X8., Postdilated to 3.75 mm and optimized with intravascular ultrasound. Post intervention, there is a 0% residual stenosis. Mid LM lesion is 60% stenosed, likely guide catheter dissection. A drug-eluting stent was successfully placed using a STENT RESOLUTE ONYX 3.5X12, postdilated to greater than 4 mm in diameter and optimized with intravascular ultrasound. Post intervention, there is a 0% residual stenosis. LV end diastolic pressure is normal. There is no aortic valve stenosis.   Continue dual antiplatelet therapy for 12 months.  Given the length of stented segments in both her LAD, left main and RCA, would continue clopidogrel monotherapy after the 1 year of dual antiplatelet therapy.     Echocardiogram 05/05/2020 1. Left ventricular ejection fraction, by estimation, is 60 to 65%. The  left ventricle has normal function. The left ventricle has no regional  wall motion abnormalities. There is mild left ventricular hypertrophy.  Left ventricular diastolic parameters  were normal.   2. Right ventricular systolic function is normal. The right ventricular  size is normal. There is normal pulmonary artery systolic pressure. The  estimated right ventricular systolic pressure is 35.8 mmHg.   3. Right atrial size was mildly dilated.   4. The mitral valve is normal in structure. No evidence of mitral valve  regurgitation. No evidence of mitral stenosis.   5. The aortic valve is normal in structure. Aortic valve regurgitation is  not visualized. No aortic stenosis is present.   6. The inferior vena cava is dilated in size with <50% respiratory  variability, suggesting right atrial pressure of 15 mmHg.   Assessment & Plan   1.  ***    Current medicines  are reviewed at length with the patient today.  I have spent *** min's  dedicated to the care of this patient on the date of this encounter to include pre-visit review of records, assessment, management and diagnostic testing,with shared decision making. Signed, Bettey Mare. Liborio Nixon, ANP, AACC   11/29/2021 11:13 AM      Office [phone] Fax [phone]  Notice: This dictation was prepared with Dragon dictation along with smaller phrase technology. Any transcriptional errors that result from this process are unintentional and may not be corrected upon review.

## 2021-11-30 ENCOUNTER — Ambulatory Visit: Payer: Self-pay | Attending: Adult Health | Admitting: Adult Health

## 2021-12-17 ENCOUNTER — Encounter (HOSPITAL_BASED_OUTPATIENT_CLINIC_OR_DEPARTMENT_OTHER): Payer: Self-pay | Admitting: Pediatrics

## 2021-12-17 ENCOUNTER — Emergency Department (HOSPITAL_BASED_OUTPATIENT_CLINIC_OR_DEPARTMENT_OTHER)
Admission: EM | Admit: 2021-12-17 | Discharge: 2021-12-17 | Disposition: A | Discharge status: Home / Self Care | Payer: Self-pay | Attending: Emergency Medicine | Admitting: Emergency Medicine

## 2021-12-17 ENCOUNTER — Emergency Department (HOSPITAL_BASED_OUTPATIENT_CLINIC_OR_DEPARTMENT_OTHER): Payer: Self-pay

## 2021-12-17 ENCOUNTER — Other Ambulatory Visit: Payer: Self-pay

## 2021-12-17 DIAGNOSIS — J45909 Unspecified asthma, uncomplicated: Secondary | ICD-10-CM | POA: Insufficient documentation

## 2021-12-17 DIAGNOSIS — R42 Dizziness and giddiness: Secondary | ICD-10-CM | POA: Insufficient documentation

## 2021-12-17 DIAGNOSIS — R059 Cough, unspecified: Secondary | ICD-10-CM | POA: Insufficient documentation

## 2021-12-17 DIAGNOSIS — Z7982 Long term (current) use of aspirin: Secondary | ICD-10-CM | POA: Insufficient documentation

## 2021-12-17 DIAGNOSIS — R051 Acute cough: Secondary | ICD-10-CM

## 2021-12-17 DIAGNOSIS — I2510 Atherosclerotic heart disease of native coronary artery without angina pectoris: Secondary | ICD-10-CM | POA: Insufficient documentation
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
## 2021-12-17 NOTE — Progress Notes (Signed)
RT assessed patient upon arrival to room. NO distress noted. SAT 98%. I checked CO level with portable monitor and reading was 0.

## 2022-01-05 ENCOUNTER — Other Ambulatory Visit (HOSPITAL_COMMUNITY): Payer: Self-pay

## 2022-01-05 ENCOUNTER — Other Ambulatory Visit: Payer: Self-pay | Admitting: Cardiology

## 2022-01-05 DIAGNOSIS — I214 Non-ST elevation (NSTEMI) myocardial infarction: Secondary | ICD-10-CM

## 2022-01-05 DIAGNOSIS — I200 Unstable angina: Secondary | ICD-10-CM

## 2022-01-05 DIAGNOSIS — E785 Hyperlipidemia, unspecified: Secondary | ICD-10-CM

## 2022-01-07 ENCOUNTER — Other Ambulatory Visit (HOSPITAL_COMMUNITY): Payer: Self-pay

## 2022-01-07 ENCOUNTER — Encounter (HOSPITAL_COMMUNITY): Payer: Self-pay

## 2022-01-08 ENCOUNTER — Other Ambulatory Visit: Payer: Self-pay

## 2022-01-08 MED ORDER — REPATHA SURECLICK 140 MG/ML ~~LOC~~ SOAJ
1.0000 mL | SUBCUTANEOUS | 3 refills | Status: DC
  Filled 2022-01-08: qty 6, 84d supply, fill #0
  Filled 2022-04-06 – 2022-05-27 (×2): qty 6, 84d supply, fill #1
  Filled 2022-10-27 – 2022-11-08 (×3): qty 6, 84d supply, fill #2

## 2022-01-08 NOTE — Telephone Encounter (Signed)
PA for Repatha submitted. [name]  Approved through 01/07/23.  Plan no longer prefers Praluent

## 2022-01-09 ENCOUNTER — Other Ambulatory Visit: Payer: Self-pay

## 2022-01-17 ENCOUNTER — Ambulatory Visit: Payer: Self-pay | Admitting: Cardiology

## 2022-01-17 NOTE — Progress Notes (Deleted)
Cardiology Office Note:    Date:  07/13/2021   ID:  Falana, Clagg 05-19-1985, MRN [id number]  PCP:  Claiborne Rigg, NP  Cardiologist:  Little Ishikawa, MD  Electrophysiologist:  None   Referring MD: Claiborne Rigg, NP   Chief Complaint  Patient presents with   Coronary Artery Disease    History of Present Illness:    Susan Sexton is a 36 y.o. female with a hx of CAD, hyperlipidemia, tobacco use who presents for follow-up.  She was admitted to [facility] on 05/04/2020 with NSTEMI.  She had presented to med [facility] ED with chest pain.  Found to have mildly elevated troponins (peak 156).  Despite her age, she was noted to have significant CAD risk factors as suspected FH given LDL to 40 and family history including father having CABG in his 41s.  Echocardiogram was done on 05/05/2020 which showed normal biventricular function, no significant valvular disease.  LHC on 05/05/2020 showed 99% proximal RCA stenosis, 50% proximal to mid RCA, 20% distal RCA, 40% proximal to mid LCx, 80% proximal to mid LAD.  RCA disease was treated with DES.  Staged PCI to the LAD was done on 05/23/2020.  Underwent DES x2 to proximal/mid LAD.  This was complicated by likely guide catheter dissection of the left main, which was treated with DES.  Since last clinic visit, she reports that she is doing okay.  Reports has been having chest pain recently, describes as dull pain on left side of chest, last for few seconds and resolves.  Went to ED yesterday, troponins negative.  Unclear cause, seems to be related to stress.  States that does not feel like the pain she had before her MI.  She denies any shortness of breath.  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  Reports she fatigues easily at work but denies any exertional chest pain or dyspnea.  Reports intermittent lightheadedness but denies any syncope.  Denies any lower extremity edema or palpitations.   Reports she quit smoking cigarettes.  Past Medical History:  Diagnosis Date   Asthma    Coronary artery disease    High cholesterol    Hyperlipidemia LDL goal <100 05/04/2020   NSTEMI (non-ST elevated myocardial infarction) [facility])     Past Surgical History:  Procedure Laterality Date   BREAST LUMPECTOMY Left 06/6/219   CARDIAC CATHETERIZATION     CORONARY STENT INTERVENTION N/A 05/05/2020   Procedure: CORONARY STENT INTERVENTION;  Surgeon: Iran Ouch, MD;  Location: [facility] INVASIVE CV LAB;  Service: Cardiovascular;  Laterality: N/A;   CORONARY STENT INTERVENTION N/A 05/23/2020   Procedure: CORONARY STENT INTERVENTION;  Surgeon: Corky Crafts, MD;  Location: [facility] INVASIVE CV LAB;  Service: Cardiovascular;  Laterality: N/A;   CORONARY STENT PLACEMENT  05/05/2020   INCISION AND DRAINAGE BREAST ABSCESS Left 09/2017   INTRAVASCULAR ULTRASOUND/IVUS N/A 05/23/2020   Procedure: Intravascular Ultrasound/IVUS;  Surgeon: Corky Crafts, MD;  Location: [facility] INVASIVE CV LAB;  Service: Cardiovascular;  Laterality: N/A;   LEFT HEART CATH AND CORONARY ANGIOGRAPHY N/A 05/05/2020   Procedure: LEFT HEART CATH AND CORONARY ANGIOGRAPHY;  Surgeon: Iran Ouch, MD;  Location: [facility] INVASIVE CV LAB;  Service: Cardiovascular;  Laterality: N/A;   LEFT HEART CATH AND CORONARY ANGIOGRAPHY N/A 05/23/2020   Procedure: LEFT HEART CATH AND CORONARY ANGIOGRAPHY;  Surgeon: Corky Crafts, MD;  Location: [facility] INVASIVE CV LAB;  Service: Cardiovascular;  Laterality: N/A;    Current Medications: Current Meds  Medication Sig   acetaminophen (TYLENOL) 500 MG tablet Take 500-1,000 mg by mouth every 6 (six) hours as needed (for pain.).   albuterol (VENTOLIN HFA) 108 (90 Base) MCG/ACT inhaler Inhale 2 puffs into the lungs every 6 (six) hours as needed for wheezing or shortness of breath. NEEDS PASS   aspirin 81 MG chewable tablet Chew 1 tablet (81 mg total) by mouth daily.   Multiple Vitamins-Minerals  (MULTI ADULT GUMMIES PO) Take 1 tablet by mouth daily.   nitroGLYCERIN (NITROSTAT) 0.4 MG SL tablet Place 1 tablet (0.4 mg total) under the tongue every 5 (five) minutes x 3 doses as needed for chest pain.   [DISCONTINUED] atorvastatin (LIPITOR) 80 MG tablet Take 1 tablet (80 mg total) by mouth daily.   [DISCONTINUED] ezetimibe (ZETIA) 10 MG tablet Take 1 tablet (10 mg total) by mouth daily.   [DISCONTINUED] metoprolol succinate (TOPROL-XL) 25 MG 24 hr tablet Take 0.5 tablets (12.5 mg total) by mouth daily.   [DISCONTINUED] ticagrelor (BRILINTA) 90 MG TABS tablet Take 90 mg by mouth 2 (two) times daily.     Allergies:   Patient has no known allergies.   Social History   Socioeconomic History   Marital status: Single    Spouse name: Not on file   Number of children: Not on file   Years of education: 11   Highest education level: 11th grade  Occupational History   Not on file  Tobacco Use   Smoking status: Former    Packs/day: 0.50    Types: Cigarettes    Quit date: 05/06/2020    Years since quitting: 1.1   Smokeless tobacco: Never   Tobacco comments:    Alora was given 1-800-quit-now. Bryah says that she is smoking 1 cigarette a week  Vaping Use   Vaping Use: Former   Quit date: 05/06/2020  Substance and Sexual Activity   Alcohol use: No   Drug use: No   Sexual activity: Yes    Birth control/protection: None  Other Topics Concern   Not on file  Social History Narrative   Not on file   Social Determinants of Health   Financial Resource Strain: Not on file  Food Insecurity: Not on file  Transportation Needs: Not on file  Physical Activity: Not on file  Stress: Not on file  Social Connections: Not on file     Family History: The patient's family history includes Heart attack (age of onset: 69) in her father; Hypertension in her mother.  ROS:   Please see the history of present illness.   All other systems reviewed and are negative.  EKGs/Labs/Other Studies  Reviewed:    The following studies were reviewed today:   EKG:   05/17/2020: normal sinus rhythm, rate 91, no ST/T abnormalities 07/10/2020: normal sinus rhythm, rate 66 bpm, no ST/T abnormalities   Recent Labs: 03/15/2021: ALT 13 07/10/2021: BUN 10; Creatinine, Ser 0.76; Hemoglobin 12.7; Platelets 249; Potassium 3.7; Sodium 137  Recent Lipid Panel    Component Value Date/Time   CHOL 212 (H) 06/14/2021 1109   TRIG 39 06/14/2021 1109   HDL 48 06/14/2021 1109   CHOLHDL 4.4 06/14/2021 1109   CHOLHDL 4.7 05/05/2020 0012   VLDL 10 05/05/2020 0012   LDLCALC 157 (H) 06/14/2021 1109    Physical Exam:    VS:  BP 126/81   Pulse 72   Ht 5\' 8"  (1.727 m)   Wt 160 lb 9.6 oz (  72.8 kg)   LMP 06/14/2021 (Approximate) Comment: last day  SpO2 98%   BMI 24.42 kg/m     Wt Readings from Last 3 Encounters:  07/12/21 160 lb 9.6 oz (72.8 kg)  07/10/21 158 lb (71.7 kg)  06/14/21 159 lb (72.1 kg)     GEN:  Well nourished, well developed in no acute distress HEENT: Normal NECK: No JVD; No carotid bruits CARDIAC: RRR, no murmurs, rubs, gallops RESPIRATORY:  Clear to auscultation without rales, wheezing or rhonchi  ABDOMEN: Soft, non-tender, non-distended MUSCULOSKELETAL:  No edema; No deformity  SKIN: Warm and dry NEUROLOGIC:  Alert and oriented x 3 PSYCHIATRIC:  Normal affect   ASSESSMENT:    1. CAD in native artery   2. Hyperlipidemia, unspecified hyperlipidemia type   3. Lightheadedness   4. Tobacco use      PLAN:    CAD: Presented with NSTEMI 05/04/2020.  Echocardiogram was done on 05/05/2020 which showed normal biventricular function, no significant valvular disease.  LHC on 05/05/2020 showed 99% proximal RCA stenosis, 50% proximal to mid RCA, 20% distal RCA, 40% proximal to mid LCx, 80% proximal to mid LAD.  RCA disease was treated with DES.  Staged PCI to the LAD was done on 05/23/2020.  Underwent DES x2 to proximal/mid LAD.  This was complicated by likely guide catheter dissection of  the left main, which was treated with DES. -Continue aspirin 81 mg daily, ticagrelor 90 mg twice daily.  Given extensive stenting, will continue DAPT beyond 1 year -Continue atorvastatin/Zetia, referred to lipid clinic for PCSK9 inhibitor -Continue Toprol-XL 12.5 mg daily -Her current chest pain sounds noncardiac, as reports pain lasting for few seconds and resolving.  Denies any exertional chest pain continue to monitor  Hyperlipidemia: Suspect FH given LDL 282 in 01/2020 and family history including father had CABG in 30s.  Was started on atorvastatin 20 mg daily with improvement in LDL to 171 on 2/25.  Atorvastatin dose increased to 80 mg daily at that time.  LDL 169 on 07/10/2020, referred to lipid clinic for PCSK9 inhibitor, started on Repatha but was not able to afford.  She reports she now has insurance with her new job, will refer back to pharmacy lipid clinic to restart Repatha  Lightheadedness: Suspect due to medication use in setting of soft blood pressures.  Discontinued losartan.  Continued low-dose metoprolol as above, she reports has improved  Tobacco use: Smoked 1 pack/day x 15 years.  She reports she has quit smoking.  Congratulated patient on quitting and encouraged continued cessation  RTC in 3 months  Medication Adjustments/Labs and Tests Ordered: Current medicines are reviewed at length with the patient today.  Concerns regarding medicines are outlined above.  No orders of the defined types were placed in this encounter.   Meds ordered this encounter  Medications   atorvastatin (LIPITOR) 80 MG tablet    Sig: Take 1 tablet (80 mg total) by mouth daily.    Dispense:  90 tablet    Refill:  3   ezetimibe (ZETIA) 10 MG tablet    Sig: Take 1 tablet (10 mg total) by mouth daily.    Dispense:  90 tablet    Refill:  3   metoprolol succinate (TOPROL-XL) 25 MG 24 hr tablet    Sig: Take 1/2 tablet (12.5 mg total) by mouth daily.    Dispense:  45 tablet    Refill:  3    ticagrelor (BRILINTA) 90 MG TABS tablet    Sig: Take 1  tablet (90 mg total) by mouth 2 (two) times daily.    Dispense:  180 tablet    Refill:  3     Patient Instructions  Medication Instructions:  Your physician recommends that you continue on your current medications as directed. Please refer to the Current Medication list given to you today.  *If you need a refill on your cardiac medications before your next appointment, please call your pharmacy*  Follow-Up: At [facility], you and your health needs are our priority.  As part of our continuing mission to provide you with exceptional heart care, we have created designated Provider Care Teams.  These Care Teams include your primary Cardiologist (physician) and Advanced Practice Providers (APPs -  Physician Assistants and Nurse Practitioners) who all work together to provide you with the care you need, when you need it.  We recommend signing up for the patient portal called "MyChart".  Sign up information is provided on this After Visit Summary.  MyChart is used to connect with patients for Virtual Visits (Telemedicine).  Patients are able to view lab/test results, encounter notes, upcoming appointments, etc.  Non-urgent messages can be sent to your provider as well.   To learn more about what you can do with MyChart, go to [email or web address].    Your next appointment:   1st available with pharmD lipid clinic 3 months with NP/PA 6 months with Dr. Bjorn Pippin   Important Information About Sugar           Signed, Little Ishikawa, MD  07/13/2021 2:29 PM    [facility]

## 2022-01-18 ENCOUNTER — Other Ambulatory Visit (HOSPITAL_COMMUNITY): Payer: Self-pay

## 2022-01-18 ENCOUNTER — Encounter (HOSPITAL_COMMUNITY): Payer: Self-pay

## 2022-01-23 ENCOUNTER — Other Ambulatory Visit (HOSPITAL_COMMUNITY): Payer: Self-pay

## 2022-01-25 ENCOUNTER — Other Ambulatory Visit (HOSPITAL_COMMUNITY): Payer: Self-pay

## 2022-02-06 ENCOUNTER — Telehealth: Payer: Self-pay | Admitting: Physician Assistant

## 2022-02-06 DIAGNOSIS — R3989 Other symptoms and signs involving the genitourinary system: Secondary | ICD-10-CM

## 2022-02-06 MED ORDER — CEPHALEXIN 500 MG PO CAPS: 500.0000 mg | ORAL_CAPSULE | Freq: Two times a day (BID) | ORAL | 0 refills | Status: AC

## 2022-02-06 NOTE — Progress Notes (Signed)
I have spent 5 minutes in review of e-visit questionnaire, review and updating patient chart, medical decision making and response to patient.   Kelliann Pendergraph Cody Carlea Badour, PA-C    

## 2022-02-06 NOTE — Progress Notes (Signed)

## 2022-02-13 ENCOUNTER — Telehealth: Payer: Self-pay | Admitting: Physician Assistant

## 2022-02-13 DIAGNOSIS — T3695XA Adverse effect of unspecified systemic antibiotic, initial encounter: Secondary | ICD-10-CM

## 2022-02-13 DIAGNOSIS — B379 Candidiasis, unspecified: Secondary | ICD-10-CM

## 2022-02-13 MED ORDER — FLUCONAZOLE 150 MG PO TABS: ORAL_TABLET | ORAL | 0 refills | Status: DC

## 2022-02-13 NOTE — Progress Notes (Signed)
I have spent 5 minutes in review of e-visit questionnaire, review and updating patient chart, medical decision making and response to patient.   Kalenna Millett Cody Seraphine Gudiel, PA-C    

## 2022-02-13 NOTE — Progress Notes (Signed)

## 2022-02-20 ENCOUNTER — Other Ambulatory Visit (HOSPITAL_COMMUNITY): Payer: Self-pay

## 2022-02-21 ENCOUNTER — Other Ambulatory Visit: Payer: Self-pay

## 2022-02-21 ENCOUNTER — Other Ambulatory Visit (HOSPITAL_COMMUNITY): Payer: Self-pay

## 2022-02-26 ENCOUNTER — Telehealth: Payer: Self-pay | Admitting: Licensed Clinical Social Worker

## 2022-02-26 NOTE — Telephone Encounter (Signed)
H&V Care Navigation CSW Progress Note  Clinical Social Worker contacted patient by phone to f/u on message left after clinic on 12/18. Was able to reach pt this morning at [phone], pt shares that she had a rocky month at work due to flu, family emergencies and Thanksgiving break. She is short on her December rent and has contacted multiple organizations who have no funding at this time including Holiday representative, DSS, and [employer]. Pt is living in an apt currently that is 1125$ a month. She shares that she is actively trying to find a cheaper place since she feels unsure about having a roommate. She shares that she is interested in resources to help her get through the rest of the month- she should be in a better position in January, her company is being aquired and she has been told that she will have a raise then also. We discussed one time assistance through Patient Care Fund- she is agreeable. Provided me landlords information, will gather needed documents and I have sent her housing lists and food resources to her email [email or web address].   I also sent email with her permission to her landlord Tresa Endo at [email or web address] requesting W9 to be completed.  I will f/u if I do not hear from pt/landlord by the end of the week. Will try and get submitted before holiday if able.   Patient is participating in a Managed Medicaid Plan:  No, BCBS commercial plan only.   SDOH Screenings   Food Insecurity: Food Insecurity Present (02/26/2022)  Housing: Medium Risk (02/26/2022)  Transportation Needs: No Transportation Needs (02/26/2022)  Utilities: Not At Risk (02/26/2022)  Depression (PHQ2-9): Medium Risk (07/26/2021)  Financial Resource Strain: Medium Risk (02/26/2022)  Stress: Stress Concern Present (10/19/2021)  Tobacco Use: Medium Risk (12/17/2021)   02/16/2022, MSW, LCSW Clinical Social Worker II [facility] Heart/Vascular Care Navigation  [phone]- work  cell phone (preferred) [phone]- desk phone

## 2022-02-28 ENCOUNTER — Telehealth: Payer: Self-pay | Admitting: Licensed Clinical Social Worker

## 2022-02-28 NOTE — Telephone Encounter (Signed)
H&V Care Navigation CSW Progress Note  Clinical Social Worker  received all paperwork (2022 tax return, patient care fund agreement, W9, lease, and patient care agreement  to submit for assistance 1x with Patient Care Fund towards rent. This was submitted on pt behalf. I let her know she is responsible for any outstanding balance outside of assistance provided. I also called pt landlord [employer] to provide update. If any issues I will let them both know.    Patient is participating in a Managed Medicaid Plan:  No, BCBS commercial plan.   SDOH Screenings   Food Insecurity: Food Insecurity Present (02/26/2022)  Housing: Medium Risk (02/26/2022)  Transportation Needs: No Transportation Needs (02/26/2022)  Utilities: Not At Risk (02/26/2022)  Depression (PHQ2-9): Medium Risk (07/26/2021)  Financial Resource Strain: Medium Risk (02/26/2022)  Stress: Stress Concern Present (10/19/2021)  Tobacco Use: Medium Risk (12/17/2021)    Octavio Graves, MSW, LCSW Clinical Social Worker II [facility] Heart/Vascular Care Navigation  [phone]- work cell phone (preferred) [phone]- desk phone

## 2022-03-05 ENCOUNTER — Other Ambulatory Visit (HOSPITAL_COMMUNITY): Payer: Self-pay

## 2022-03-12 ENCOUNTER — Telehealth: Payer: Self-pay | Admitting: Licensed Clinical Social Worker

## 2022-03-12 NOTE — Telephone Encounter (Signed)
H&V Care Navigation CSW Progress Note  Clinical Social Worker contacted patient by phone to f/u on call inquiring about status of assistance. LCSW reached pt at [phone]. I let pt know that Patient Care Fund request had been submitted to [facility] accounts payable on 02/28/2022. We have not heard any additional issues or updates, check was to be mailed directly to pt landlord for max amount of assistance, pt responsible for outstanding balance. Pt aware, appreciative of assistance. She states that this whole challenge with paying her bills, being out of work, her company being bought out and being sick has made her feel more depressed and stressed. We discussed assistance options such as outpatient counseling and crisis walk in services. Pt interested in referral to LCSW through PCP who provides [place] counseling to community clinic pts. Encouraged her to reach out to [place] regarding additional assistance options for food etc should resources sent not be sufficient to meet her needs. She has submitted an application to another apartment that is $900/mo and will be more affordable for her to live alone. I remain available for pt as needed, referral securely sent to Wendall Papa, w/ PCP team. I will f/u.   Patient is participating in a Managed Medicaid Plan:  No, BCBS commercial only.   SDOH Screenings   Food Insecurity: Food Insecurity Present (02/26/2022)  Housing: Medium Risk (02/26/2022)  Transportation Needs: No Transportation Needs (02/26/2022)  Utilities: Not At Risk (02/26/2022)  Depression (PHQ2-9): Medium Risk (07/26/2021)  Financial Resource Strain: Medium Risk (02/26/2022)  Stress: Stress Concern Present (10/19/2021)  Tobacco Use: Medium Risk (12/17/2021)   Westley Hummer, MSW, [place]  [phone]- work cell phone (preferred) [phone]- desk phone

## 2022-03-14 ENCOUNTER — Telehealth: Payer: Self-pay | Admitting: Licensed Clinical Social Worker

## 2022-03-14 NOTE — Telephone Encounter (Signed)
H&V Care Navigation CSW Progress Note  Clinical Social Worker contacted patient by phone to f/u on community resources for mental health. Unfortunately, LCSW w/ PCP office does not have appts soon, she recommended I provide additional community support options. LCSW reached out to pt at [phone], left voicemail letting her know if she is interested in alternate options to give me a call back. I will f/u again as able.   Patient is participating in a Managed Medicaid Plan:  No, BCBS commercial plan only.   SDOH Screenings   Food Insecurity: Food Insecurity Present (02/26/2022)  Housing: Medium Risk (02/26/2022)  Transportation Needs: No Transportation Needs (02/26/2022)  Utilities: Not At Risk (02/26/2022)  Depression (PHQ2-9): Medium Risk (07/26/2021)  Financial Resource Strain: Medium Risk (02/26/2022)  Stress: Stress Concern Present (10/19/2021)  Tobacco Use: Medium Risk (12/17/2021)    Westley Hummer, MSW, [place]  [phone]- work cell phone (preferred) [phone]- desk phone

## 2022-03-15 ENCOUNTER — Telehealth: Payer: Self-pay | Admitting: Licensed Clinical Social Worker

## 2022-03-15 NOTE — Telephone Encounter (Signed)
H&V Care Navigation CSW Progress Note  Clinical Social Worker contacted patient by phone to f/u on community resources for mental health again. Phone again went to voicemail at [phone], left additonal voicemail letting her know if she is interested in alternate options to give me a call back. I attempted to reach her sister Teressa Senter but the number listed [phone] led to a voicemail that did not match the name we have listed therefore did not leave voicemail. I also sent pt a text. I will try and f/u again next week as able.     Patient is participating in a Managed Medicaid Plan:  No, BCBS commercial plan only.     SDOH Screenings   Food Insecurity: Food Insecurity Present (02/26/2022)  Housing: Medium Risk (02/26/2022)  Transportation Needs: No Transportation Needs (02/26/2022)  Utilities: Not At Risk (02/26/2022)  Depression (PHQ2-9): Medium Risk (07/26/2021)  Financial Resource Strain: Medium Risk (02/26/2022)  Stress: Stress Concern Present (10/19/2021)  Tobacco Use: Medium Risk (12/17/2021)   Westley Hummer, MSW, [place]  [phone]- work cell phone (preferred) [phone]- desk phone

## 2022-03-20 IMAGING — DX DG CHEST 1V PORT
1 series · 1 of 1 positions shown · non-contrast
Comparison: Chest radiograph 09/17/2015

CLINICAL DATA: Chest pains, SOB, chest tightness for a few days.

EXAM:
PORTABLE CHEST 1 VIEW

[chest ap]
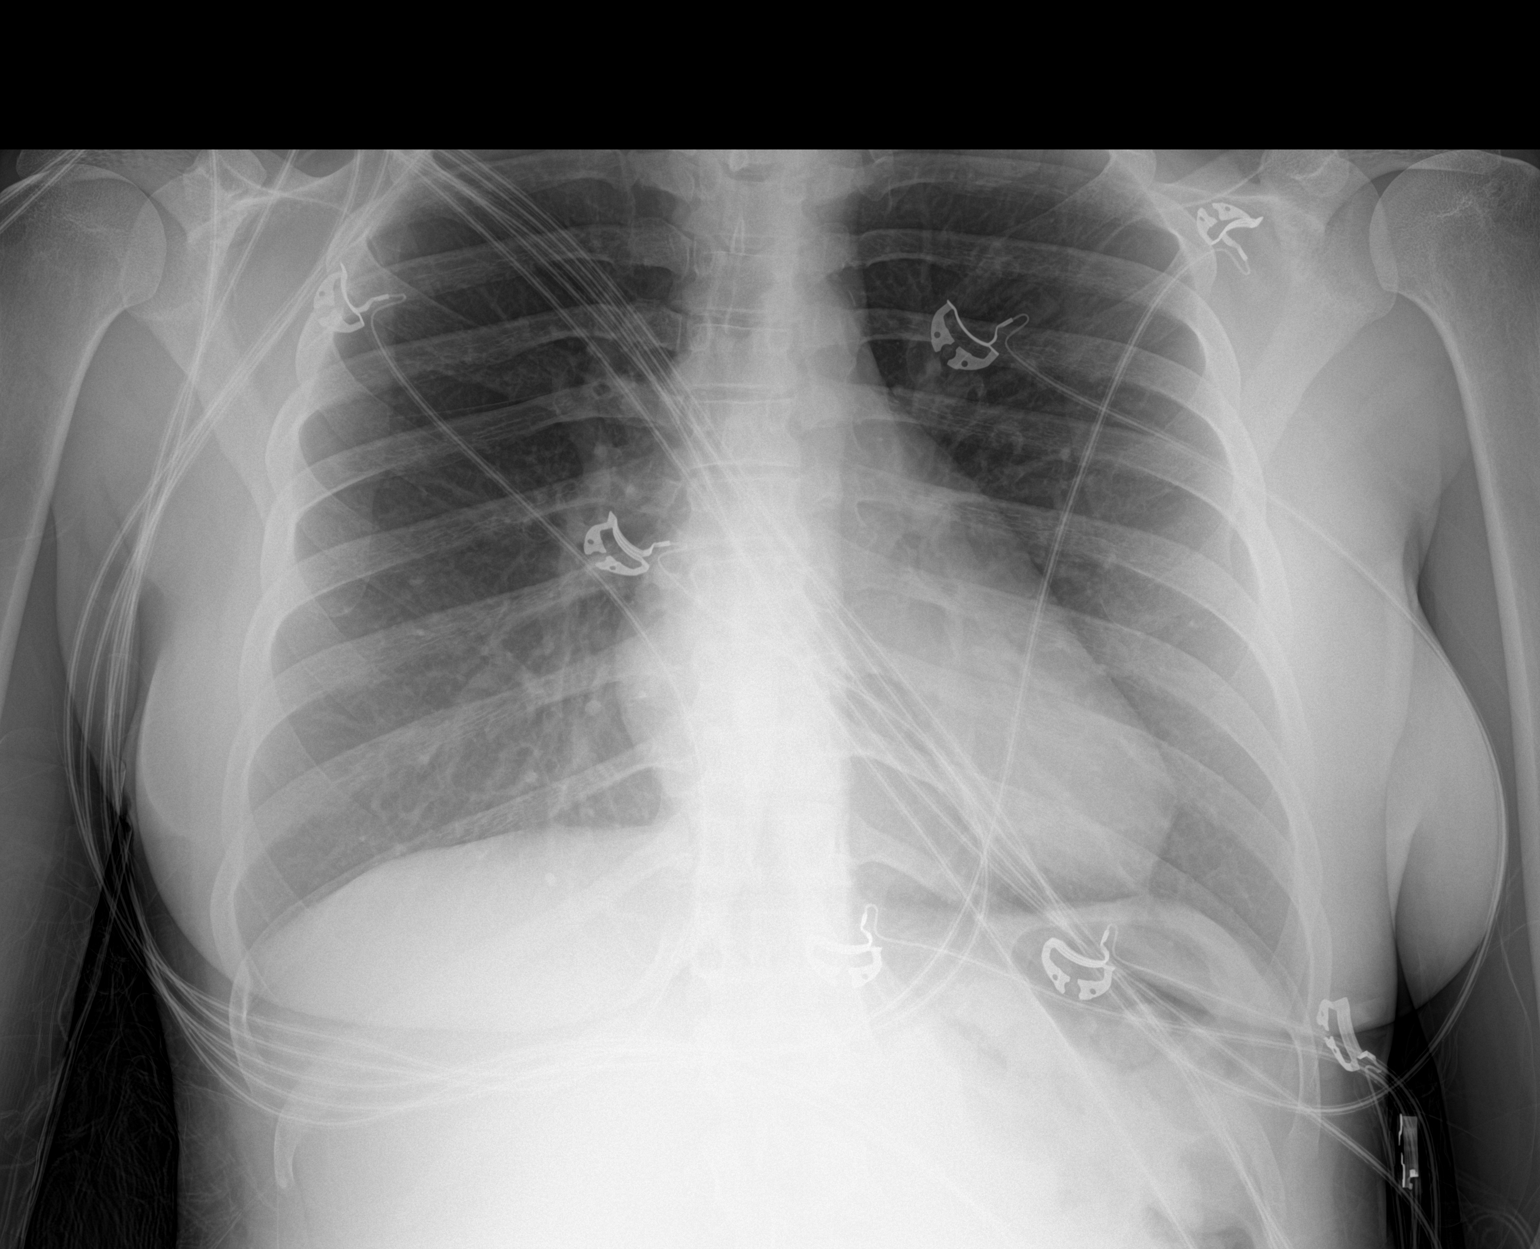

[1 of 1 positions shown; findings below may reference images not displayed]

FINDINGS: The cardiomediastinal contours are within normal limits. The lungs
are clear. No pneumothorax or pleural effusion. No acute finding in
the visualized skeleton.
IMPRESSION: No active disease.

## 2022-04-01 ENCOUNTER — Ambulatory Visit: Payer: Self-pay | Attending: Cardiology | Admitting: Cardiology

## 2022-04-01 NOTE — Progress Notes (Deleted)
Cardiology Office Note:    Date:  04/01/2022   ID:  Susan, Sexton 15-Sep-1985, MRN [id number]  PCP:  Gildardo Pounds, NP  Cardiologist:  Donato Heinz, MD  Electrophysiologist:  None   Referring MD: Gildardo Pounds, NP   No chief complaint on file.    History of Present Illness:    Susan Sexton is a 37 y.o. female with a hx of CAD, hyperlipidemia, tobacco use who presents for follow-up.  She was admitted to [facility] on 05/04/2020 with NSTEMI.  She had presented to [place] ED with chest pain.  Found to have mildly elevated troponins (peak 156).  Despite her age, she was noted to have significant CAD risk factors as suspected FH given LDL to 75 and family history including father having CABG in his 23s.  Echocardiogram was done on 05/05/2020 which showed normal biventricular function, no significant valvular disease.  LHC on 05/05/2020 showed 99% proximal RCA stenosis, 50% proximal to mid RCA, 20% distal RCA, 40% proximal to mid LCx, 80% proximal to mid LAD.  RCA disease was treated with DES.  Staged PCI to the LAD was done on 05/23/2020.  Underwent DES x2 to proximal/mid LAD.  This was complicated by likely guide catheter dissection of the left main, which was treated with DES.  Since last clinic visit,  ***Recommended Plavix monotherapy after 1 year of DAPT she reports that she has been doing well.  Denies any chest pain, dyspnea, syncope, lower extremity edema, or palpitations.  Does report occasional lightheadedness when she stands too fast.  She has been taking her aspirin and Brilinta, denies any bleeding issues.  She started smoking again, currently smoking a couple cigarettes per day.   Past Medical History:  Diagnosis Date   Asthma    Coronary artery disease    High cholesterol    Hyperlipidemia LDL goal <100 05/04/2020   NSTEMI (non-ST elevated myocardial infarction) [facility])     Past Surgical History:  Procedure Laterality Date   BREAST LUMPECTOMY Left  06/6/219   CARDIAC CATHETERIZATION     CORONARY STENT INTERVENTION N/A 05/05/2020   Procedure: CORONARY STENT INTERVENTION;  Surgeon: Wellington Hampshire, MD;  Location: [place] [facility];  Service: Cardiovascular;  Laterality: N/A;   CORONARY STENT INTERVENTION N/A 05/23/2020   Procedure: CORONARY STENT INTERVENTION;  Surgeon: Jettie Booze, MD;  Location: [place] [facility];  Service: Cardiovascular;  Laterality: N/A;   CORONARY STENT PLACEMENT  05/05/2020   INCISION AND DRAINAGE BREAST ABSCESS Left 09/2017   INTRAVASCULAR ULTRASOUND/IVUS N/A 05/23/2020   Procedure: Intravascular Ultrasound/IVUS;  Surgeon: Jettie Booze, MD;  Location: [place] [facility];  Service: Cardiovascular;  Laterality: N/A;   LEFT HEART CATH AND CORONARY ANGIOGRAPHY N/A 05/05/2020   Procedure: LEFT HEART CATH AND CORONARY ANGIOGRAPHY;  Surgeon: Wellington Hampshire, MD;  Location: [place] [facility];  Service: Cardiovascular;  Laterality: N/A;   LEFT HEART CATH AND CORONARY ANGIOGRAPHY N/A 05/23/2020   Procedure: LEFT HEART CATH AND CORONARY ANGIOGRAPHY;  Surgeon: Jettie Booze, MD;  Location: [place] [facility];  Service: Cardiovascular;  Laterality: N/A;    Current Medications: No outpatient medications have been marked as taking for the 04/01/22 encounter (Appointment) with Donato Heinz, MD.     Allergies:   Patient has no known allergies.   Social History   Socioeconomic History   Marital status: Single    Spouse name: Not on file   Number  of children: Not on file   Years of education: 11   Highest education level: 11th grade  Occupational History   Not on file  Tobacco Use   Smoking status: Former    Packs/day: 0.50    Types: Cigarettes    Quit date: 05/06/2020    Years since quitting: 1.9   Smokeless tobacco: Never   Tobacco comments:    Tuere was given 1-800-quit-now. Keimya says that she is smoking 1 cigarette a week  Vaping Use   Vaping Use: Former   Quit date:  05/06/2020  Substance and Sexual Activity   Alcohol use: No   Drug use: No   Sexual activity: Yes    Birth control/protection: None  Other Topics Concern   Not on file  Social History Narrative   Not on file   Social Determinants of Health   Financial Resource Strain: Medium Risk (02/26/2022)   Overall Financial Resource Strain (CARDIA)    Difficulty of Paying Living Expenses: Somewhat hard  Food Insecurity: Food Insecurity Present (02/26/2022)   Hunger Vital Sign    Worried About Running Out of Food in the Last Year: Sometimes true    Ran Out of Food in the Last Year: Sometimes true  Transportation Needs: No Transportation Needs (02/26/2022)   PRAPARE - Hydrologist (Medical): No    Lack of Transportation (Non-Medical): No  Physical Activity: Not on file  Stress: Stress Concern Present (10/19/2021)   [place]    Feeling of Stress : Rather much  Social Connections: Not on file     Family History: The patient's family history includes Heart attack (age of onset: 35) in her father; Hypertension in her mother.  ROS:   Please see the history of present illness.  (+) Lightheadedness (+) Near-syncope (+) Minor chest pain (+) Painful/burning urination All other systems reviewed and are negative.  EKGs/Labs/Other Studies Reviewed:    The following studies were reviewed today:   EKG:   05/17/2020: normal sinus rhythm, rate 91, no ST/T abnormalities 07/10/2020: normal sinus rhythm, rate 66 bpm, no ST/T abnormalities   Recent Labs: 07/10/2021: BUN 10; Creatinine, Ser 0.76; Hemoglobin 12.7; Platelets 249; Potassium 3.7; Sodium 137  Recent Lipid Panel    Component Value Date/Time   CHOL 212 (H) 06/14/2021 1109   TRIG 39 06/14/2021 1109   HDL 48 06/14/2021 1109   CHOLHDL 4.4 06/14/2021 1109   CHOLHDL 4.7 05/05/2020 0012   VLDL 10 05/05/2020 0012   LDLCALC 157 (H) 06/14/2021 1109     Physical Exam:    VS:  There were no vitals taken for this visit.    Wt Readings from Last 3 Encounters:  12/17/21 153 lb (69.4 kg)  10/19/21 154 lb 12.8 oz (70.2 kg)  07/26/21 163 lb (73.9 kg)     GEN:  Well nourished, well developed in no acute distress HEENT: Normal NECK: No JVD; No carotid bruits CARDIAC: RRR, no murmurs, rubs, gallops RESPIRATORY:  Clear to auscultation without rales, wheezing or rhonchi  ABDOMEN: Soft, non-tender, non-distended MUSCULOSKELETAL:  No edema; No deformity  SKIN: Warm and dry NEUROLOGIC:  Alert and oriented x 3 PSYCHIATRIC:  Normal affect   ASSESSMENT:    No diagnosis found.   PLAN:    CAD: Presented with NSTEMI 05/04/2020.  Echocardiogram was done on 05/05/2020 which showed normal biventricular function, no significant valvular disease.  LHC on 05/05/2020 showed 99% proximal RCA stenosis, 50% proximal  to mid RCA, 20% distal RCA, 40% proximal to mid LCx, 80% proximal to mid LAD.  RCA disease was treated with DES.  Staged PCI to the LAD was done on 05/23/2020.  Underwent DES x2 to proximal/mid LAD.  This was complicated by likely guide catheter dissection of the left main, which was treated with DES. -Continue aspirin 81 mg daily, ticagrelor 90 mg twice daily -Continue atorvastatin 80 mg daily.  Started on Repatha 09/2020 but has not filled prescription yet, she is waiting on the results of her Medicaid application -Continue Toprol-XL 12.5 mg daily  Hyperlipidemia: Suspect FH given LDL 282 in 01/2020 and family history including father had CABG in 22s.  Was started on atorvastatin 20 mg daily with improvement in LDL to 171 on 2/25.  Atorvastatin dose increased to 80 mg daily at that time.  LDL 169 on 07/10/2020, referred to lipid clinic for PCSK9 inhibitor, started on Repatha 09/2020 but has not filled prescription yet, she is waiting on the results of her Medicaid application  Lightheadedness: Suspect due to medication use in setting of soft blood  pressures.  Discontinued losartan.  Continue low-dose metoprolol as above  Tobacco use: Smoked 1 pack/day x 15 years.  Quit smoking following her stent but has started smoking again, currently smoking a couple cigarettes per day.  Counseled on the risk of tobacco use and cessation strongly encouraged.  RTC in 6 months  Medication Adjustments/Labs and Tests Ordered: Current medicines are reviewed at length with the patient today.  Concerns regarding medicines are outlined above.  No orders of the defined types were placed in this encounter.   No orders of the defined types were placed in this encounter.    There are no Patient Instructions on file for this visit.     Signed, Donato Heinz, MD  04/01/2022 6:45 AM    [place]

## 2022-04-03 ENCOUNTER — Telehealth: Payer: Self-pay | Admitting: Licensed Clinical Social Worker

## 2022-04-03 NOTE — Telephone Encounter (Signed)
H&V Care Navigation CSW Progress Note  Clinical Social Worker  received email from pt  to f/u on patient care fund request for rental assistance. Inquired if check had been received. I have contacted pt landlord to see if check received as per email it had not been posted. I also reminded pt to reschedule missed appt from this week with Dr. Gardiner Rhyme.   Patient is participating in a Managed Medicaid Plan:  No, BCBS commercial plan only.   SDOH Screenings   Food Insecurity: Food Insecurity Present (02/26/2022)  Housing: Medium Risk (02/26/2022)  Transportation Needs: No Transportation Needs (02/26/2022)  Utilities: Not At Risk (02/26/2022)  Depression (PHQ2-9): Medium Risk (07/26/2021)  Financial Resource Strain: Medium Risk (02/26/2022)  Stress: Stress Concern Present (10/19/2021)  Tobacco Use: Medium Risk (12/17/2021)   Westley Hummer, MSW, [place]  [phone]- work cell phone (preferred) [phone]- desk phone

## 2022-04-05 ENCOUNTER — Telehealth: Payer: Self-pay | Admitting: Licensed Clinical Social Worker

## 2022-04-05 NOTE — Telephone Encounter (Signed)
H&V Care Navigation CSW Progress Note  Clinical Social Worker contacted patient by phone to f/u on voicemail left by pt after clinic hours on 1/25. Reached pt at [phone]. She shares that she was able to confirm with her landlord (as was I) that the rental assistance was received. I discussed other options for assistance available in the community. Pt unfortunately had applied for an alternate apartment more in her budget but due to credit score she wasn't able to be approved. She is working on applying for another one- I discussed there are credit counseling services available in community that may be able to assist her with how to improve this and work on housing search with her current score. Provided her with number via text for Credit Counseling Services at [place]. Pt shares that she will call them, I also have asked her to please reschedule her missed appt with Dr. Newman Nickels team and provided her with that number. I reminded her that I also had asked for updated emergency contact number for her sister, she states she will provide me with that information but still has not done so at this time. I remain available for assistance but it is important that pt re-engage and participate in medical appts for ongoing care navigation support.    Patient is participating in a Managed Medicaid Plan:  No, BCBS commercial plan  [place]: Food Insecurity Present (02/26/2022)  Housing: Medium Risk (02/26/2022)  Transportation Needs: No Transportation Needs (02/26/2022)  Utilities: Not At Risk (02/26/2022)  Depression (PHQ2-9): Medium Risk (07/26/2021)  Financial Resource Strain: Medium Risk (02/26/2022)  Stress: Stress Concern Present (10/19/2021)  Tobacco Use: Medium Risk (12/17/2021)    Westley Hummer, MSW, [place]  [phone]- work cell phone (preferred) [phone]- desk  phone

## 2022-04-08 ENCOUNTER — Other Ambulatory Visit: Payer: Self-pay

## 2022-04-09 NOTE — Progress Notes (Unsigned)
Cardiology Clinic Note   Patient Name: Susan Sexton Date of Encounter: 04/11/2022  Primary Care Provider:  Gildardo Pounds, NP Primary Cardiologist:  Donato Heinz, MD  Patient Profile    Susan Sexton 37 year old female presents to the clinic today for follow-up evaluation of her coronary artery disease.  Past Medical History    Past Medical History:  Diagnosis Date   Asthma    Coronary artery disease    High cholesterol    Hyperlipidemia LDL goal <100 05/04/2020   NSTEMI (non-ST elevated myocardial infarction) [facility])    Past Surgical History:  Procedure Laterality Date   BREAST LUMPECTOMY Left 06/6/219   CARDIAC CATHETERIZATION     CORONARY STENT INTERVENTION N/A 05/05/2020   Procedure: CORONARY STENT INTERVENTION;  Surgeon: Wellington Hampshire, MD;  Location: [place] [facility];  Service: Cardiovascular;  Laterality: N/A;   CORONARY STENT INTERVENTION N/A 05/23/2020   Procedure: CORONARY STENT INTERVENTION;  Surgeon: Jettie Booze, MD;  Location: [place] [facility];  Service: Cardiovascular;  Laterality: N/A;   CORONARY STENT PLACEMENT  05/05/2020   INCISION AND DRAINAGE BREAST ABSCESS Left 09/2017   INTRAVASCULAR ULTRASOUND/IVUS N/A 05/23/2020   Procedure: Intravascular Ultrasound/IVUS;  Surgeon: Jettie Booze, MD;  Location: [place] [facility];  Service: Cardiovascular;  Laterality: N/A;   LEFT HEART CATH AND CORONARY ANGIOGRAPHY N/A 05/05/2020   Procedure: LEFT HEART CATH AND CORONARY ANGIOGRAPHY;  Surgeon: Wellington Hampshire, MD;  Location: [place] [facility];  Service: Cardiovascular;  Laterality: N/A;   LEFT HEART CATH AND CORONARY ANGIOGRAPHY N/A 05/23/2020   Procedure: LEFT HEART CATH AND CORONARY ANGIOGRAPHY;  Surgeon: Jettie Booze, MD;  Location: [place] [facility];  Service: Cardiovascular;  Laterality: N/A;    Allergies  No Known Allergies  History of Present Illness    Susan Sexton is a PMH of HTN, hypercholesterolemia,  coronary artery disease status post PCI with DES, and situational anxiety.  She was admitted to [facility] 05/04/2020 with NSTEMI.  She had previously presented to the [facility] with chest discomfort.  She was found to have mildly elevated troponins.  She reported significant family history of coronary disease with father having CABG in his 9s.  Her echocardiogram 2/22 showed normal biventricular function and no significant valvular abnormalities.  She underwent cardiac catheterization 05/05/2020 which showed 99% proximal RCA stenosis, 50% proximal-mid RCA, 20% distal RCA, 40% proximal-mid circumflex, 80% proximal-mid LAD.  She received PCI with DES to her RCA.  She underwent staged PCI to her LAD 05/23/2020.  She received DES x 2 to her proximal/mid LAD.  She was placed on aspirin and Brilinta with recommendations to continue for 1 year.  She was seen in follow-up by Bunnie Domino, DNP on 10/19/2021.  She had presented to the emergency department with recurrent chest pain which was found to be related to stress.  She reported blurred vision and labile blood pressures at home.  She was medically compliant.  She reported her blood pressure would be elevated in the morning and she noted headache with elevated pressures.  She denied dizziness and palpitations.  She also noted chest pressure when she would become anxious  about family members.  A social work consult was placed to help with housing issues.  She continued to follow-up with community health and wellness for her prescriptions and lab work.  Her metoprolol was moved to p.m. dosing.  She presents to the clinic today  for follow-up evaluation and states she has been working with social work for assistance with her expenses.  She has been approved for Repatha and has started taking the injections.  She reports that eating healthy is more expensive.  We reviewed her cardiac catheterization and lipid panel.  She expressed  understanding.  She does note some increased stress and anxiety surrounding her living situation.  We discussed mindfulness stress reduction and I will give her the stress reduction sheet.  I will also refer her to Renaee Munda our health coach for techniques on stress reduction.  We will repeat fasting lipids and LFTs in April and plan follow-up in 9 to 12 months.  Today she denies chest pain, shortness of breath, lower extremity edema, fatigue, palpitations, melena, hematuria, hemoptysis, diaphoresis, weakness, presyncope, syncope, orthopnea, and PND.    Home Medications    Prior to Admission medications   Medication Sig Start Date End Date Taking? Authorizing Provider  acetaminophen (TYLENOL) 500 MG tablet Take 500-1,000 mg by mouth every 6 (six) hours as needed (for pain.).    [provider]  albuterol (VENTOLIN HFA) 108 (90 Base) MCG/ACT inhaler Inhale 2 puffs into the lungs every 6 (six) hours as needed for wheezing or shortness of breath. NEEDS PASS 07/24/20   Claiborne Rigg, NP  aspirin 81 MG chewable tablet Chew 1 tablet (81 mg total) by mouth daily. 04/16/21   Little Ishikawa, MD  atorvastatin (LIPITOR) 80 MG tablet Take 1 tablet (80 mg total) by mouth daily. 07/12/21   Little Ishikawa, MD  Evolocumab (REPATHA SURECLICK) 140 MG/ML SOAJ Inject 140 mg into the skin every 14 (fourteen) days. 01/08/22   Little Ishikawa, MD  ezetimibe (ZETIA) 10 MG tablet Take 1 tablet (10 mg total) by mouth daily. 07/12/21   Little Ishikawa, MD  fluconazole (DIFLUCAN) 150 MG tablet Take 1 tablet PO once. Repeat in 3 days if needed. 02/13/22   Waldon Merl, PA-C  metoprolol succinate (TOPROL-XL) 25 MG 24 hr tablet Take 1/2 tablet (12.5 mg total) by mouth daily. Patient taking differently: Take 12.5 mg by mouth daily. In The Evening 07/12/21   Little Ishikawa, MD  Multiple Vitamins-Minerals (MULTI ADULT GUMMIES PO) Take 1 tablet by mouth daily.    [provider]  nitroGLYCERIN (NITROSTAT) 0.4 MG SL tablet Place 1 tablet (0.4 mg total) under the tongue every 5 (five) minutes x 3 doses as needed for chest pain. 09/20/21   Little Ishikawa, MD  ticagrelor (BRILINTA) 90 MG TABS tablet Take 1 tablet (90 mg total) by mouth 2 (two) times daily. 07/12/21   Little Ishikawa, MD    Family History    Family History  Problem Relation Age of Onset   Hypertension Mother    Heart attack Father 33       Had CABG, but died at age 40 after trauma   She indicated that her mother is alive. She indicated that her father is deceased.  Social History    Social History   Socioeconomic History   Marital status: Single    Spouse name: Not on file   Number of children: Not on file   Years of education: 11   Highest education level: 11th grade  Occupational History   Not on file  Tobacco Use   Smoking status: Former    Packs/day: 0.50    Types: Cigarettes    Quit date: 05/06/2020    Years since quitting: 1.9  Smokeless tobacco: Never   Tobacco comments:    [place] was given 1-800-quit-now. Adriena says that she is smoking 1 cigarette a week  Vaping Use   Vaping Use: Former   Quit date: 05/06/2020  Substance and Sexual Activity   Alcohol use: No   Drug use: No   Sexual activity: Yes    Birth control/protection: None  Other Topics Concern   Not on file  Social History Narrative   Not on file   Social Determinants of Health   Financial Resource Strain: Medium Risk (02/26/2022)   Overall Financial Resource Strain (CARDIA)    Difficulty of Paying Living Expenses: Somewhat hard  Food Insecurity: Food Insecurity Present (02/26/2022)   Hunger Vital Sign    Worried About Running Out of Food in the Last Year: Sometimes true    Ran Out of Food in the Last Year: Sometimes true  Transportation Needs: No Transportation Needs (02/26/2022)   PRAPARE - Hydrologist (Medical): No    Lack of Transportation (Non-Medical):  No  Physical Activity: Not on file  Stress: Stress Concern Present (10/19/2021)   [place]    Feeling of Stress : Rather much  Social Connections: Not on file  Intimate Partner Violence: Not on file     Review of Systems    General:  No chills, fever, night sweats or weight changes.  Cardiovascular:  No chest pain, dyspnea on exertion, edema, orthopnea, palpitations, paroxysmal nocturnal dyspnea. Dermatological: No rash, lesions/masses Respiratory: No cough, dyspnea Urologic: No hematuria, dysuria Abdominal:   No nausea, vomiting, diarrhea, bright red blood per rectum, melena, or hematemesis Neurologic:  No visual changes, wkns, changes in mental status. All other systems reviewed and are otherwise negative except as noted above.  Physical Exam    VS:  BP 118/72   Pulse 64   Ht 5\' 7"  (1.702 m)   Wt 157 lb 3.2 oz (71.3 kg)   SpO2 98%   BMI 24.62 kg/m  , BMI Body mass index is 24.62 kg/m. GEN: Well nourished, well developed, in no acute distress. HEENT: normal. Neck: Supple, no JVD, carotid bruits, or masses. Cardiac: RRR, no murmurs, rubs, or gallops. No clubbing, cyanosis, edema.  Radials/DP/PT 2+ and equal bilaterally.  Respiratory:  Respirations regular and unlabored, clear to auscultation bilaterally. GI: Soft, nontender, nondistended, BS + x 4. MS: no deformity or atrophy. Skin: warm and dry, no rash. Neuro:  Strength and sensation are intact. Psych: Normal affect.  Accessory Clinical Findings    Recent Labs: 07/10/2021: BUN 10; Creatinine, Ser 0.76; Hemoglobin 12.7; Platelets 249; Potassium 3.7; Sodium 137   Recent Lipid Panel    Component Value Date/Time   CHOL 212 (H) 06/14/2021 1109   TRIG 39 06/14/2021 1109   HDL 48 06/14/2021 1109   CHOLHDL 4.4 06/14/2021 1109   CHOLHDL 4.7 05/05/2020 0012   VLDL 10 05/05/2020 0012   LDLCALC 157 (H) 06/14/2021 1109         ECG personally reviewed by  me today-normal sinus rhythm 64 bpm no ectopy no acute changes.  Echocardiogram 05/05/2020 IMPRESSIONS     1. Left ventricular ejection fraction, by estimation, is 60 to 65%. The  left ventricle has normal function. The left ventricle has no regional  wall motion abnormalities. There is mild left ventricular hypertrophy.  Left ventricular diastolic parameters  were normal.   2. Right ventricular systolic function is normal. The right ventricular  size  is normal. There is normal pulmonary artery systolic pressure. The  estimated right ventricular systolic pressure is 59.5 mmHg.   3. Right atrial size was mildly dilated.   4. The mitral valve is normal in structure. No evidence of mitral valve  regurgitation. No evidence of mitral stenosis.   5. The aortic valve is normal in structure. Aortic valve regurgitation is  not visualized. No aortic stenosis is present.   6. The inferior vena cava is dilated in size with <50% respiratory  variability, suggesting right atrial pressure of 15 mmHg.   FINDINGS   Left Ventricle: Left ventricular ejection fraction, by estimation, is 60  to 65%. The left ventricle has normal function. The left ventricle has no  regional wall motion abnormalities. The left ventricular internal cavity  size was normal in size. There is   mild left ventricular hypertrophy. Left ventricular diastolic parameters  were normal.   Right Ventricle: The right ventricular size is normal. No increase in  right ventricular wall thickness. Right ventricular systolic function is  normal. There is normal pulmonary artery systolic pressure. The tricuspid  regurgitant velocity is 2.28 m/s, and   with an assumed right atrial pressure of 15 mmHg, the estimated right  ventricular systolic pressure is 63.8 mmHg.   Left Atrium: Left atrial size was normal in size.   Right Atrium: Right atrial size was mildly dilated.   Pericardium: There is no evidence of pericardial effusion.    Mitral Valve: The mitral valve is normal in structure. No evidence of  mitral valve regurgitation. No evidence of mitral valve stenosis. MV peak  gradient, 4.2 mmHg. The mean mitral valve gradient is 2.0 mmHg.   Tricuspid Valve: The tricuspid valve is normal in structure. Tricuspid  valve regurgitation is mild . No evidence of tricuspid stenosis.   Aortic Valve: The aortic valve is normal in structure. Aortic valve  regurgitation is not visualized. No aortic stenosis is present. Aortic  valve mean gradient measures 4.0 mmHg. Aortic valve peak gradient measures  7.7 mmHg. Aortic valve area, by VTI  measures 2.13 cm.   Pulmonic Valve: The pulmonic valve was normal in structure. Pulmonic valve  regurgitation is trivial. No evidence of pulmonic stenosis.   Aorta: The aortic root is normal in size and structure.   Venous: The inferior vena cava is dilated in size with less than 50%  respiratory variability, suggesting right atrial pressure of 15 mmHg.   IAS/Shunts: No atrial level shunt detected by color flow Doppler.    Cardiac catheterization 05/23/2020  Prox Cx to Mid Cx lesion is 40% stenosed. Dist RCA lesion is 20% stenosed. Previously placed Prox RCA drug eluting stent is widely patent. Previously placed Prox RCA to Mid RCA drug eluting stent is widely patent. Prox LAD to Mid LAD lesion is 80% stenosed. A drug-eluting stent was successfully placed using a STENT RESOLUTE ONYX 3.5X30, postdilated to 3.75 mm and optimized with intravascular ultrasound. Post intervention, there is a 0% residual stenosis. Mid LAD lesion is 75% stenosed in a very tortuous vessel, at the distal edge of the prior stent, likely representing edge dissection. A drug-eluting stent was successfully placed using a STENT RESOLUTE ONYX 3.5X8., Postdilated to 3.75 mm and optimized with intravascular ultrasound. Post intervention, there is a 0% residual stenosis. 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 A drug-eluting stent was successfully placed using a STENT RESOLUTE ONYX 3.5X12, postdilated to greater than 4 mm in diameter and optimized with intravascular ultrasound.  Post intervention, there is a 0% residual stenosis. LV end diastolic pressure is normal. There is no aortic valve stenosis.   Continue dual antiplatelet therapy for 12 months.  Given the length of stented segments in both her LAD, left main and RCA, would continue clopidogrel monotherapy after the 1 year of dual antiplatelet therapy.  She needs aggressive secondary prevention.  We talked about smoking cessation as well.  Assessment & Plan   1.  Coronary artery disease-status post NSTEMI with staged PCI and DES to her RCA and DES x 2 to her proximal and mid LAD.  Denies chest pain today.  No recent episodes of chest discomfort. Continue metoprolol, Repatha, ezetimibe, Brilinta Heart healthy low-sodium diet-salty 6 given Increase physical activity as tolerated  Essential hypertension-BP today 118/72.  Continue metoprolol Maintain blood pressure log  Hyperlipidemia-06/14/2021: Cholesterol, Total 212; HDL 48; LDL Chol Calc (NIH) 157; Triglycerides 39 Continue aspirin, atorvastatin, ezetimibe, Repatha Heart healthy low-sodium high-fiber diet.   Increase physical activity as tolerated Plan for repeat fasting lipids and LFTs 4/24  Anxiety-continues to note situational anxiety. Refer to [employer] health coach for stress reduction techniques Mindfulness stress reduction sheet given  Disposition: Follow-up with Dr. Bjorn Pippin or me in 9-12 months.   Susan Ripple. Areli Frary NP-C     04/11/2022, 11:10 AM [facility] [address] Office [phone] Fax [phone]    I spent 14 minutes examining this patient, reviewing medications, and using patient centered shared decision making involving her cardiac care.  Prior to her visit I spent greater than 20 minutes reviewing her  past medical history,  medications, and prior cardiac tests.

## 2022-04-11 ENCOUNTER — Encounter: Payer: Self-pay | Admitting: General Practice

## 2022-04-11 ENCOUNTER — Ambulatory Visit: Payer: No Typology Code available for payment source | Attending: Cardiology | Admitting: General Practice

## 2022-04-11 ENCOUNTER — Telehealth: Payer: Self-pay

## 2022-04-11 VITALS — BP 118/72 | HR 64 | Ht 67.0 in | Wt 157.2 lb

## 2022-04-11 DIAGNOSIS — F418 Other specified anxiety disorders: Secondary | ICD-10-CM

## 2022-04-11 DIAGNOSIS — I10 Essential (primary) hypertension: Secondary | ICD-10-CM

## 2022-04-11 DIAGNOSIS — Z0000 Encounter for general adult medical examination without abnormal findings: Secondary | ICD-10-CM

## 2022-04-11 DIAGNOSIS — E7800 Pure hypercholesterolemia, unspecified: Secondary | ICD-10-CM

## 2022-04-11 DIAGNOSIS — I2510 Atherosclerotic heart disease of native coronary artery without angina pectoris: Secondary | ICD-10-CM

## 2022-04-11 NOTE — Telephone Encounter (Signed)
Called patient per referral for health coaching regarding stress management. Also discuss stress around smoking habit and wanting to quit smoking again. Patient is interested in participating in health coaching and has been scheduled for a phone session on 04/16/22 at 5:00pm. Patient will be called at that time.   Avelino Leeds, MS, ERHD, [facility]  Care Guide, Health & Wellness Coach [address], [address] [place] [address] Telephone: [phone] Email: [email or web address]

## 2022-04-11 NOTE — Patient Instructions (Addendum)
Medication Instructions:  The current medical regimen is effective;  continue present plan and medications as directed. Please refer to the Current Medication list given to you today.  *If you need a refill on your cardiac medications before your next appointment, please call your pharmacy*  Lab Work: FASTING LIPID AND LFT IN APRIL If you have labs (blood work) drawn today and your tests are completely normal, you will receive your results only by: [place] (if you have MyChart) OR A paper copy in the mail If you have any lab test that is abnormal or we need to change your treatment, we will call you to review the results.  Testing/Procedures: none  Follow-Up: At [facility], you and your health needs are our priority.  As part of our continuing mission to provide you with exceptional heart care, we have created designated Provider Care Teams.  These Care Teams include your primary Cardiologist (physician) and Advanced Practice Providers (APPs -  Physician Assistants and Nurse Practitioners) who all work together to provide you with the care you need, when you need it.  We recommend signing up for the patient portal called "MyChart".  Sign up information is provided on this After Visit Summary.  MyChart is used to connect with patients for Virtual Visits (Telemedicine).  Patients are able to view lab/test results, encounter notes, upcoming appointments, etc.  Non-urgent messages can be sent to your provider as well.   To learn more about what you can do with MyChart, go to [email or web address].    Your next appointment:   12 month(s)  Provider:   Donato Heinz, MD     Other Instructions Increase physical activity as tolerated  Continue fiber diet  Please read stress tips-attached    Mindfulness-Based Stress Reduction Mindfulness-based stress reduction (MBSR) is a program that helps people learn to practice mindfulness. Mindfulness is the practice of  consciously paying attention to the present moment. MBSR focuses on developing self-awareness, which lets you respond to life stress without judgment or negative feelings. It can be learned and practiced through techniques such as education, breathing exercises, meditation, and yoga. MBSR includes several mindfulness techniques in one program. MBSR works best when you understand the treatment, are willing to try new things, and can commit to spending time practicing what you learn. MBSR training may include learning about: How your feelings, thoughts, and reactions affect your body. New ways to respond to things that cause negative thoughts to start (triggers). How to notice your thoughts and let go of them. Practicing awareness of everyday things that you normally do without thinking. The techniques and goals of different types of meditation. What are the benefits of MBSR? MBSR can have many benefits, which include helping you to: Develop self-awareness. This means knowing and understanding yourself. Learn skills and attitudes that help you to take part in your own health care. Learn new ways to care for yourself. Be more accepting about how things are, and let things go. Be less judgmental and approach things with an open mind. Be patient with yourself and trust yourself more. MBSR has also been shown to: Reduce negative emotions, such as sadness, overwhelm, and worry. Improve memory and focus. Change how you sense and react to pain. Boost your body's ability to fight infections. Help you connect better with other people. Improve your sense of well-being. How to practice mindfulness To do a basic awareness exercise: Find a comfortable place to sit. Pay attention to the present moment. Notice your  thoughts, feelings, and surroundings just as they are. Avoid judging yourself, your feelings, or your surroundings. Make note of any judgment that comes up and let it go. Your mind may wander,  and that is okay. Make note of when your thoughts drift, and return your attention to the present moment. To do basic mindfulness meditation: Find a comfortable place to sit. This may include a stable chair or a firm floor cushion. Sit upright with your back straight. Let your arms fall next to your sides, with your hands resting on your legs. If you are sitting in a chair, rest your feet flat on the floor. If you are sitting on a cushion, cross your legs in front of you. Keep your head in a neutral position with your chin dropped slightly. Relax your jaw and rest the tip of your tongue on the roof of your mouth. Drop your gaze to the floor or close your eyes. Breathe normally and pay attention to your breath. Feel the air moving in and out of your nose. Feel your belly expanding and relaxing with each breath. Your mind may wander, and that is okay. Make note of when your thoughts drift, and return your attention to your breath. Avoid judging yourself, your feelings, or your surroundings. Make note of any judgment or feelings that come up, let them go, and bring your Follow these instructions at home:  Find a local in-person or online MBSR program. Set aside some time regularly for mindfulness practice. Practice every day if you can. Even 10 minutes of practice is helpful. Find a mindfulness practice that works best for you. This may include one or more of the following: Meditation. This involves focusing your mind on a certain thought or activity. Breathing awareness exercises. These help you to stay present by focusing on your breath. Body scan. For this practice, you lie down and pay attention to each part of your body from head to toe. You can identify tension and soreness and consciously relax parts of your body. Yoga. Yoga involves stretching and breathing, and it can improve your ability to move and be flexible. It can also help you to test your body's limits, which can help you release  stress. Mindful eating. This way of eating involves focusing on the taste, texture, color, and smell of each bite of food. This slows down eating and helps you feel full sooner. For this reason, it can be an important part of a weight loss plan. Find a podcast or recording that provides guidance for breathing awareness, body scan, or meditation exercises. You can listen to these any time when you have a free moment to rest without distractions. Follow your treatment plan as told by your health care provider. This may include taking regular medicines and making changes to your diet or lifestyle as recommended. Where to find more information You can find more information about MBSR from: Your health care provider. Community-based meditation centers or programs. Programs offered near you. Summary Mindfulness-based stress reduction (MBSR) is a program that teaches you how to consciously pay attention to the present moment. It is used to help you deal better with daily stress, feelings, and pain. MBSR focuses on developing self-awareness, which allows you to respond to life stress without judgment or negative feelings. MBSR programs may involve learning different mindfulness practices, such as breathing exercises, meditation, yoga, body scan, or mindful eating. Find a mindfulness practice that works best for you, and set aside time for it on a regular  basis. This information is not intended to replace advice given to you by your health care provider. Make sure you discuss any questions you have with your health care provider. Document Revised: 10/05/2020 Document Reviewed: 10/05/2020 Elsevier Patient Education  [place].

## 2022-04-16 ENCOUNTER — Other Ambulatory Visit (HOSPITAL_COMMUNITY): Payer: Self-pay

## 2022-04-16 ENCOUNTER — Ambulatory Visit: Payer: No Typology Code available for payment source | Attending: Internal Medicine

## 2022-04-16 ENCOUNTER — Telehealth: Payer: Self-pay

## 2022-04-16 DIAGNOSIS — Z0000 Encounter for general adult medical examination without abnormal findings: Secondary | ICD-10-CM

## 2022-04-16 NOTE — Telephone Encounter (Signed)
Called patient to hold health coaching session. Left patient a message to return call to reschedule.    Avelino Leeds, MS, ERHD, [facility]  Care Guide, Health & Wellness Coach [address] [place] [address] Telephone: [phone] Email: [email or web address]

## 2022-04-23 NOTE — Telephone Encounter (Signed)
H&V Care Navigation CSW Progress Note  Clinical Social Worker  mailed pt her signed Patient [place] Agreement  to keep for her records.  Patient is participating in a Managed Medicaid Plan:  No, [employer] commercial insurance now currently listed  [place]: Food Insecurity Present (02/26/2022)  Housing: Medium Risk (02/26/2022)  Transportation Needs: No Transportation Needs (02/26/2022)  Utilities: Not At Risk (02/26/2022)  Depression (PHQ2-9): Medium Risk (07/26/2021)  Financial Resource Strain: Medium Risk (02/26/2022)  Stress: Stress Concern Present (10/19/2021)  Tobacco Use: Medium Risk (04/11/2022)   Westley Hummer, MSW, [place]  [phone]- work cell phone (preferred) [phone]- desk phone

## 2022-05-04 ENCOUNTER — Emergency Department (HOSPITAL_BASED_OUTPATIENT_CLINIC_OR_DEPARTMENT_OTHER)
Admission: EM | Admit: 2022-05-04 | Discharge: 2022-05-04 | Disposition: A | Discharge status: Home / Self Care | Payer: No Typology Code available for payment source | Attending: Emergency Medicine | Admitting: Emergency Medicine

## 2022-05-04 ENCOUNTER — Other Ambulatory Visit: Payer: Self-pay

## 2022-05-04 ENCOUNTER — Emergency Department (HOSPITAL_BASED_OUTPATIENT_CLINIC_OR_DEPARTMENT_OTHER): Payer: No Typology Code available for payment source

## 2022-05-04 DIAGNOSIS — I2510 Atherosclerotic heart disease of native coronary artery without angina pectoris: Secondary | ICD-10-CM | POA: Insufficient documentation

## 2022-05-04 DIAGNOSIS — Z20822 Contact with and (suspected) exposure to covid-19: Secondary | ICD-10-CM | POA: Diagnosis not present

## 2022-05-04 DIAGNOSIS — R079 Chest pain, unspecified: Secondary | ICD-10-CM | POA: Diagnosis present

## 2022-05-04 DIAGNOSIS — R072 Precordial pain: Secondary | ICD-10-CM | POA: Diagnosis not present

## 2022-05-04 DIAGNOSIS — J45909 Unspecified asthma, uncomplicated: Secondary | ICD-10-CM | POA: Insufficient documentation

## 2022-05-04 LAB — CBC WITH DIFFERENTIAL/PLATELET
Abs Immature Granulocytes: 0.02 10*3/uL (ref 0.00–0.07)
Basophils Absolute: 0 10*3/uL (ref 0.0–0.1)
Basophils Relative: 0 %
Eosinophils Absolute: 0.2 10*3/uL (ref 0.0–0.5)
Eosinophils Relative: 2 %
HCT: 36.3 % (ref 36.0–46.0)
Hemoglobin: 12.3 g/dL (ref 12.0–15.0)
Immature Granulocytes: 0 %
Lymphocytes Relative: 37 %
Lymphs Abs: 3.8 10*3/uL (ref 0.7–4.0)
MCH: 31.1 pg (ref 26.0–34.0)
MCHC: 33.9 g/dL (ref 30.0–36.0)
MCV: 91.9 fL (ref 80.0–100.0)
Monocytes Absolute: 1.1 10*3/uL — ABNORMAL HIGH (ref 0.1–1.0)
Monocytes Relative: 10 %
Neutro Abs: 5.3 10*3/uL (ref 1.7–7.7)
Neutrophils Relative %: 51 %
Platelets: 211 10*3/uL (ref 150–400)
RBC: 3.95 MIL/uL (ref 3.87–5.11)
RDW: 12.9 % (ref 11.5–15.5)
WBC: 10.3 10*3/uL (ref 4.0–10.5)
nRBC: 0 % (ref 0.0–0.2)

## 2022-05-04 LAB — COMPREHENSIVE METABOLIC PANEL
ALT: 25 U/L (ref 0–44)
AST: 64 U/L — ABNORMAL HIGH (ref 15–41)
Albumin: 4.3 g/dL (ref 3.5–5.0)
Alkaline Phosphatase: 85 U/L (ref 38–126)
Anion gap: 7 (ref 5–15)
BUN: 18 mg/dL (ref 6–20)
CO2: 24 mmol/L (ref 22–32)
Calcium: 9.1 mg/dL (ref 8.9–10.3)
Chloride: 104 mmol/L (ref 98–111)
Creatinine, Ser: 0.72 mg/dL (ref 0.44–1.00)
GFR, Estimated: >60 mL/min (ref >60)
Glucose, Bld: 123 mg/dL — ABNORMAL HIGH (ref 70–99)
Potassium: 3.7 mmol/L (ref 3.5–5.1)
Sodium: 135 mmol/L (ref 135–145)
Total Bilirubin: 0.6 mg/dL (ref 0.3–1.2)
Total Protein: 7.7 g/dL (ref 6.5–8.1)

## 2022-05-04 LAB — RESP PANEL BY RT-PCR (RSV, FLU A&B, COVID)  RVPGX2
Influenza A by PCR: NEGATIVE
Influenza B by PCR: NEGATIVE
Resp Syncytial Virus by PCR: NEGATIVE
SARS Coronavirus 2 by RT PCR: NEGATIVE

## 2022-05-04 LAB — TROPONIN I (HIGH SENSITIVITY)
Troponin I (High Sensitivity): 4 ng/L (ref <18)
Troponin I (High Sensitivity): 5 ng/L (ref <18)

## 2022-05-04 LAB — LIPASE, BLOOD: Lipase: 38 U/L (ref 11–51)

## 2022-05-04 NOTE — Discharge Instructions (Signed)
Please reach out to your Cardiology team on Monday by phone to make them aware of your ED visit and schedule a follow up appointment. Return to the ED with any new or suddenly worsening symptoms.   Please re-start all of your home medications.

## 2022-05-04 NOTE — ED Provider Notes (Signed)
Emergency Department Provider Note   I have reviewed the triage vital signs and the nursing notes.   HISTORY  Chief Complaint Chest Pain   HPI Susan Sexton is a 37 y.o. female with PMH of asthma, CAD with multiple DES in 2022, and HLD presents emergency department with sharp, slightly left-sided chest pain which occurred earlier this evening.  Symptoms mostly resolved at this point.  No pleuritic pain or shortness of breath.  No fevers or chills.  She is unsure if this feels like her prior heart attack in 2022 which required staged stenting. She is mostly compliant with home medications but notes being off of her Rxs for the last week. She is not out of meds or experiencing side effects. She tells me that having these issues at such a young age is stressful and sometimes she feels like she may be able to wean herself off.    Past Medical History:  Diagnosis Date   Asthma    Coronary artery disease    High cholesterol    Hyperlipidemia LDL goal <100 05/04/2020   NSTEMI (non-ST elevated myocardial infarction) ([place])     Review of Systems  Constitutional: No fever/chills Cardiovascular: Positive chest pain. Respiratory: Denies shortness of breath. Gastrointestinal: No abdominal pain.  No nausea, no vomiting.  No diarrhea.  No constipation. Genitourinary: Negative for dysuria. Musculoskeletal: Negative for back pain. Skin: Negative for rash. Neurological: Negative for headaches.  ____________________________________________   PHYSICAL EXAM:  VITAL SIGNS: ED Triage Vitals [05/04/22 0108]  Enc Vitals Group     BP 139/81     Pulse Rate 80     Resp 18     Temp 98.3 F (36.8 C)     Temp src      SpO2 99 %     Weight 154 lb (69.9 kg)     Height '5\' 8"'$  (1.727 m)   Constitutional: Alert and oriented. Well appearing and in no acute distress. Eyes: Conjunctivae are normal. Head: Atraumatic. Nose: No congestion/rhinnorhea. Mouth/Throat: Mucous membranes are moist. Neck:  No stridor.   Cardiovascular: Normal rate, regular rhythm. Good peripheral circulation. Grossly normal heart sounds.   Respiratory: Normal respiratory effort.  No retractions. Lungs CTAB. Gastrointestinal: Soft and nontender. No distention.  Musculoskeletal: No lower extremity tenderness nor edema. No gross deformities of extremities. Neurologic:  Normal speech and language. No gross focal neurologic deficits are appreciated.  Skin:  Skin is warm, dry and intact. No rash noted.  ____________________________________________   LABS (all labs ordered are listed, but only abnormal results are displayed)  Labs Reviewed  COMPREHENSIVE METABOLIC PANEL - Abnormal; Notable for the following components:      Result Value   Glucose, Bld 123 (*)    AST 64 (*)    All other components within normal limits  CBC WITH DIFFERENTIAL/PLATELET - Abnormal; Notable for the following components:   Monocytes Absolute 1.1 (*)    All other components within normal limits  RESP PANEL BY RT-PCR (RSV, FLU A&B, COVID)  RVPGX2  LIPASE, BLOOD  TROPONIN I (HIGH SENSITIVITY)  TROPONIN I (HIGH SENSITIVITY)   ____________________________________________  EKG   EKG Interpretation  Date/Time:  Saturday May 04 2022 01:12:47 EST Ventricular Rate:  72 PR Interval:  150 QRS Duration: 73 QT Interval:  394 QTC Calculation: 432 R Axis:   60 Text Interpretation: Sinus rhythm Low voltage, precordial leads Confirmed by Nanda Quinton [phone]) on 05/04/2022 1:23:08 AM        ____________________________________________  RADIOLOGY  DG Chest 2 View  Result Date: 05/04/2022 CLINICAL DATA:  Chest pain EXAM: CHEST - 2 VIEW COMPARISON:  12/17/2021 FINDINGS: The heart size and mediastinal contours are within normal limits. Both lungs are clear. The visualized skeletal structures are unremarkable. IMPRESSION: No active cardiopulmonary disease. Electronically Signed   By: Placido Sou M.D.   On: 05/04/2022 02:56     ____________________________________________   PROCEDURES  Procedure(s) performed:   Procedures  None  ____________________________________________   INITIAL IMPRESSION / ASSESSMENT AND PLAN / ED COURSE  Pertinent labs & imaging results that were available during my care of the patient were reviewed by me and considered in my medical decision making (see chart for details).   This patient is Presenting for Evaluation of CP, which does require a range of treatment options, and is a complaint that involves a high risk of morbidity and mortality.  The Differential Diagnoses includes but is not exclusive to acute coronary syndrome, aortic dissection, pulmonary embolism, cardiac tamponade, community-acquired pneumonia, pericarditis, musculoskeletal chest wall pain, etc.  I decided to review pertinent External Data, and in summary patient with left heart cath in 2022. Report reviewed and discussed with patient. Sounds like she did return for staged stenting to LAD as planned but that report is not visible for review.   Clinical Laboratory Tests Ordered, included troponin negative x 2. COVID and flu negative. No leukocytosis. Normal LFTs.   Radiologic Tests Ordered, included CXR. I independently interpreted the images and agree with radiology interpretation.   Cardiac Monitor Tracing which shows NSR.   Social Determinants of Health Risk patient is not an active smoker.   Medical Decision Making: Summary:  Patient presents to the emergency department with chest pain.  The quality pain is fairly atypical for ACS although given her history I do plan for fairly aggressive workup here.  No active pain.  No vital signs, pleuritic pain, other features to strongly suggest alternate etiology such as PE.  We do [place] discussion regarding her medication compliance.  Sounds like over the last week she is not been taking her medicines but otherwise has been compliant prior to this.  I did discuss  with her how it must be difficult at her age to have significant issues with her cholesterol and heart.  We did discuss that it is crucial that she continue her medications to prevent further heart attacks. She agrees with this assessment and plans to re-start ASAP. Has refills and meds at home.   Reevaluation with update and discussion with patient. Symptoms not worsening. Currently CP free. Plan to re-start home medications and call Cardiology team for follow up on Monday. Discussed strict ED return precautions.   Considered admission but pain is fairly atypical and ACS workup is reassuring. Patient to re-start home mediations and call for close Cardiology follow up.   Patient's presentation is most consistent with acute presentation with potential threat to life or bodily function.   Disposition: discharge  ____________________________________________  FINAL CLINICAL IMPRESSION(S) / ED DIAGNOSES  Final diagnoses:  Precordial chest pain     Note:  This document was prepared using Dragon voice recognition software and may include unintentional dictation errors.  Nanda Quinton, MD, [facility] Emergency Medicine    Teia Freitas, [facility], MD 05/04/22 [id number]

## 2022-05-04 NOTE — ED Notes (Signed)
IV removed from right forearm. Area clean/dry/intact. No bleeding.

## 2022-05-04 NOTE — ED Triage Notes (Addendum)
Pt reports sternal chest pain that began approximately 20 minutes ago. Pt reports pain has gotten better since it started. Pt has hx of cardiac stent placement. Pt reports she hasn't taken her medications in the last week.

## 2022-05-16 ENCOUNTER — Telehealth: Payer: Self-pay | Admitting: Cardiology

## 2022-05-16 NOTE — Telephone Encounter (Signed)
Pt would like a callback regarding note and or FMLA paperwork for employer. Please advise

## 2022-05-16 NOTE — Telephone Encounter (Signed)
Returned call to pt she states that she just talked to her HR dept about it and they were going to fax it, should be here sometime today.

## 2022-05-27 ENCOUNTER — Other Ambulatory Visit: Payer: Self-pay

## 2022-06-10 ENCOUNTER — Ambulatory Visit: Payer: No Typology Code available for payment source | Attending: Nurse Practitioner | Admitting: Nurse Practitioner

## 2022-06-10 ENCOUNTER — Encounter: Payer: Self-pay | Admitting: Nurse Practitioner

## 2022-06-10 ENCOUNTER — Other Ambulatory Visit (HOSPITAL_COMMUNITY)
Admission: RE | Admit: 2022-06-10 | Discharge: 2022-06-10 | Disposition: A | Discharge status: Home / Self Care | Payer: No Typology Code available for payment source | Source: Ambulatory Visit | Attending: Nurse Practitioner | Admitting: Nurse Practitioner

## 2022-06-10 VITALS — BP 111/69 | HR 78 | Ht 68.0 in | Wt 166.6 lb

## 2022-06-10 DIAGNOSIS — Z1159 Encounter for screening for other viral diseases: Secondary | ICD-10-CM

## 2022-06-10 DIAGNOSIS — Z7251 High risk heterosexual behavior: Secondary | ICD-10-CM | POA: Diagnosis present

## 2022-06-10 DIAGNOSIS — Z3189 Encounter for other procreative management: Secondary | ICD-10-CM | POA: Diagnosis not present

## 2022-06-10 DIAGNOSIS — Z114 Encounter for screening for human immunodeficiency virus [HIV]: Secondary | ICD-10-CM | POA: Diagnosis not present

## 2022-06-10 NOTE — Progress Notes (Signed)
Assessment & Plan:  Susan Sexton was seen today for urinary tract infection.  Diagnoses and all orders for this visit:  Encounter for fertility planning -     Ambulatory referral to Gynecology  Encounter for screening for HIV -     HIV Antibody (routine testing w rflx)  High risk heterosexual behavior -     Cervicovaginal ancillary only -     Cancel: POCT URINALYSIS DIP (CLINITEK)  Need for hepatitis C screening test -     HCV Ab w Reflex to Quant PCR    Patient has been counseled on age-appropriate routine health concerns for screening and prevention. These are reviewed and up-to-date. Referrals have been placed accordingly. Immunizations are up-to-date or declined.    Subjective:   Chief Complaint  Patient presents with   Urinary Tract Infection   HPI Susan Sexton 37 y.o. female presents to office today requesting STD testing as she has a new female partner and just wants to make sure everything is okay. She denies any Gu symptoms aside from vaginal odor. Also menstrual cycle just completed so she is unsure if odor may be related to this   Overdue for PAP. Requesting referral to GYN for fertility testing  Review of Systems  Constitutional:  Negative for fever, malaise/fatigue and weight loss.  HENT: Negative.  Negative for nosebleeds.   Eyes: Negative.  Negative for blurred vision, double vision and photophobia.  Respiratory: Negative.  Negative for cough and shortness of breath.   Cardiovascular: Negative.  Negative for chest pain, palpitations and leg swelling.  Gastrointestinal: Negative.  Negative for heartburn, nausea and vomiting.  Genitourinary:        Vaginal odor  Musculoskeletal: Negative.  Negative for myalgias.  Neurological: Negative.  Negative for dizziness, focal weakness, seizures and headaches.  Psychiatric/Behavioral: Negative.  Negative for suicidal ideas.     Past Medical History:  Diagnosis Date   Asthma    Coronary artery disease    High  cholesterol    Hyperlipidemia LDL goal <100 05/04/2020   NSTEMI (non-ST elevated myocardial infarction)     Past Surgical History:  Procedure Laterality Date   BREAST LUMPECTOMY Left 06/6/219   CARDIAC CATHETERIZATION     CORONARY STENT INTERVENTION N/A 05/05/2020   Procedure: CORONARY STENT INTERVENTION;  Surgeon: Wellington Hampshire, MD;  Location: [place] [facility];  Service: Cardiovascular;  Laterality: N/A;   CORONARY STENT INTERVENTION N/A 05/23/2020   Procedure: CORONARY STENT INTERVENTION;  Surgeon: Jettie Booze, MD;  Location: [place] [facility];  Service: Cardiovascular;  Laterality: N/A;   CORONARY STENT PLACEMENT  05/05/2020   CORONARY ULTRASOUND/IVUS N/A 05/23/2020   Procedure: Intravascular Ultrasound/IVUS;  Surgeon: Jettie Booze, MD;  Location: [place] [facility];  Service: Cardiovascular;  Laterality: N/A;   INCISION AND DRAINAGE BREAST ABSCESS Left 09/2017   LEFT HEART CATH AND CORONARY ANGIOGRAPHY N/A 05/05/2020   Procedure: LEFT HEART CATH AND CORONARY ANGIOGRAPHY;  Surgeon: Wellington Hampshire, MD;  Location: [facility];  Service: Cardiovascular;  Laterality: N/A;   LEFT HEART CATH AND CORONARY ANGIOGRAPHY N/A 05/23/2020   Procedure: LEFT HEART CATH AND CORONARY ANGIOGRAPHY;  Surgeon: Jettie Booze, MD;  Location: [place] [facility];  Service: Cardiovascular;  Laterality: N/A;    Family History  Problem Relation Age of Onset   Hypertension Mother    Heart attack Father 30       Had CABG, but died at age 75 after trauma  Social History Reviewed with no changes to be made today.   Outpatient Medications Prior to Visit  Medication Sig Dispense Refill   acetaminophen (TYLENOL) 500 MG tablet Take 500-1,000 mg by mouth every 6 (six) hours as needed (for pain.).     albuterol (VENTOLIN HFA) 108 (90 Base) MCG/ACT inhaler Inhale 2 puffs into the lungs every 6 (six) hours as needed for wheezing or shortness of breath. NEEDS PASS 18 g 1    aspirin 81 MG chewable tablet Chew 1 tablet (81 mg total) by mouth daily.     atorvastatin (LIPITOR) 80 MG tablet Take 1 tablet (80 mg total) by mouth daily. 90 tablet 3   Evolocumab (REPATHA SURECLICK) [id number] MG/ML SOAJ Inject 140 mg into the skin every 14 (fourteen) days. 6 mL 3   ezetimibe (ZETIA) 10 MG tablet Take 1 tablet (10 mg total) by mouth daily. 90 tablet 3   metoprolol succinate (TOPROL-XL) 25 MG 24 hr tablet Take 1/2 tablet (12.5 mg total) by mouth daily. (Patient taking differently: Take 12.5 mg by mouth daily. In The Evening) 45 tablet 3   Multiple Vitamins-Minerals (MULTI ADULT GUMMIES PO) Take 1 tablet by mouth daily.     nitroGLYCERIN (NITROSTAT) 0.4 MG SL tablet Place 1 tablet (0.4 mg total) under the tongue every 5 (five) minutes x 3 doses as needed for chest pain. 25 tablet 2   ticagrelor (BRILINTA) 90 MG TABS tablet Take 1 tablet (90 mg total) by mouth 2 (two) times daily. 180 tablet 3   fluconazole (DIFLUCAN) 150 MG tablet Take 1 tablet PO once. Repeat in 3 days if needed. 2 tablet 0   No facility-administered medications prior to visit.    No Known Allergies     Objective:    BP 111/69   Pulse 78   Ht 5\' 8"  (1.727 m)   Wt 166 lb 9.6 oz (75.6 kg)   LMP 06/07/2022 (Exact Date)   SpO2 99%   BMI 25.33 kg/m  Wt Readings from Last 3 Encounters:  06/10/22 166 lb 9.6 oz (75.6 kg)  05/04/22 154 lb (69.9 kg)  04/11/22 157 lb 3.2 oz (71.3 kg)    Physical Exam Vitals and nursing note reviewed.  Constitutional:      Appearance: She is well-developed.  HENT:     Head: Normocephalic and atraumatic.  Cardiovascular:     Rate and Rhythm: Normal rate and regular rhythm.     Heart sounds: Normal heart sounds. No murmur heard.    No friction rub. No gallop.  Pulmonary:     Effort: Pulmonary effort is normal. No tachypnea or respiratory distress.     Breath sounds: Normal breath sounds. No decreased breath sounds, wheezing, rhonchi or rales.  Chest:     Chest wall: No  tenderness.  Abdominal:     General: Bowel sounds are normal.     Palpations: Abdomen is soft.  Genitourinary:    Comments: SELF SWAB Musculoskeletal:        General: Normal range of motion.     Cervical back: Normal range of motion.  Skin:    General: Skin is warm and dry.  Neurological:     Mental Status: She is alert and oriented to person, place, and time.     Coordination: Coordination normal.  Psychiatric:        Behavior: Behavior normal. Behavior is cooperative.        Thought Content: Thought content normal.        Judgment: Judgment  normal.          Patient has been counseled extensively about nutrition and exercise as well as the importance of adherence with medications and regular follow-up. The patient was given clear instructions to go to ER or return to medical center if symptoms don't improve, worsen or new problems develop. The patient verbalized understanding.   Follow-up: Return if symptoms worsen or fail to improve.   Gildardo Pounds, FNP-BC [facility] and [place], [place]   06/10/2022, 3:32 PM

## 2022-06-10 NOTE — Progress Notes (Signed)
No symptoms  Std screening

## 2022-06-11 ENCOUNTER — Other Ambulatory Visit: Payer: Self-pay | Admitting: Nurse Practitioner

## 2022-06-11 ENCOUNTER — Other Ambulatory Visit: Payer: Self-pay

## 2022-06-11 ENCOUNTER — Telehealth: Payer: Self-pay | Admitting: Nurse Practitioner

## 2022-06-11 DIAGNOSIS — N760 Acute vaginitis: Secondary | ICD-10-CM

## 2022-06-11 DIAGNOSIS — A599 Trichomoniasis, unspecified: Secondary | ICD-10-CM

## 2022-06-11 LAB — CERVICOVAGINAL ANCILLARY ONLY
Bacterial Vaginitis (gardnerella): POSITIVE — AB
Candida Glabrata: NEGATIVE
Candida Vaginitis: NEGATIVE
Chlamydia: NEGATIVE
Comment: NEGATIVE
Comment: NEGATIVE
Comment: NEGATIVE
Comment: NEGATIVE
Comment: NEGATIVE
Comment: NORMAL
Neisseria Gonorrhea: NEGATIVE
Trichomonas: POSITIVE — AB

## 2022-06-11 MED ORDER — METRONIDAZOLE 500 MG PO TABS: 500.0000 mg | ORAL_TABLET | Freq: Two times a day (BID) | ORAL | 0 refills | Status: AC

## 2022-06-11 MED ORDER — METRONIDAZOLE 500 MG PO TABS
500.0000 mg | ORAL_TABLET | Freq: Two times a day (BID) | ORAL | 0 refills | Status: DC
  Filled 2022-06-11: qty 14, 7d supply, fill #0

## 2022-06-11 NOTE — Telephone Encounter (Signed)
Pt is calling in requesting that her prescription for metroNIDAZOLE (FLAGYL) 500 MG tablet [id number] be sent to the [facility] in [place]. Pt would like to be contacted when it is sent.  [phone]

## 2022-06-12 NOTE — Telephone Encounter (Signed)
Patient has been called and informed.

## 2022-06-22 ENCOUNTER — Encounter: Payer: Self-pay | Admitting: Nurse Practitioner

## 2022-06-24 ENCOUNTER — Telehealth: Payer: Self-pay | Admitting: Nurse Practitioner

## 2022-06-24 NOTE — Telephone Encounter (Signed)
Copied from CRM [id number]. Topic: General - Inquiry >> Jun 24, 2022  8:32 AM Carrielelia G wrote: Reason for CRM: patient back calling in regards to flagyl/metronidazole and possible UTI

## 2022-07-01 ENCOUNTER — Ambulatory Visit: Payer: Self-pay | Admitting: *Deleted

## 2022-07-01 NOTE — Telephone Encounter (Signed)
Patient is calling to follow up on the FLMA paperwork. Patient stated that their FLMA started since march. Please advise

## 2022-07-01 NOTE — Telephone Encounter (Signed)
Patient calling to follow up to see if her FMLA paperwork has been completed and signed.  Did not see on my end (unless missed it).  She states if not received can call HR director  to re-send. She was going to get the number for HR in case we need to call but lost call.  Tried to call her back, straight to VM and states VM not set up.  Have papers been received or completed? Please advise

## 2022-07-01 NOTE — Telephone Encounter (Signed)
Spoke with patient . Verified name & DOB  Appointment for 07/02/22 at 3:10.

## 2022-07-01 NOTE — Telephone Encounter (Signed)
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  Advised we do not have a general email just fax.  She never gave the HR number even though asked.  She states she would have HR  call us once she talks to them tomorrow.

## 2022-07-01 NOTE — Telephone Encounter (Signed)
Summary: possible UTI   Patient is experiencing frequent bathroom visits, she has pain and a burning sensation with urination       Chief Complaint: urinary sx  Symptoms: low abdominal pain discomfort with urinating . Burning and pain sensation while urinating full stream. Incontinence at times "can't hold it " just completed antibiotics since 06/11/22.  Frequency: since completing antibiotics for BV started on 06/11/22 Pertinent Negatives: Patient denies fever, no chills no retention . Disposition: ED /[] Urgent Care (no appt availability in office) / Appointment(In office/virtual)/  [facility]/ Home Care/ Refused Recommended Disposition /[x] [facility]/  Follow-up with PCP Additional Notes:   Patient reports she was told scheduled appt was made for 07/02/22 at 3:50 with Dr. Laural Benes. No appt noted in review of chart. Recommended going to [facility] today . Unsure patient will go .  Please advise      Reason for Disposition  Urinating more frequently than usual (i.e., frequency)  Answer Assessment - Initial Assessment Questions 1. SYMPTOM: "What's the main symptom you're concerned about?" (e.g., frequency, incontinence)     Frequency urination, pain with urination 2. ONSET: "When did the  sx  start?"     Low abdominal pain with  burning sensation with urination 3. PAIN: "Is there any pain?" If Yes, ask: "How bad is it?" (Scale: 1-10; mild, moderate, severe)     Burning and pain with urination  4. CAUSE: "What do you think is causing the symptoms?"     UTI 5. OTHER SYMPTOMS: "Do you have any other symptoms?" (e.g., blood in urine, fever, flank pain, pain with urination)     Pain with urination , low abdominal pain  6. PREGNANCY: "Is there any chance you are pregnant?" "When was your last menstrual period?"     na  Protocols used: Urinary Symptoms-A-AH

## 2022-07-02 ENCOUNTER — Ambulatory Visit: Payer: No Typology Code available for payment source | Admitting: Internal Medicine

## 2022-07-02 ENCOUNTER — Other Ambulatory Visit: Payer: Self-pay | Admitting: Nurse Practitioner

## 2022-07-02 DIAGNOSIS — N3941 Urge incontinence: Secondary | ICD-10-CM

## 2022-07-02 NOTE — Telephone Encounter (Signed)
I do not have this FMLA paperwork(I looked thru all my paperwork).

## 2022-07-03 ENCOUNTER — Ambulatory Visit: Payer: Self-pay

## 2022-07-03 NOTE — Telephone Encounter (Signed)
  Chief Complaint: UTI Symptoms: frequency, urgency and burning with urination Frequency: ongoing since 06/10/22 Pertinent Negatives: NA Disposition: ED /[] Urgent Care (no appt availability in office) / Appointment(In office/virtual)/  [facility]/ Home Care/ Refused Recommended Disposition /[x] [facility] [facility]/  Follow-up with PCP Additional Notes: pt states sx are no better after taking abx for Trichomoniasis and BV. Pt states she thought she had appt for yesterday but no appt was ever scheduled. Offered OV for tomorrow at 0830 with Dr. Alvis Lemmings, pt upset because she has been out of work d/t thinking she has appts each day and never does. Advised she could go to [facility] today and be seen for sx. Pt given location details and says she will try to go there today. Also advised that if she lets [place] know she may be able to assist with work note. No further assistance noted.   Summary: Frequent Urination and Burning sensation   Patient is experiencing frequent bathroom visits, she has pain and a burning sensation with urination Patient was told that she had an appt for today  but it is showing scheduled for Tuesday. Patient stated she is confused.       Reason for Disposition  Urinating more frequently than usual (i.e., frequency)  Answer Assessment - Initial Assessment Questions 1. SYMPTOM: "What's the main symptom you're concerned about?" (e.g., frequency, incontinence)     Frequency and burning  2. ONSET: "When did the  sx  start?"     06/10/22 3. PAIN: "Is there any pain?" If Yes, ask: "How bad is it?" (Scale: 1-10; mild, moderate, severe)     Mild to moderate  4. CAUSE: "What do you think is causing the symptoms?"     UTI 5. OTHER SYMPTOMS: "Do you have any other symptoms?" (e.g., blood in urine, fever, flank pain, pain with urination)     urgency  Protocols used: Urinary Symptoms-A-AH

## 2022-07-04 ENCOUNTER — Encounter: Payer: Self-pay | Admitting: Family Medicine

## 2022-07-04 ENCOUNTER — Ambulatory Visit: Payer: No Typology Code available for payment source | Attending: Family Medicine | Admitting: Family Medicine

## 2022-07-04 VITALS — BP 118/72 | HR 73 | Ht 68.0 in | Wt 165.4 lb

## 2022-07-04 DIAGNOSIS — N3000 Acute cystitis without hematuria: Secondary | ICD-10-CM | POA: Diagnosis not present

## 2022-07-04 LAB — POCT URINALYSIS DIP (CLINITEK)
Bilirubin, UA: NEGATIVE
Glucose, UA: NEGATIVE mg/dL
Ketones, POC UA: NEGATIVE mg/dL
Nitrite, UA: POSITIVE — AB
POC PROTEIN,UA: 30 — AB
Spec Grav, UA: 1.025 (ref 1.010–1.025)
Urobilinogen, UA: 0.2 E.U./dL
pH, UA: 6.5 (ref 5.0–8.0)

## 2022-07-04 NOTE — Progress Notes (Signed)
Burning urination. 

## 2022-07-04 NOTE — Progress Notes (Signed)
Subjective:  Patient ID: Susan Sexton, female    DOB: [birth year]  Age: 37 y.o. MRN: [id number]  CC: Urinary Frequency   HPI REMEDY CORPORAN is a 37 y.o. year old female patient of Bertram Denver, FNP with a history of CAD status post DES, nicotine dependence who presents today for an acute visit. 4 weeks ago she had a visit with her PCP.  Interval History:  She has had urinary frequency for 3 weeks ever since she was treated for Trichomonas with Flagyl. She also has had dysuria. She has a sense of incomplete voiding. She has no flank pain, nausea, vomiting or fever.  Past Medical History:  Diagnosis Date   Asthma    Coronary artery disease    High cholesterol    Hyperlipidemia LDL goal <100 05/04/2020   NSTEMI (non-ST elevated myocardial infarction)     Past Surgical History:  Procedure Laterality Date   BREAST LUMPECTOMY Left 06/6/219   CARDIAC CATHETERIZATION     CORONARY STENT INTERVENTION N/A 05/05/2020   Procedure: CORONARY STENT INTERVENTION;  Surgeon: Iran Ouch, MD;  Location: [facility] INVASIVE CV LAB;  Service: Cardiovascular;  Laterality: N/A;   CORONARY STENT INTERVENTION N/A 05/23/2020   Procedure: CORONARY STENT INTERVENTION;  Surgeon: Corky Crafts, MD;  Location: [facility] INVASIVE CV LAB;  Service: Cardiovascular;  Laterality: N/A;   CORONARY STENT PLACEMENT  05/05/2020   CORONARY ULTRASOUND/IVUS N/A 05/23/2020   Procedure: Intravascular Ultrasound/IVUS;  Surgeon: Corky Crafts, MD;  Location: [facility] INVASIVE CV LAB;  Service: Cardiovascular;  Laterality: N/A;   INCISION AND DRAINAGE BREAST ABSCESS Left 09/2017   LEFT HEART CATH AND CORONARY ANGIOGRAPHY N/A 05/05/2020   Procedure: LEFT HEART CATH AND CORONARY ANGIOGRAPHY;  Surgeon: Iran Ouch, MD;  Location: [facility] INVASIVE CV LAB;  Service: Cardiovascular;  Laterality: N/A;   LEFT HEART CATH AND CORONARY ANGIOGRAPHY N/A 05/23/2020   Procedure: LEFT HEART CATH AND CORONARY ANGIOGRAPHY;  Surgeon: Corky Crafts, MD;  Location: [facility] INVASIVE CV LAB;  Service: Cardiovascular;  Laterality: N/A;    Family History  Problem Relation Age of Onset   Hypertension Mother    Heart attack Father 75       Had CABG, but died at age 88 after trauma    Social History   Socioeconomic History   Marital status: Single    Spouse name: Not on file   Number of children: Not on file   Years of education: 15   Highest education level: 11th grade  Occupational History   Not on file  Tobacco Use   Smoking status: Former    Packs/day: .5    Types: Cigarettes    Quit date: 05/06/2020    Years since quitting: 2.1   Smokeless tobacco: Never   Tobacco comments:    Aviona was given 1-800-quit-now. Miko started back smoking 06/07/22.  Vaping Use   Vaping Use: Former   Quit date: 05/06/2020  Substance and Sexual Activity   Alcohol use: No   Drug use: No   Sexual activity: Yes    Birth control/protection: None  Other Topics Concern   Not on file  Social History Narrative   Not on file   Social Determinants of Health   Financial Resource Strain: Medium Risk (02/26/2022)   Overall Financial Resource Strain (CARDIA)    Difficulty of Paying Living Expenses: Somewhat hard  Food Insecurity: Food Insecurity Present (02/26/2022)   Hunger Vital Sign    Worried About Running Out of  Food in the Last Year: Sometimes true    Ran Out of Food in the Last Year: Sometimes true  Transportation Needs: No Transportation Needs (02/26/2022)   PRAPARE - Administrator, Civil Service (Medical): No    Lack of Transportation (Non-Medical): No  Physical Activity: Not on file  Stress: Stress Concern Present (10/19/2021)   [employer] of Occupational Health - Occupational Stress Questionnaire    Feeling of Stress : Rather much  Social Connections: Not on file    No Known Allergies  Outpatient Medications Prior to Visit  Medication Sig Dispense Refill   acetaminophen (TYLENOL) 500 MG tablet Take  500-1,000 mg by mouth every 6 (six) hours as needed (for pain.).     albuterol (VENTOLIN HFA) 108 (90 Base) MCG/ACT inhaler Inhale 2 puffs into the lungs every 6 (six) hours as needed for wheezing or shortness of breath. NEEDS PASS 18 g 1   aspirin 81 MG chewable tablet Chew 1 tablet (81 mg total) by mouth daily.     atorvastatin (LIPITOR) 80 MG tablet Take 1 tablet (80 mg total) by mouth daily. 90 tablet 3   Evolocumab (REPATHA SURECLICK) 140 MG/ML SOAJ Inject 140 mg into the skin every 14 (fourteen) days. 6 mL 3   ezetimibe (ZETIA) 10 MG tablet Take 1 tablet (10 mg total) by mouth daily. 90 tablet 3   metoprolol succinate (TOPROL-XL) 25 MG 24 hr tablet Take 1/2 tablet (12.5 mg total) by mouth daily. (Patient taking differently: Take 12.5 mg by mouth daily. In The Evening) 45 tablet 3   Multiple Vitamins-Minerals (MULTI ADULT GUMMIES PO) Take 1 tablet by mouth daily.     nitroGLYCERIN (NITROSTAT) 0.4 MG SL tablet Place 1 tablet (0.4 mg total) under the tongue every 5 (five) minutes x 3 doses as needed for chest pain. 25 tablet 2   ticagrelor (BRILINTA) 90 MG TABS tablet Take 1 tablet (90 mg total) by mouth 2 (two) times daily. 180 tablet 3   No facility-administered medications prior to visit.     ROS Review of Systems  Constitutional:  Negative for activity change and appetite change.  HENT:  Negative for sinus pressure and sore throat.   Respiratory:  Negative for chest tightness, shortness of breath and wheezing.   Cardiovascular:  Negative for chest pain and palpitations.  Gastrointestinal:  Negative for abdominal distention, abdominal pain and constipation.  Genitourinary:  Positive for frequency and pelvic pain. Negative for flank pain.  Musculoskeletal: Negative.   Psychiatric/Behavioral:  Negative for behavioral problems and dysphoric mood.     Objective:  BP 118/72   Pulse 73   Ht 5\' 8"  (1.727 m)   Wt 165 lb 6.4 oz (75 kg)   LMP 06/07/2022 (Exact Date)   SpO2 100%   BMI  25.15 kg/m      07/04/2022    2:33 PM 06/10/2022    3:16 PM 05/04/2022    4:05 AM  BP/Weight  Systolic BP 118 111 113  Diastolic BP 72 69 84  Wt. (Lbs) 165.4 166.6   BMI 25.15 kg/m2 25.33 kg/m2       Physical Exam Constitutional:      Appearance: She is well-developed.  Cardiovascular:     Rate and Rhythm: Normal rate.     Heart sounds: Normal heart sounds. No murmur heard. Pulmonary:     Effort: Pulmonary effort is normal.     Breath sounds: Normal breath sounds. No wheezing or rales.  Chest:  Chest wall: No tenderness.  Abdominal:     General: Bowel sounds are normal. There is no distension.     Palpations: Abdomen is soft. There is no mass.     Tenderness: There is no abdominal tenderness.  Musculoskeletal:        General: Normal range of motion.     Right lower leg: No edema.     Left lower leg: No edema.  Neurological:     Mental Status: She is alert and oriented to person, place, and time.  Psychiatric:        Mood and Affect: Mood normal.        Latest Ref Rng & Units 05/04/2022    1:33 AM 07/10/2021    9:00 AM 03/15/2021    2:44 PM  CMP  Glucose 70 - 99 mg/dL 562  130  84   BUN 6 - 20 mg/dL Creatinine 0.44 - 1.00 mg/dL 8.65  7.84  6.96   Sodium 135 - 145 mmol/L 135  137  143   Potassium 3.5 - 5.1 mmol/L 3.7  3.7  3.8   Chloride 98 - 111 mmol/L 104  105  106   CO2 22 - 32 mmol/L Calcium 8.9 - 10.3 mg/dL 9.1  9.4  29.5   Total Protein 6.5 - 8.1 g/dL 7.7   7.7   Total Bilirubin 0.3 - 1.2 mg/dL 0.6   0.3   Alkaline Phos 38 - 126 U/L 85   113   AST 15 - 41 U/L 64   17   ALT 0 - 44 U/L 25   13     Lipid Panel     Component Value Date/Time   CHOL 212 (H) 06/14/2021 1109   TRIG 39 06/14/2021 1109   HDL 48 06/14/2021 1109   CHOLHDL 4.4 06/14/2021 1109   CHOLHDL 4.7 05/05/2020 0012   VLDL 10 05/05/2020 0012   LDLCALC 157 (H) 06/14/2021 1109    CBC    Component Value Date/Time   WBC 10.3 05/04/2022 0133   RBC 3.95  05/04/2022 0133   HGB 12.3 05/04/2022 0133   HGB 13.0 03/15/2021 1444   HCT 36.3 05/04/2022 0133   HCT 39.0 03/15/2021 1444   PLT 211 05/04/2022 0133   PLT 241 03/15/2021 1444   MCV 91.9 05/04/2022 0133   MCV 93 03/15/2021 1444   MCH 31.1 05/04/2022 0133   MCHC 33.9 05/04/2022 0133   RDW 12.9 05/04/2022 0133   RDW 12.2 03/15/2021 1444   LYMPHSABS 3.8 05/04/2022 0133   LYMPHSABS 3.3 (H) 03/15/2021 1444   MONOABS 1.1 (H) 05/04/2022 0133   EOSABS 0.2 05/04/2022 0133   EOSABS 0.1 03/15/2021 1444   BASOSABS 0.0 05/04/2022 0133   BASOSABS 0.0 03/15/2021 1444    No results found for: "HGBA1C"  Urinalysis    Component Value Date/Time   COLORURINE YELLOW 10/19/2018 2229   APPEARANCEUR Clear 07/10/2020 1250   LABSPEC >1.030 (H) 10/19/2018 2229   PHURINE 6.0 10/19/2018 2229   GLUCOSEU Negative 07/10/2020 1250   HGBUR NEGATIVE 10/19/2018 2229   BILIRUBINUR negative 07/04/2022 1444   BILIRUBINUR Negative 07/10/2020 1250   KETONESUR negative 07/04/2022 1444   KETONESUR NEGATIVE 10/19/2018 2229   PROTEINUR Negative 07/10/2020 1250   PROTEINUR NEGATIVE 10/19/2018 2229   UROBILINOGEN 0.2 07/04/2022 1444   UROBILINOGEN 0.2 05/31/2014 0036   NITRITE Positive (A) 07/04/2022 1444   NITRITE Negative 07/10/2020 1250  NITRITE NEGATIVE 10/19/2018 2229   LEUKOCYTESUR Moderate (2+) (A) 07/04/2022 1444   LEUKOCYTESUR Negative 07/10/2020 1250   LEUKOCYTESUR NEGATIVE 10/19/2018 2229     Assessment & Plan:  1. Acute cystitis without hematuria Positive for UTI Will send off urine culture and treat according to culture and sensitivity Advised to drink water and cranberry juice in the meantime until urine culture results are obtained. - POCT URINALYSIS DIP (CLINITEK) - Urine Culture    No orders of the defined types were placed in this encounter.   Follow-up: Return in about 3 months (around 10/03/2022) for Chronic medical conditions.       Hoy Register, MD, FAAFP. [facility] [place], [place] [phone]   07/04/2022, 3:09 PM

## 2022-07-04 NOTE — Patient Instructions (Signed)
Urinary Tract Infection, Adult  A urinary tract infection (UTI) is an infection of any part of the urinary tract. The urinary tract includes the kidneys, ureters, bladder, and urethra. These organs make, store, and get rid of urine in the body. An upper UTI affects the ureters and kidneys. A lower UTI affects the bladder and urethra. What are the causes? Most urinary tract infections are caused by bacteria in your genital area around your urethra, where urine leaves your body. These bacteria grow and cause inflammation of your urinary tract. What increases the risk? You are more likely to develop this condition if: You have a urinary catheter that stays in place. You are not able to control when you urinate or have a bowel movement (incontinence). You are female and you: Use a spermicide or diaphragm for birth control. Have low estrogen levels. Are pregnant. You have certain genes that increase your risk. You are sexually active. You take antibiotic medicines. You have a condition that causes your flow of urine to slow down, such as: An enlarged prostate, if you are female. Blockage in your urethra. A kidney stone. A nerve condition that affects your bladder control (neurogenic bladder). Not getting enough to drink, or not urinating often. You have certain medical conditions, such as: Diabetes. A weak disease-fighting system (immunesystem). Sickle cell disease. Gout. Spinal cord injury. What are the signs or symptoms? Symptoms of this condition include: Needing to urinate right away (urgency). Frequent urination. This may include small amounts of urine each time you urinate. Pain or burning with urination. Blood in the urine. Urine that smells bad or unusual. Trouble urinating. Cloudy urine. Vaginal discharge, if you are female. Pain in the abdomen or the lower back. You may also have: Vomiting or a decreased appetite. Confusion. Irritability or tiredness. A fever or  chills. Diarrhea. The first symptom in older adults may be confusion. In some cases, they may not have any symptoms until the infection has worsened. How is this diagnosed? This condition is diagnosed based on your medical history and a physical exam. You may also have other tests, including: Urine tests. Blood tests. Tests for STIs (sexually transmitted infections). If you have had more than one UTI, a cystoscopy or imaging studies may be done to determine the cause of the infections. How is this treated? Treatment for this condition includes: Antibiotic medicine. Over-the-counter medicines to treat discomfort. Drinking enough water to stay hydrated. If you have frequent infections or have other conditions such as a kidney stone, you may need to see a health care provider who specializes in the urinary tract (urologist). In rare cases, urinary tract infections can cause sepsis. Sepsis is a life-threatening condition that occurs when the body responds to an infection. Sepsis is treated in the hospital with IV antibiotics, fluids, and other medicines. Follow these instructions at home:  Medicines Take over-the-counter and prescription medicines only as told by your health care provider. If you were prescribed an antibiotic medicine, take it as told by your health care provider. Do not stop using the antibiotic even if you start to feel better. General instructions Make sure you: Empty your bladder often and completely. Do not hold urine for long periods of time. Empty your bladder after sex. Wipe from front to back after urinating or having a bowel movement if you are female. Use each tissue only one time when you wipe. Drink enough fluid to keep your urine pale yellow. Keep all follow-up visits. This is important. Contact a health   care provider if: Your symptoms do not get better after 1-2 days. Your symptoms go away and then return. Get help right away if: You have severe pain in  your back or your lower abdomen. You have a fever or chills. You have nausea or vomiting. Summary A urinary tract infection (UTI) is an infection of any part of the urinary tract, which includes the kidneys, ureters, bladder, and urethra. Most urinary tract infections are caused by bacteria in your genital area. Treatment for this condition often includes antibiotic medicines. If you were prescribed an antibiotic medicine, take it as told by your health care provider. Do not stop using the antibiotic even if you start to feel better. Keep all follow-up visits. This is important. This information is not intended to replace advice given to you by your health care provider. Make sure you discuss any questions you have with your health care provider. Document Revised: 10/08/2019 Document Reviewed: 10/08/2019 Elsevier Patient Education  2023 [employer]vier Inc.  

## 2022-07-06 ENCOUNTER — Other Ambulatory Visit: Payer: Self-pay | Admitting: Family Medicine

## 2022-07-06 ENCOUNTER — Other Ambulatory Visit: Payer: Self-pay

## 2022-07-06 LAB — URINE CULTURE

## 2022-07-06 MED ORDER — NITROFURANTOIN MONOHYD MACRO 100 MG PO CAPS
100.0000 mg | ORAL_CAPSULE | Freq: Two times a day (BID) | ORAL | 0 refills | Status: DC
  Filled 2022-07-06: qty 10, 5d supply, fill #0

## 2022-07-08 ENCOUNTER — Other Ambulatory Visit: Payer: Self-pay

## 2022-07-08 ENCOUNTER — Telehealth: Payer: Self-pay | Admitting: Family Medicine

## 2022-07-08 DIAGNOSIS — N3000 Acute cystitis without hematuria: Secondary | ICD-10-CM

## 2022-07-08 MED ORDER — NITROFURANTOIN MONOHYD MACRO 100 MG PO CAPS: 100.0000 mg | ORAL_CAPSULE | Freq: Two times a day (BID) | ORAL | 0 refills | Status: DC

## 2022-07-08 NOTE — Telephone Encounter (Signed)
nitrofurantoin, macrocrystal-monohydrate, (MACROBID) 100 MG capsule   sent to:    [facility] [address], [place] - [address]    Phone:                                                         [phone] Fax:                                                             [phone]

## 2022-07-08 NOTE — Telephone Encounter (Signed)
Pt medication was sent to the wrong pharmacy on 07/06/22. Medication was sent to  [facility] [address], [address], [place] [address] Phone: [phone]  Fax: [phone]     nitrofurantoin, macrocrystal-monohydrate, (MACROBID) 100 MG capsule   Correct pharmacy where medication need to be sent to:   [facility] [address] [place] - [address]  Phone: [phone] Fax: [phone]

## 2022-07-09 NOTE — Progress Notes (Signed)
Cardiology Office Note:    Date:  07/17/2022   ID:  Susan Sexton, DOB [birth year], MRN [id number]  PCP:  Hoy Register, MD  Cardiologist:  Little Ishikawa, MD  Electrophysiologist:  None   Referring MD: Hoy Register, MD   Chief Complaint: follow-up of chest pain  History of Present Illness:    Susan Sexton is a 37 y.o. female with a history of CAD with NSTEMI in 04/2020 s/p DES to proximal RCA and tthen staged PCI with DES to mid left main and DES x2 to mid LAD in 05/2020, hypertension, hyperlipidemia, asthma, and anxiety who is followed by Dr. Bjorn Pippin and presents today for follow-up of chest pain.  Patient was admitted in 04/2020 for NSTEMI. Echo showed LVEF of 60-65% with no regional wall motion abnormalities and LVH. LHC showed 99% stenosis of proximal RCA followed by 50% stenosis of proximal to mid RCA and 20% stenosis of distal RCA, 80% stenosis of proximal to mid LAD,  and 40% stenosis of proximal to mid LCx.  She underwent successful PCI with DES to RCA at that time and then staged PCI with  DES x2 to mid LAD and 05/2020. At time of staged PCI, cath also showed 60% stenosis of mid left main felt to likely be guide catheter dissection which was treated with DES. She has had a couple episodes of recurrent chest pain since them in the setting of stress/anxiety.  She was last seen by Edd Fabian, NP, in 04/2022 which time she was doing well from a cardiac standpoint.  As last visit, patient was seen in [facility] ED on 07/08/2022 for further evaluation of fatigue and dyspnea.  She also complained of a little chest pain the day before.  EKG reportedly showed normal sinus rhythm with nonspecific ST changes.  High-sensitivity troponin was negative x2.  Chest x-ray showed no acute findings.  Labs are significant for mild leukocytosis with WBC of 12 but otherwise were unremarkable.  She was advised she may be coming down with a mild illness but was still to be stable for discharge with  instructions to follow-up with Cardiology.  Patient presents today for follow-up. Patient has a history of intermittent chest pain normally associated with anxiety and stress. However, she presented to the ED last week for worsening chest pain with associated vomiting and some increased work of breathing. She was told she may have a viral illnesses. She states she usually has intermittent chest tightness a couple of times a week (again this is usually with anxiety/ stress). However, she also reported some chest pain when lifting something heavy at work (she works in a warehouse). She repeated multiple times that her jobs is very stressful. She states chest pain is different than the symptoms she had prior to her PCIs in 2022. However, she also reports that she has had "no energy" over the last 2 weeks which does remind her of how she felt prior to her PCI. She notes some shortness of breath due to her asthma in the heat as well as some shortness of breath due to her allergies but this does not sound new. No other shortness of breath outside of these times. No orthopnea, PND, or edema. She describes intermittent episodes of fluttering since she had her heart attack. These episodes last about 10 minutes at time and occur multiple times a week. She states these are not always related to when she is stressed or anxious. She also reports some dizziness which are  sometimes related to her palpitations. No syncope. Of note, she was recently diagnosed with Trichomonas and then a UTI and completed 2 courses of antibiotics which is likely contributing to her feeling poorly recently.   Past Medical History:  Diagnosis Date   Asthma    Coronary artery disease    High cholesterol    Hyperlipidemia LDL goal <100 05/04/2020   NSTEMI (non-ST elevated myocardial infarction) [facility])     Past Surgical History:  Procedure Laterality Date   BREAST LUMPECTOMY Left 06/6/219   CARDIAC CATHETERIZATION     CORONARY STENT  INTERVENTION N/A 05/05/2020   Procedure: CORONARY STENT INTERVENTION;  Surgeon: Iran Ouch, MD;  Location: [facility] INVASIVE CV LAB;  Service: Cardiovascular;  Laterality: N/A;   CORONARY STENT INTERVENTION N/A 05/23/2020   Procedure: CORONARY STENT INTERVENTION;  Surgeon: Corky Crafts, MD;  Location: [facility] INVASIVE CV LAB;  Service: Cardiovascular;  Laterality: N/A;   CORONARY STENT PLACEMENT  05/05/2020   CORONARY ULTRASOUND/IVUS N/A 05/23/2020   Procedure: Intravascular Ultrasound/IVUS;  Surgeon: Corky Crafts, MD;  Location: [facility] INVASIVE CV LAB;  Service: Cardiovascular;  Laterality: N/A;   INCISION AND DRAINAGE BREAST ABSCESS Left 09/2017   LEFT HEART CATH AND CORONARY ANGIOGRAPHY N/A 05/05/2020   Procedure: LEFT HEART CATH AND CORONARY ANGIOGRAPHY;  Surgeon: Iran Ouch, MD;  Location: [facility] INVASIVE CV LAB;  Service: Cardiovascular;  Laterality: N/A;   LEFT HEART CATH AND CORONARY ANGIOGRAPHY N/A 05/23/2020   Procedure: LEFT HEART CATH AND CORONARY ANGIOGRAPHY;  Surgeon: Corky Crafts, MD;  Location: [facility] INVASIVE CV LAB;  Service: Cardiovascular;  Laterality: N/A;    Current Medications: Current Meds  Medication Sig   acetaminophen (TYLENOL) 500 MG tablet Take 500-1,000 mg by mouth every 6 (six) hours as needed (for pain.).   albuterol (VENTOLIN HFA) 108 (90 Base) MCG/ACT inhaler Inhale 2 puffs into the lungs every 6 (six) hours as needed for wheezing or shortness of breath. NEEDS PASS   aspirin 81 MG chewable tablet Chew 1 tablet (81 mg total) by mouth daily.   atorvastatin (LIPITOR) 80 MG tablet Take 1 tablet (80 mg total) by mouth daily.   Evolocumab (REPATHA SURECLICK) 140 MG/ML SOAJ Inject 140 mg into the skin every 14 (fourteen) days.   ezetimibe (ZETIA) 10 MG tablet Take 1 tablet (10 mg total) by mouth daily.   metoprolol succinate (TOPROL-XL) 25 MG 24 hr tablet Take 1/2 tablet (12.5 mg total) by mouth daily. (Patient taking differently: Take 12.5 mg by mouth  daily. In The Evening)   Multiple Vitamins-Minerals (MULTI ADULT GUMMIES PO) Take 1 tablet by mouth daily.   nitrofurantoin, macrocrystal-monohydrate, (MACROBID) 100 MG capsule Take 1 capsule (100 mg total) by mouth 2 (two) times daily.   nitroGLYCERIN (NITROSTAT) 0.4 MG SL tablet Place 1 tablet (0.4 mg total) under the tongue every 5 (five) minutes x 3 doses as needed for chest pain.   ticagrelor (BRILINTA) 90 MG TABS tablet Take 1 tablet (90 mg total) by mouth 2 (two) times daily.     Allergies:   Patient has no known allergies.   Social History   Socioeconomic History   Marital status: Single    Spouse name: Not on file   Number of children: Not on file   Years of education: 11   Highest education level: 11th grade  Occupational History   Not on file  Tobacco Use   Smoking status: Former    Packs/day: .5    Types: Cigarettes  Quit date: 05/06/2020    Years since quitting: 2.1   Smokeless tobacco: Never   Tobacco comments:    [place] was given 1-800-quit-now. [place] started back smoking 06/07/22.  Vaping Use   Vaping Use: Former   Quit date: 05/06/2020  Substance and Sexual Activity   Alcohol use: No   Drug use: No   Sexual activity: Yes    Birth control/protection: None  Other Topics Concern   Not on file  Social History Narrative   Not on file   Social Determinants of Health   Financial Resource Strain: Medium Risk (02/26/2022)   Overall Financial Resource Strain (CARDIA)    Difficulty of Paying Living Expenses: Somewhat hard  Food Insecurity: Food Insecurity Present (02/26/2022)   Hunger Vital Sign    Worried About Running Out of Food in the Last Year: Sometimes true    Ran Out of Food in the Last Year: Sometimes true  Transportation Needs: No Transportation Needs (02/26/2022)   PRAPARE - Administrator, Civil Service (Medical): No    Lack of Transportation (Non-Medical): No  Physical Activity: Not on file  Stress: Stress Concern Present  (10/19/2021)   [employer] of Occupational Health - Occupational Stress Questionnaire    Feeling of Stress : Rather much  Social Connections: Not on file     Family History: The patient's family history includes Heart attack (age of onset: 7) in her father; Hypertension in her mother.  ROS:   Please see the history of present illness.     EKGs/Labs/Other Studies Reviewed:    The following studies were reviewe:  Echocardiogram 05/05/2020: Impressions: 1. Left ventricular ejection fraction, by estimation, is 60 to 65%. The  left ventricle has normal function. The left ventricle has no regional  wall motion abnormalities. There is mild left ventricular hypertrophy.  Left ventricular diastolic parameters  were normal.   2. Right ventricular systolic function is normal. The right ventricular  size is normal. There is normal pulmonary artery systolic pressure. The  estimated right ventricular systolic pressure is 35.8 mmHg.   3. Right atrial size was mildly dilated.   4. The mitral valve is normal in structure. No evidence of mitral valve  regurgitation. No evidence of mitral stenosis.   5. The aortic valve is normal in structure. Aortic valve regurgitation is  not visualized. No aortic stenosis is present.   6. The inferior vena cava is dilated in size with <50% respiratory  variability, suggesting right atrial pressure of 15 mmHg.  _______________  Left Cardiac Catheterization 05/05/2020: Prox RCA lesion is 99% stenosed. Prox RCA to Mid RCA lesion is 50% stenosed. Dist RCA lesion is 20% stenosed. Prox Cx to Mid Cx lesion is 40% stenosed. Prox LAD to Mid LAD lesion is 80% stenosed. A drug-eluting stent was successfully placed using a STENT RESOLUTE ONYX [id number]. Post intervention, there is a 0% residual stenosis. Post intervention, there is a 0% residual stenosis.   1.  Significant two-vessel coronary artery disease.  The culprit is 99% stenosis in the proximal RCA.  In  addition, the mid RCA has moderate diffuse disease.  The LAD has 80% proximal/mid LAD at the bifurcation of a large diagonal branch. 2.  Left ventricular angiography was not performed.  EF was normal by echo.  Mildly elevated left ventricular end-diastolic pressure.\ 3.  Successful angioplasty and drug-eluting stent placement to the RCA.  I elected to use a long stent to cover the moderate disease in the mid vessel in  addition to the critical stenosis in the proximal segment.   Recommendations: Dual antiplatelet therapy for at least 12 months. Aggressive treatment of risk factors especially her hyperlipidemia.  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 Recommend staged LAD PCI in few weeks.  This was not done today given difficulty sedating the patient. Can likely discharge home tomorrow if no issues.  Diagnostic Dominance: Right  Intervention     _______________  Coronary Stent Intervention 05/23/2020: Prox Cx to Mid Cx lesion is 40% stenosed. Dist RCA lesion is 20% stenosed. Previously placed Prox RCA drug eluting stent is widely patent. Previously placed Prox RCA to Mid RCA drug eluting stent is widely patent. Prox LAD to Mid LAD lesion is 80% stenosed. A drug-eluting stent was successfully placed using a STENT RESOLUTE ONYX 3.5X30, postdilated to 3.75 mm and optimized with intravascular ultrasound. Post intervention, there is a 0% residual stenosis. Mid LAD lesion is 75% stenosed in a very tortuous vessel, at the distal edge of the prior stent, likely representing edge dissection. A drug-eluting stent was successfully placed using a STENT RESOLUTE ONYX 3.5X8., Postdilated to 3.75 mm and optimized with intravascular ultrasound. Post intervention, there is a 0% residual stenosis. Mid LM lesion is 60% stenosed, likely guide catheter dissection. A drug-eluting stent was successfully placed using a STENT RESOLUTE ONYX 3.5X12,  postdilated to greater than 4 mm in diameter and optimized with intravascular ultrasound. Post intervention, there is a 0% residual stenosis. LV end diastolic pressure is normal. There is no aortic valve stenosis.   Continue dual antiplatelet therapy for 12 months.  Given the length of stented segments in both her LAD, left main and RCA, would continue clopidogrel monotherapy after the 1 year of dual antiplatelet therapy.  She needs aggressive secondary prevention.  We talked about smoking cessation as well.  Diagnostic Dominance: Right  Intervention       EKG:  EKG ordered today. EKG personally reviewed and demonstrates normal sinus rhythm, rate 75 bpm, with no acute ST/ T changes. Normal axis. Normal PR and QRS intervals. QTc 442 ms.  Recent Labs: 05/04/2022: ALT 25; BUN 18; Creatinine, Ser 0.72; Hemoglobin 12.3; Platelets 211; Potassium 3.7; Sodium 135  Recent Lipid Panel    Component Value Date/Time   CHOL 212 (H) 06/14/2021 1109   TRIG 39 06/14/2021 1109   HDL 48 06/14/2021 1109   CHOLHDL 4.4 06/14/2021 1109   CHOLHDL 4.7 05/05/2020 0012   VLDL 10 05/05/2020 0012   LDLCALC 157 (H) 06/14/2021 1109    Physical Exam:    Vital Signs: BP 118/82   Pulse 75   Ht 5\' 8"  (1.727 m)   Wt 165 lb (74.8 kg)   SpO2 98%   BMI 25.09 kg/m     Wt Readings from Last 3 Encounters:  07/17/22 165 lb (74.8 kg)  07/04/22 165 lb 6.4 oz (75 kg)  06/10/22 166 lb 9.6 oz (75.6 kg)     General: 37 y.o. African-American female in no acute distress. HEENT: Normocephalic and atraumatic. Sclera clear. EOMs intact. Neck: Supple. Right carotid bruits. No JVD. Heart: RRR. Distinct S1 and S2. No murmurs, gallops, or rubs.  Lungs: No increased work of breathing. Clear to ausculation bilaterally. No wheezes, rhonchi, or rales.  Abdomen: Soft, non-distended, and non-tender to palpation. MSK: Normal strength and tone for age.  Extremities: No lower extremity edema.    Skin: Warm and dry. Neuro:  Alert and oriented x3. No focal deficits.  Psych: Normal affect. Responds appropriately.  Assessment:    1. Chest pain, unspecified type   2. Coronary artery disease involving native coronary artery of native heart without angina pectoris   3. Palpitations   4. Bruit of right carotid artery   5. Primary hypertension   6. Hyperlipidemia, unspecified hyperlipidemia type     Plan:    Chest Pain CAD Patient has a history of NSTEMI in 04/2020 s/p DES to proximal RCA and then  DES x2 to mid LAD and DES to mid left main (due to suspected guide cathter dissection) in 05/2020.  She has had some intermittent episodes of chest pain since that time normal in the setting of stress/anxiety. She was recently see in the [facility] ED for chest pain. High-sensitivity troponin negative x2.   - Patient continues to report intermittent chest pain normally related to stress/ anxiety. However, she also reports some chest pain when lifting something heavy at work. This feels different than the symptoms she had prior to PCI in 2022. However, she also reports decreased energy which she did have prior to her MI. - EKG today shows normal sinus rhythm with no acute ST/T changes.   - Continue Toprol-XL 12.5mg  daily.  - She has been on DAPT with Asprin and Brilinta. Given she is now >2 years out from PCI so will discuss with Dr. Bjorn Pippin on whether Brilinta can be decreased to 60mg  twice daily or stopped completely.  - Continue Lipitor, Zetia, and Repatha. - It sounds like chest pain is probably related to stress and anxiety. However, will repeat Lexiscan Myoview for further evaluation. Would ideally get cardiac PET but given longer wait time with this decided to proceed with Lexiscan instead.   Shared Decision Making/Informed Consent{ The risks [chest pain, shortness of breath, cardiac arrhythmias, dizziness, blood pressure fluctuations, myocardial infarction, stroke/transient ischemic attack, nausea, vomiting, allergic  reaction, radiation exposure, metallic taste sensation and life-threatening complications (estimated to be 1 in 10,000)], benefits (risk stratification, diagnosing coronary artery disease, treatment guidance) and alternatives of a nuclear stress test were discussed in detail with Ms. Onalee Hua and she agrees to proceed.   Of note, patient requested that I fill out FMLA paperwork today because she has reportedly been missing a lot of work. However, I do not feel comfortable doing this as I do not think she needs FMLA paperwork from a cardiac perspective. She became tearful with this and stated she did not understand how her symptoms cannot be heart related. She told me (more than once) that the chest pain normally occurs when she is stressed and anxious and also told me multiple times that her job is very stressful right now. I think her symptoms may be due to stress/ anxiety rather than progressive CAD.  However, I advised her that we would evaluate that further with the stress test. If stress test is abnormal and she needs repeat PCI, we may be able to write her out of work for 1-2 weeks but will need to start with the stress test. At this time, I do not feel comfortably completing her FMLA paperwork. Advised her that she can see if her PCP will fill this out. I also asked patient if she would like to talk with someone about the stress/ anxiety she has had since her MI but she declined and just said "I know what makes me stressed - it is my job." She did ask for a work note for today which I happily provided but then requested  that it include that she cannot go back to work until 07/23/2022. 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   Palpitations Patient reported intermittent episodes of fluttering that last for 10 minutes and occur multiple times a week. Not always associated with stress/ anxiety.  - Will order  2 week Zio monitor.   Carotid Bruit Right carotid bruit noted on exam.  - Will order carotid dopplers.   Hypertension BP well controlled.  - Continue Toprol-XL 12.5mg  daily.  Hyperlipidemia Last lipid panel in 06/2021: Total Cholesterol 212, Triglycerides 39, HDL 48, LDL 157. LDL goal <70 given CAD. - Currently on Lipitor 80mg  daily, Zetia 10mg  daily, and Repatha. - She is due for repeat labs (lipid panel and LFTs). Will try to get these done when she comes in for Myoview.   Disposition: Follow up in 3-4 months with Dr. Bjorn Pippin (patient specifically requested to see Dr. Bjorn Pippin).    Medication Adjustments/Labs and Tests Ordered: Current medicines are reviewed at length with the patient today.  Concerns regarding medicines are outlined above.  Orders Placed This Encounter  Procedures   Cardiac Stress Test: Informed Consent Details: Physician/Practitioner Attestation; Transcribe to consent form and obtain patient signature   MYOCARDIAL PERFUSION IMAGING   LONG TERM MONITOR (3-14 DAYS)   EKG 12-Lead   VAS US CAROTID   No orders of the defined types were placed in this encounter.   Patient Instructions  Medication Instructions:  Your physician recommends that you continue on your current medications as directed. Please refer to the Current Medication list given to you today.  *If you need a refill on your cardiac medications before your next appointment, please call your pharmacy*   Lab Work: Your physician recommends that you have the following labs drawn the same day as your Lexiscan: Lipid and LFT If you have labs (blood work) drawn today and your tests are completely normal, you will receive your results only by: MyChart Message (if you have MyChart) OR A paper copy in the mail If you have any lab test that is abnormal or we need to change your treatment, we will call you to review the results.   Testing/Procedures: Your physician has requested that you have a lexiscan  myoview. For further information please visit [email or web address] Please follow instruction sheet, as given.   Your physician has requested that you have a carotid duplex. This test is an ultrasound of the carotid arteries in your neck. It looks at blood flow through these arteries that supply the brain with blood. Allow one hour for this exam. There are no restrictions or special instructions.   ZIO XT- Long Term Monitor Instructions  Your physician has requested you wear a ZIO patch monitor for 14 days.  This is a single patch monitor. [employer] supplies one patch monitor per enrollment. Additional stickers are not available. Please do not apply patch if you will be having a Nuclear Stress Test,  Echocardiogram, Cardiac CT, MRI, or Chest Xray during the period you would be wearing the  monitor. The patch cannot be worn during these tests. You cannot remove and re-apply the  ZIO XT patch monitor.  Your ZIO patch monitor will be mailed 3 day USPS to your address on file. It may take 3-5 days  to receive your monitor after you have been enrolled.  Once you have received your monitor, please review  the enclosed instructions. Your monitor  has already been registered assigning a specific monitor serial # to you.  Billing and Patient Assistance Program Information  We have supplied [employer] with any of your insurance information on file for billing purposes. [employer] offers a sliding scale Patient Assistance Program for patients that do not have  insurance, or whose insurance does not completely cover the cost of the ZIO monitor.  You must apply for the Patient Assistance Program to qualify for this discounted rate.  To apply, please call [employer] at [phone], select option 4, select option 2, ask to apply for  Patient Assistance Program. Meredeth Ide will ask your household income, and how many people  are in your household. They will quote your out-of-pocket cost based on that information.   [employer] will also be able to set up a 37-month, interest-free payment plan if needed.  Applying the monitor   Shave hair from upper left chest.  Hold abrader disc by orange tab. Rub abrader in 40 strokes over the upper left chest as  indicated in your monitor instructions.  Clean area with 4 enclosed alcohol pads. Let dry.  Apply patch as indicated in monitor instructions. Patch will be placed under collarbone on left  side of chest with arrow pointing upward.  Rub patch adhesive wings for 2 minutes. Remove white label marked "1". Remove the white  label marked "2". Rub patch adhesive wings for 2 additional minutes.  While looking in a mirror, press and release button in center of patch. A small green light will  flash 3-4 times. This will be your only indicator that the monitor has been turned on.  Do not shower for the first 24 hours. You may shower after the first 24 hours.  Press the button if you feel a symptom. You will hear a small click. Record Date, Time and  Symptom in the Patient Logbook.  When you are ready to remove the patch, follow instructions on the last 2 pages of Patient  Logbook. Stick patch monitor onto the last page of Patient Logbook.  Place Patient Logbook in the blue and white box. Use locking tab on box and tape box closed  securely. The blue and white box has prepaid postage on it. Please place it in the mailbox as  soon as possible. Your physician should have your test results approximately 7 days after the  monitor has been mailed back to [facility].  Call [facility] Customer Care at [phone] if you have questions regarding  your ZIO XT patch monitor. Call them immediately if you see an orange light blinking on your  monitor.  If your monitor falls off in less than 4 days, contact our Monitor department at [phone].  If your monitor becomes loose or falls off after 4 days call [employer] at [phone] for  suggestions on securing your  monitor   Follow-Up: At [facility], you and your health needs are our priority.  As part of our continuing mission to provide you with exceptional heart care, we have created designated Provider Care Teams.  These Care Teams include your primary Cardiologist (physician) and Advanced Practice Providers (APPs -  Physician Assistants and Nurse Practitioners) who all work together to provide you with the care you need, when you need it.  We recommend signing up for the patient portal called "MyChart".  Sign up information is provided on this After Visit Summary.  MyChart is used to connect with patients for Virtual Visits (Telemedicine).  Patients  are able to view lab/test results, encounter notes, upcoming appointments, etc.  Non-urgent messages can be sent to your provider as well.   To learn more about what you can do with MyChart, go to [email or web address].    Your next appointment:   3-4 months   Provider:   Little Ishikawa, MD   Other Instructions How to Prepare for Your Myoview Test (stress test):  Please do not take these medications before your test: metoprolol  (please note if this is an exercise test pt should hold beta blocker prior) Your remaining medications may be taken with water. Nothing to eat or drink, except water, 4 hours prior to arrival time.  NO caffeine/decaffeinated products, or chocolate 12 hours prior to arrival. [place], please do not wear dresses.  Skirts or pants are approprate, please wear a short sleeve shirt. NO perfume, cologne or lotion Wear comfortable walking shoes.  NO HEELS! Total time is 3 to 4 hours; you may want to bring reading material for the waiting time. Please report to [facility] for your test  What to expect after you arrive:  Once you arrive and check in for your appointment an IV will be started in your arm.  Then the Technoligist will inject a small amount of radioactive tracer.  There will be a 1 hour waiting  period after this injection.  A series of pictures will be taken of your heart following this waiting period.  You will be prepped for the stress portion of the test.  During the stress portion of your test you will either walk on a treadmill or receive a small, safe amount of radioactive tracer injected in your IV.  After the stress portion, there is a short rest period during which time your heart and blood pressure will be monitored.  After the short rest period the Technologist will begin your second set of pictures.  Your doctor will inform you of your test results within 7-10 business days.  In preparation for your appointment, medication and supplies will be purchased.  Appointment availability is limited, so if you need to cancel or reschedule please call the office at [phone] 24 hours in advance to avoid a cancellation fee of $100.00  IF YOU THINK YOU MAY BE PREGNANT, PLEASE INFORM THE TECHNOLOGIST.     Signed, Corrin Parker, PA-C  07/17/2022 5:37 PM    [facility]

## 2022-07-17 ENCOUNTER — Ambulatory Visit (INDEPENDENT_AMBULATORY_CARE_PROVIDER_SITE_OTHER): Payer: No Typology Code available for payment source

## 2022-07-17 ENCOUNTER — Encounter: Payer: Self-pay | Admitting: Student

## 2022-07-17 ENCOUNTER — Ambulatory Visit: Payer: No Typology Code available for payment source | Attending: Student | Admitting: Student

## 2022-07-17 VITALS — BP 118/82 | HR 75 | Ht 68.0 in | Wt 165.0 lb

## 2022-07-17 DIAGNOSIS — R079 Chest pain, unspecified: Secondary | ICD-10-CM

## 2022-07-17 DIAGNOSIS — I10 Essential (primary) hypertension: Secondary | ICD-10-CM

## 2022-07-17 DIAGNOSIS — R002 Palpitations: Secondary | ICD-10-CM

## 2022-07-17 DIAGNOSIS — E785 Hyperlipidemia, unspecified: Secondary | ICD-10-CM

## 2022-07-17 DIAGNOSIS — R0989 Other specified symptoms and signs involving the circulatory and respiratory systems: Secondary | ICD-10-CM | POA: Diagnosis not present

## 2022-07-17 DIAGNOSIS — I2510 Atherosclerotic heart disease of native coronary artery without angina pectoris: Secondary | ICD-10-CM

## 2022-07-17 NOTE — Patient Instructions (Addendum)
Medication Instructions:  Your physician recommends that you continue on your current medications as directed. Please refer to the Current Medication list given to you today.  *If you need a refill on your cardiac medications before your next appointment, please call your pharmacy*   Lab Work: Your physician recommends that you have the following labs drawn the same day as your Lexiscan: Lipid and LFT If you have labs (blood work) drawn today and your tests are completely normal, you will receive your results only by: MyChart Message (if you have MyChart) OR A paper copy in the mail If you have any lab test that is abnormal or we need to change your treatment, we will call you to review the results.   Testing/Procedures: Your physician has requested that you have a lexiscan myoview. For further information please visit [email or web address] Please follow instruction sheet, as given.   Your physician has requested that you have a carotid duplex. This test is an ultrasound of the carotid arteries in your neck. It looks at blood flow through these arteries that supply the brain with blood. Allow one hour for this exam. There are no restrictions or special instructions.   ZIO XT- Long Term Monitor Instructions  Your physician has requested you wear a ZIO patch monitor for 14 days.  This is a single patch monitor. [employer] supplies one patch monitor per enrollment. Additional stickers are not available. Please do not apply patch if you will be having a Nuclear Stress Test,  Echocardiogram, Cardiac CT, MRI, or Chest Xray during the period you would be wearing the  monitor. The patch cannot be worn during these tests. You cannot remove and re-apply the  ZIO XT patch monitor.  Your ZIO patch monitor will be mailed 3 day USPS to your address on file. It may take 3-5 days  to receive your monitor after you have been enrolled.  Once you have received your monitor, please review the enclosed  instructions. Your monitor  has already been registered assigning a specific monitor serial # to you.  Billing and Patient Assistance Program Information  We have supplied [employer] with any of your insurance information on file for billing purposes. [employer] offers a sliding scale Patient Assistance Program for patients that do not have  insurance, or whose insurance does not completely cover the cost of the ZIO monitor.  You must apply for the Patient Assistance Program to qualify for this discounted rate.  To apply, please call [employer] at [phone], select option 4, select option 2, ask to apply for  Patient Assistance Program. Meredeth Ide will ask your household income, and how many people  are in your household. They will quote your out-of-pocket cost based on that information.  [employer] will also be able to set up a 71-month, interest-free payment plan if needed.  Applying the monitor   Shave hair from upper left chest.  Hold abrader disc by orange tab. Rub abrader in 40 strokes over the upper left chest as  indicated in your monitor instructions.  Clean area with 4 enclosed alcohol pads. Let dry.  Apply patch as indicated in monitor instructions. Patch will be placed under collarbone on left  side of chest with arrow pointing upward.  Rub patch adhesive wings for 2 minutes. Remove white label marked "1". Remove the white  label marked "2". Rub patch adhesive wings for 2 additional minutes.  While looking in a mirror, press and release button in center of patch. A small green light will  flash 3-4 times. This will be your only indicator that the monitor has been turned on.  Do not shower for the first 24 hours. You may shower after the first 24 hours.  Press the button if you feel a symptom. You will hear a small click. Record Date, Time and  Symptom in the Patient Logbook.  When you are ready to remove the patch, follow instructions on the last 2 pages of Patient  Logbook. Stick patch  monitor onto the last page of Patient Logbook.  Place Patient Logbook in the blue and white box. Use locking tab on box and tape box closed  securely. The blue and white box has prepaid postage on it. Please place it in the mailbox as  soon as possible. Your physician should have your test results approximately 7 days after the  monitor has been mailed back to [facility].  Call [facility] Customer Care at [phone] if you have questions regarding  your ZIO XT patch monitor. Call them immediately if you see an orange light blinking on your  monitor.  If your monitor falls off in less than 4 days, contact our Monitor department at [phone].  If your monitor becomes loose or falls off after 4 days call [employer] at [phone] for  suggestions on securing your monitor   Follow-Up: At [facility], you and your health needs are our priority.  As part of our continuing mission to provide you with exceptional heart care, we have created designated Provider Care Teams.  These Care Teams include your primary Cardiologist (physician) and Advanced Practice Providers (APPs -  Physician Assistants and Nurse Practitioners) who all work together to provide you with the care you need, when you need it.  We recommend signing up for the patient portal called "MyChart".  Sign up information is provided on this After Visit Summary.  MyChart is used to connect with patients for Virtual Visits (Telemedicine).  Patients are able to view lab/test results, encounter notes, upcoming appointments, etc.  Non-urgent messages can be sent to your provider as well.   To learn more about what you can do with MyChart, go to [email or web address].    Your next appointment:   3-4 months   Provider:   Little Ishikawa, MD   Other Instructions How to Prepare for Your Myoview Test (stress test):  Please do not take these medications before your test: metoprolol  (please note if this is an  exercise test pt should hold beta blocker prior) Your remaining medications may be taken with water. Nothing to eat or drink, except water, 4 hours prior to arrival time.  NO caffeine/decaffeinated products, or chocolate 12 hours prior to arrival. [place], please do not wear dresses.  Skirts or pants are approprate, please wear a short sleeve shirt. NO perfume, cologne or lotion Wear comfortable walking shoes.  NO HEELS! Total time is 3 to 4 hours; you may want to bring reading material for the waiting time. Please report to [facility] for your test  What to expect after you arrive:  Once you arrive and check in for your appointment an IV will be started in your arm.  Then the Technoligist will inject a small amount of radioactive tracer.  There will be a 1 hour waiting period after this injection.  A series of pictures will be taken of your heart following this waiting period.  You will be prepped for the stress portion of the test.  During the stress portion of your  test you will either walk on a treadmill or receive a small, safe amount of radioactive tracer injected in your IV.  After the stress portion, there is a short rest period during which time your heart and blood pressure will be monitored.  After the short rest period the Technologist will begin your second set of pictures.  Your doctor will inform you of your test results within 7-10 business days.  In preparation for your appointment, medication and supplies will be purchased.  Appointment availability is limited, so if you need to cancel or reschedule please call the office at [phone] 24 hours in advance to avoid a cancellation fee of $100.00  IF YOU THINK YOU MAY BE PREGNANT, PLEASE INFORM THE TECHNOLOGIST.

## 2022-07-17 NOTE — Progress Notes (Unsigned)
Enrolled for [employer] to mail a ZIO XT long term holter monitor to the patients address on file.  ? ?Dr. Schumann to read. ?

## 2022-07-23 DIAGNOSIS — R002 Palpitations: Secondary | ICD-10-CM

## 2022-07-24 ENCOUNTER — Encounter (HOSPITAL_COMMUNITY): Payer: Self-pay

## 2022-07-29 ENCOUNTER — Encounter: Payer: Self-pay | Admitting: Family Medicine

## 2022-07-30 ENCOUNTER — Ambulatory Visit (HOSPITAL_COMMUNITY)
Admission: RE | Admit: 2022-07-30 | Discharge: 2022-07-30 | Disposition: A | Discharge status: Home / Self Care | Payer: No Typology Code available for payment source | Source: Ambulatory Visit | Attending: Cardiology | Admitting: Cardiology

## 2022-07-30 ENCOUNTER — Ambulatory Visit: Payer: No Typology Code available for payment source | Admitting: Physician Assistant

## 2022-07-30 ENCOUNTER — Telehealth: Payer: Self-pay

## 2022-07-30 VITALS — BP 120/78 | HR 98 | Ht 68.0 in | Wt 130.0 lb

## 2022-07-30 DIAGNOSIS — Z0000 Encounter for general adult medical examination without abnormal findings: Secondary | ICD-10-CM

## 2022-07-30 DIAGNOSIS — R0989 Other specified symptoms and signs involving the circulatory and respiratory systems: Secondary | ICD-10-CM | POA: Insufficient documentation

## 2022-07-30 DIAGNOSIS — N946 Dysmenorrhea, unspecified: Secondary | ICD-10-CM | POA: Diagnosis not present

## 2022-07-30 DIAGNOSIS — R5383 Other fatigue: Secondary | ICD-10-CM

## 2022-07-30 NOTE — Telephone Encounter (Signed)
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 Patient declined and stated that she was able to quit on her own. Patient has been discharged from the health coaching program.   Renaee Munda, MS, ERHD, [facility]  Care Guide, Health & Wellness Coach [address] [place] [address] Telephone: [phone] Email: [email or web address]

## 2022-07-30 NOTE — Progress Notes (Unsigned)
Established Patient Office Visit  Subjective   Patient ID: Susan Sexton, female    DOB: 1985-08-10  Age: 37 y.o. MRN: [id number]  Chief Complaint  Patient presents with   Headache   Fatigue    Symptoms started yesterday     States that she started feeling poorly yesterday, describes a very low energy, headahces, muscle aches in chest area, measured temperature of 101.1.  States that she suffers from dysmenorrhea, states that she will experience heavy bleeding, cramping, episodes of vomiting during her menses.  States that her menses ended yesterday.  Denies sick contacts.  States that she tried laying down and drinking water mild relief.  Does endorse that she has been experiencing episodes like this approximately twice a week for the past several weeks.   Past Medical History:  Diagnosis Date   Asthma    Coronary artery disease    High cholesterol    Hyperlipidemia LDL goal <100 05/04/2020   NSTEMI (non-ST elevated myocardial infarction) [facility])    Social History   Socioeconomic History   Marital status: Single    Spouse name: Not on file   Number of children: Not on file   Years of education: 11   Highest education level: 11th grade  Occupational History   Not on file  Tobacco Use   Smoking status: Former    Packs/day: .5    Types: Cigarettes    Quit date: 05/06/2020    Years since quitting: 2.2   Smokeless tobacco: Never   Tobacco comments:    Ramona was given 1-800-quit-now. Marylin started back smoking 06/07/22.  Vaping Use   Vaping Use: Former   Quit date: 05/06/2020  Substance and Sexual Activity   Alcohol use: No   Drug use: No   Sexual activity: Yes    Birth control/protection: None  Other Topics Concern   Not on file  Social History Narrative   Not on file   Social Determinants of Health   Financial Resource Strain: Medium Risk (02/26/2022)   Overall Financial Resource Strain (CARDIA)    Difficulty of Paying Living Expenses: Somewhat hard  Food  Insecurity: Food Insecurity Present (02/26/2022)   Hunger Vital Sign    Worried About Running Out of Food in the Last Year: Sometimes true    Ran Out of Food in the Last Year: Sometimes true  Transportation Needs: No Transportation Needs (02/26/2022)   PRAPARE - Administrator, Civil Service (Medical): No    Lack of Transportation (Non-Medical): No  Physical Activity: Not on file  Stress: Stress Concern Present (10/19/2021)   [employer] of Occupational Health - Occupational Stress Questionnaire    Feeling of Stress : Rather much  Social Connections: Not on file  Intimate Partner Violence: Not on file   Family History  Problem Relation Age of Onset   Hypertension Mother    Heart attack Father 65       Had CABG, but died at age 75 after trauma   No Known Allergies  Review of Systems  Constitutional:  Positive for fever and malaise/fatigue.  HENT: Negative.    Eyes: Negative.   Respiratory:  Negative for shortness of breath.   Cardiovascular:  Negative for chest pain.  Gastrointestinal:  Negative for nausea and vomiting.  Genitourinary: Negative.   Musculoskeletal:  Positive for myalgias.  Skin: Negative.   Neurological:  Positive for headaches. Negative for dizziness and weakness.  Endo/Heme/Allergies: Negative.   Psychiatric/Behavioral: Negative.  Objective:     BP 120/78 (BP Location: Left Arm, Patient Position: Sitting, Cuff Size: Normal)   Pulse 98   Ht 5\' 8"  (1.727 m)   Wt 130 lb (59 kg)   BMI 19.77 kg/m  BP Readings from Last 3 Encounters:  07/30/22 120/78  07/17/22 118/82  07/04/22 118/72   Wt Readings from Last 3 Encounters:  07/30/22 130 lb (59 kg)  07/17/22 165 lb (74.8 kg)  07/04/22 165 lb 6.4 oz (75 kg)      Physical Exam Vitals and nursing note reviewed.  Constitutional:      General: She is not in acute distress.    Appearance: Normal appearance.  HENT:     Head: Normocephalic and atraumatic.     Right Ear: External  ear normal.     Left Ear: External ear normal.     Nose: Nose normal.     Mouth/Throat:     Mouth: Mucous membranes are moist.     Pharynx: Oropharynx is clear.  Eyes:     Extraocular Movements: Extraocular movements intact.     Conjunctiva/sclera: Conjunctivae normal.     Pupils: Pupils are equal, round, and reactive to light.  Cardiovascular:     Rate and Rhythm: Normal rate and regular rhythm.     Pulses: Normal pulses.     Heart sounds: Normal heart sounds.  Pulmonary:     Effort: Pulmonary effort is normal.     Breath sounds: Normal breath sounds.  Musculoskeletal:        General: Normal range of motion.     Cervical back: Normal range of motion.  Skin:    General: Skin is warm and dry.  Neurological:     General: No focal deficit present.     Mental Status: She is alert and oriented to person, place, and time.  Psychiatric:        Mood and Affect: Mood normal.        Behavior: Behavior normal.        Thought Content: Thought content normal.        Judgment: Judgment normal.        Assessment & Plan:   Problem List Items Addressed This Visit   None Visit Diagnoses     Fatigue, unspecified type    -  Primary   Relevant Orders   CBC with Differential/Platelet (Completed)   Basic metabolic panel (Completed)   TSH (Completed)   Dysmenorrhea       Relevant Orders   Ambulatory referral to Gynecology      1. Fatigue, unspecified type Patient education given on supportive care, red flags given for prompt reevaluation - CBC with Differential/Platelet - Basic metabolic panel - TSH  2. Dysmenorrhea  - Ambulatory referral to Gynecology   I have reviewed the patient's medical history (PMH, PSH, Social History, Family History, Medications, and allergies) , and have been updated if relevant. I spent 21 minutes reviewing chart and  face to face time with patient.    Return if symptoms worsen or fail to improve.    Kasandra Knudsen Mayers, PA-C

## 2022-07-30 NOTE — Patient Instructions (Signed)
I started a referral for you to be seen by gynecology for evaluation of your painful menses.  We will call you with today's lab results.  I hope that you feel better soon.  Roney Jaffe, PA-C Physician Assistant [facility] Medicine [email or web address]   Fatigue If you have fatigue, you feel tired all the time and have a lack of energy or a lack of motivation. Fatigue may make it difficult to start or complete tasks because of exhaustion. Occasional or mild fatigue is often a normal response to activity or life. However, long-term (chronic) or extreme fatigue may be a symptom of a medical condition such as: Depression. Not having enough red blood cells or hemoglobin in the blood (anemia). A problem with a small gland located in the lower front part of the neck (thyroid disorder). Rheumatologic conditions. These are problems related to the body's defense system (immune system). Infections, especially certain viral infections. Fatigue can also lead to negative health outcomes over time. Follow these instructions at home: Medicines Take over-the-counter and prescription medicines only as told by your health care provider. Take a multivitamin if told by your health care provider. Do not use herbal or dietary supplements unless they are approved by your health care provider. Eating and drinking  Avoid heavy meals in the evening. Eat a well-balanced diet, which includes lean proteins, whole grains, plenty of fruits and vegetables, and low-fat dairy products. Avoid eating or drinking too many products with caffeine in them. Avoid alcohol. Drink enough fluid to keep your urine pale yellow. Activity  Exercise regularly, as told by your health care provider. Use or practice techniques to help you relax, such as yoga, tai chi, meditation, or massage therapy. Lifestyle Change situations that cause you stress. Try to keep your work and personal  schedules in balance. Do not use recreational or illegal drugs. General instructions Monitor your fatigue for any changes. Go to bed and get up at the same time every day. Avoid fatigue by pacing yourself during the day and getting enough sleep at night. Maintain a healthy weight. Contact a health care provider if: Your fatigue does not get better. You have a fever. You suddenly lose or gain weight. You have headaches. You have trouble falling asleep or sleeping through the night. You feel angry, guilty, anxious, or sad. You have swelling in your legs or another part of your body. Get help right away if: You feel confused, feel like you might faint, or faint. Your vision is blurry or you have a severe headache. You have severe pain in your abdomen, your back, or the area between your waist and hips (pelvis). You have chest pain, shortness of breath, or an irregular or fast heartbeat. You are unable to urinate, or you urinate less than normal. You have abnormal bleeding from the rectum, nose, lungs, nipples, or, if you are female, the vagina. You vomit blood. You have thoughts about hurting yourself or others. These symptoms may be an emergency. Get help right away. Call 911. Do not wait to see if the symptoms will go away. Do not drive yourself to the hospital. Get help right away if you feel like you may hurt yourself or others, or have thoughts about taking your own life. Go to your nearest emergency room or: Call 911. Call the National Suicide Prevention Lifeline at 7655116083 or 988. This is open 24 hours a day. Text the Crisis Text Line at (205)355-1374. Summary If you have fatigue, you feel tired all  the time and have a lack of energy or a lack of motivation. Fatigue may make it difficult to start or complete tasks because of exhaustion. Long-term (chronic) or extreme fatigue may be a symptom of a medical condition. Exercise regularly, as told by your health care  provider. Change situations that cause you stress. Try to keep your work and personal schedules in balance. This information is not intended to replace advice given to you by your health care provider. Make sure you discuss any questions you have with your health care provider. Document Revised: 12/18/2020 Document Reviewed: 12/18/2020 Elsevier Patient Education  2023 [employer].

## 2022-07-31 ENCOUNTER — Encounter: Payer: Self-pay | Admitting: Physician Assistant

## 2022-07-31 LAB — CBC WITH DIFFERENTIAL/PLATELET
Basophils Absolute: 0.1 10*3/uL (ref 0.0–0.2)
Basos: 0 %
EOS (ABSOLUTE): 0.1 10*3/uL (ref 0.0–0.4)
Eos: 0 %
Hematocrit: 39.1 % (ref 34.0–46.6)
Hemoglobin: 12.9 g/dL (ref 11.1–15.9)
Immature Grans (Abs): 0 10*3/uL (ref 0.0–0.1)
Immature Granulocytes: 0 %
Lymphocytes Absolute: 3.3 10*3/uL — ABNORMAL HIGH (ref 0.7–3.1)
Lymphs: 21 %
MCH: 30.6 pg (ref 26.6–33.0)
MCHC: 33 g/dL (ref 31.5–35.7)
MCV: 93 fL (ref 79–97)
Monocytes Absolute: 2.1 10*3/uL — ABNORMAL HIGH (ref 0.1–0.9)
Monocytes: 14 %
Neutrophils Absolute: 9.9 10*3/uL — ABNORMAL HIGH (ref 1.4–7.0)
Neutrophils: 65 %
Platelets: 268 10*3/uL (ref 150–450)
RBC: 4.22 x10E6/uL (ref 3.77–5.28)
RDW: 12.1 % (ref 11.7–15.4)
WBC: 15.4 10*3/uL — ABNORMAL HIGH (ref 3.4–10.8)

## 2022-07-31 LAB — BASIC METABOLIC PANEL
BUN/Creatinine Ratio: 15 (ref 9–23)
BUN: 11 mg/dL (ref 6–20)
CO2: 24 mmol/L (ref 20–29)
Calcium: 9.7 mg/dL (ref 8.7–10.2)
Chloride: 98 mmol/L (ref 96–106)
Creatinine, Ser: 0.75 mg/dL (ref 0.57–1.00)
Glucose: 80 mg/dL (ref 70–99)
Potassium: 3.6 mmol/L (ref 3.5–5.2)
Sodium: 138 mmol/L (ref 134–144)
eGFR: 106 mL/min/{1.73_m2} (ref >59)

## 2022-07-31 LAB — TSH: TSH: 1.49 u[IU]/mL (ref 0.450–4.500)

## 2022-08-01 ENCOUNTER — Telehealth: Payer: Self-pay | Admitting: Cardiology

## 2022-08-01 ENCOUNTER — Ambulatory Visit (HOSPITAL_COMMUNITY): Payer: BC Managed Care – PPO | Attending: Student

## 2022-08-01 DIAGNOSIS — R079 Chest pain, unspecified: Secondary | ICD-10-CM

## 2022-08-01 LAB — MYOCARDIAL PERFUSION IMAGING
LV dias vol: 50 mL (ref 46–106)
LV sys vol: 22 mL
Nuc Stress EF: 57 %
Peak HR: 134 {beats}/min
Rest HR: 82 {beats}/min
Rest Nuclear Isotope Dose: 10.4 mCi
SDS: 4
SRS: 1
SSS: 5
Stress Nuclear Isotope Dose: 31.8 mCi
TID: 1.06

## 2022-08-01 MED ORDER — TECHNETIUM TC 99M TETROFOSMIN IV KIT
31.8000 | PACK | Freq: Once | INTRAVENOUS | Status: AC | PRN
  Administered 2022-08-01: 31.8 via INTRAVENOUS

## 2022-08-01 MED ORDER — TECHNETIUM TC 99M TETROFOSMIN IV KIT
10.4000 | PACK | Freq: Once | INTRAVENOUS | Status: AC | PRN
  Administered 2022-08-01: 10.4 via INTRAVENOUS

## 2022-08-01 MED ORDER — REGADENOSON 0.4 MG/5ML IV SOLN
0.4000 mg | Freq: Once | INTRAVENOUS | Status: AC
  Administered 2022-08-01: 0.4 mg via INTRAVENOUS

## 2022-08-01 NOTE — Telephone Encounter (Signed)
Patient states she brought FMLA paperwork to office. She was going to get it at her OV.  She states she was told that they felt her need for FMLA was not based on cardiac issues.  She's states it has taken so long that now she may lose her job.  Advised that I would send this over as I was aware that this had been looked at by the nurse and provider

## 2022-08-01 NOTE — Telephone Encounter (Signed)
New Message:       Patient would like to have Susan Sexton to please call her today, concerning her FMLA papers.

## 2022-08-01 NOTE — Telephone Encounter (Signed)
Haskell Riling! Yes ma'am, she asked me to fill out the FMLA paperwork at our visit but I told her I did not feel comfortably doing this because I do not think that she she needs FMLA from a cardiac standpoint. She has a long history of intermittent chest pain related to stress and anxiety. EF is normal. I offered to refer her to Psychiatry for her stress and anxiety but she declined and said "I know what makes me stressed - its my job." I did order a stress test for further evaluation of her chest pain and that is getting done today so we can wait for the results of that but based off everything we have right now I don't think needs FMLA from a cardiac standpoint. She also has a lot of other complains. Looks like she saw her PCP earlier this week for fatigue and dysmenorrhea. So I think if she needs FMLA, then it needs to come from her PCP. I explained this to her at our visit as well.   Thanks! Keithan Dileonardo

## 2022-08-01 NOTE — Telephone Encounter (Signed)
Discussed with Dr. Gean Birchwood unable to complete FMLA paperwork. Advised to discuss with PCP.

## 2022-08-01 NOTE — Telephone Encounter (Signed)
Called patient to discuss FMLA.  Patient states when she was in to see [facility] PA she was told we could not complete this.  Patient reports she continues to have multiple issues and had multiple doctor visits which has caused her to miss work.   She reports when palpitations start at work she becomes weak and is unable to continue working.  This has been occurring for the last 5-6 weeks.   She is currently wearing heart monitor.     She states she spoke to PCP about this this week and was advised to discuss with Dr. Bjorn Pippin.  She reports needing FMLA for appointments as well as days she misses due to ongoing symptoms (reports palpitations, fever, fatigue, etc).       She also reports she needs a letter to return to work although if [employer] not completed she will lose her job.  Advised PCP may be best to address this as her symptoms may not be cardiac related but would discuss with Dr. Bjorn Pippin and return call.   She will call her PCP now as well.

## 2022-08-08 ENCOUNTER — Ambulatory Visit: Payer: No Typology Code available for payment source | Admitting: Physician Assistant

## 2022-08-13 ENCOUNTER — Telehealth: Payer: Self-pay | Admitting: Cardiology

## 2022-08-13 ENCOUNTER — Telehealth: Payer: Self-pay | Admitting: Family Medicine

## 2022-08-13 NOTE — Telephone Encounter (Signed)
Spoke with patient to discuss her concerns. She has reached out to PCP to fill out FMLA paperwork. Thanked her for providing feedback. Pt verbalized appreciation of call back.

## 2022-08-13 NOTE — Telephone Encounter (Signed)
Spoke with patient and she requested to speak with Management regarding her FMLA paperwork. From previous encounter 5/23 [facility] stated she informed patient she did not think patient needed FMLA from a cardiac standpoint. She needed to reach out to her PCP to fill out per Dr. Bjorn Pippin.   She states she was informed that we will fill out forms and held on to the forms for so long just to say we  will not complete it. She states she got fired because of all of the doctors appointments because of her hear attack and heart condition. She would like a call back today.

## 2022-08-13 NOTE — Telephone Encounter (Signed)
Pt called in states she will fax in [facility] paperwork today. She thought she had to wait until her appt.

## 2022-08-13 NOTE — Telephone Encounter (Signed)
Call placed to patient unable to reach message left on VM. Message left to return call. Inquiring if the paperwork for FMLA has been given to PCP

## 2022-08-13 NOTE — Telephone Encounter (Signed)
See 5/23 encounter  Patient is following up. She states she was originally advised that the FMLA paperwork could be completed, but during her last visit at the office whoever she spoke with (patient does not remember the correct name) advised her that they did not think that heart heart condition was a contributing factor and denied completing the form. Patient states she lost her job because she was unable to provide documentation and she would like a call back to discuss.

## 2022-08-13 NOTE — Telephone Encounter (Signed)
Pt states that she was told by Dr. Epifanio Lesches office that she would have to have her PCP to fill out her FMLA office. Pt states that she was trying to have Dr. Bjorn Pippin office to fill out the [facility] paperwork since he is her Cardiologist and this is regarding her heart attack and heart condition.   Pt has missed several days of work due to doctors appointment and her HR is stating this need to be filled out to save her job.  Please advise.

## 2022-09-26 ENCOUNTER — Ambulatory Visit: Payer: No Typology Code available for payment source | Attending: Family Medicine | Admitting: Family Medicine

## 2022-09-26 VITALS — BP 120/77 | HR 70 | Temp 98.2°F | Ht 68.0 in | Wt 161.0 lb

## 2022-09-26 DIAGNOSIS — N979 Female infertility, unspecified: Secondary | ICD-10-CM | POA: Diagnosis not present

## 2022-09-26 DIAGNOSIS — N939 Abnormal uterine and vaginal bleeding, unspecified: Secondary | ICD-10-CM

## 2022-09-26 DIAGNOSIS — Z1159 Encounter for screening for other viral diseases: Secondary | ICD-10-CM | POA: Diagnosis not present

## 2022-09-26 NOTE — Progress Notes (Signed)
Subjective:  Patient ID: Susan Sexton, female    DOB: 1986-03-03  Age: 37 y.o. MRN: [id number]  CC: Menorrhagia   HPI Susan Sexton is a 37 y.o. year old female with a history of CAD status post DES, nicotine dependence who presents today for an acute visit.   Interval History: Discussed the use of AI scribe software for clinical note transcription with the patient, who gave verbal consent to proceed.   She presents with abnormal uterine bleeding since taking Plan B. She reports a history of irregular, heavy, and painful periods, with clots passed every month. The patient's periods have been more frequent this year, often starting at the end of the month and rolling over into the next. Despite a period tracker indicating a 28-day cycle, the patient feels her periods are more frequent.  The patient also reports severe pain during periods, with symptoms including nausea, vomiting, and extreme discomfort and heat sensation leading to undressing regardless of location. She has experienced these symptoms for years, with no clear explanation provided to her.  The patient also mentions a recent sexual encounter and expresses a desire to become pregnant. She has concerns about her fertility due to a miscarriage at 3 and no subsequent pregnancies. She also expresses concerns about her heart condition and its potential impact on a pregnancy.        Past Medical History:  Diagnosis Date   Asthma    Coronary artery disease    High cholesterol    Hyperlipidemia LDL goal <100 05/04/2020   NSTEMI (non-ST elevated myocardial infarction) [facility])     Past Surgical History:  Procedure Laterality Date   BREAST LUMPECTOMY Left 06/6/219   CARDIAC CATHETERIZATION     CORONARY STENT INTERVENTION N/A 05/05/2020   Procedure: CORONARY STENT INTERVENTION;  Surgeon: Iran Ouch, MD;  Location: [facility] INVASIVE CV LAB;  Service: Cardiovascular;  Laterality: N/A;   CORONARY STENT INTERVENTION N/A  05/23/2020   Procedure: CORONARY STENT INTERVENTION;  Surgeon: Corky Crafts, MD;  Location: [facility] INVASIVE CV LAB;  Service: Cardiovascular;  Laterality: N/A;   CORONARY STENT PLACEMENT  05/05/2020   CORONARY ULTRASOUND/IVUS N/A 05/23/2020   Procedure: Intravascular Ultrasound/IVUS;  Surgeon: Corky Crafts, MD;  Location: [facility] INVASIVE CV LAB;  Service: Cardiovascular;  Laterality: N/A;   INCISION AND DRAINAGE BREAST ABSCESS Left 09/2017   LEFT HEART CATH AND CORONARY ANGIOGRAPHY N/A 05/05/2020   Procedure: LEFT HEART CATH AND CORONARY ANGIOGRAPHY;  Surgeon: Iran Ouch, MD;  Location: [facility] INVASIVE CV LAB;  Service: Cardiovascular;  Laterality: N/A;   LEFT HEART CATH AND CORONARY ANGIOGRAPHY N/A 05/23/2020   Procedure: LEFT HEART CATH AND CORONARY ANGIOGRAPHY;  Surgeon: Corky Crafts, MD;  Location: [facility] INVASIVE CV LAB;  Service: Cardiovascular;  Laterality: N/A;    Family History  Problem Relation Age of Onset   Hypertension Mother    Heart attack Father 62       Had CABG, but died at age 59 after trauma    Social History   Socioeconomic History   Marital status: Single    Spouse name: Not on file   Number of children: Not on file   Years of education: 83   Highest education level: 11th grade  Occupational History   Not on file  Tobacco Use   Smoking status: Former    Current packs/day: 0.00    Types: Cigarettes    Quit date: 05/06/2020    Years since quitting: 2.3  Smokeless tobacco: Never   Tobacco comments:    Kada was given 1-800-quit-now. Demi started back smoking 06/07/22.  Vaping Use   Vaping status: Former   Quit date: 05/06/2020  Substance and Sexual Activity   Alcohol use: No   Drug use: No   Sexual activity: Yes    Birth control/protection: None  Other Topics Concern   Not on file  Social History Narrative   Not on file   Social Determinants of Health   Financial Resource Strain: Medium Risk (02/26/2022)   Overall Financial  Resource Strain (CARDIA)    Difficulty of Paying Living Expenses: Somewhat hard  Food Insecurity: Food Insecurity Present (02/26/2022)   Hunger Vital Sign    Worried About Running Out of Food in the Last Year: Sometimes true    Ran Out of Food in the Last Year: Sometimes true  Transportation Needs: No Transportation Needs (02/26/2022)   PRAPARE - Administrator, Civil Service (Medical): No    Lack of Transportation (Non-Medical): No  Physical Activity: Not on file  Stress: Stress Concern Present (10/19/2021)   [employer] of Occupational Health - Occupational Stress Questionnaire    Feeling of Stress : Rather much  Social Connections: Unknown (07/08/2022)   Received from [facility], [facility]   Social Network    Social Network: Not on file    No Known Allergies  Outpatient Medications Prior to Visit  Medication Sig Dispense Refill   acetaminophen (TYLENOL) 500 MG tablet Take 500-1,000 mg by mouth every 6 (six) hours as needed (for pain.).     albuterol (VENTOLIN HFA) 108 (90 Base) MCG/ACT inhaler Inhale 2 puffs into the lungs every 6 (six) hours as needed for wheezing or shortness of breath. NEEDS PASS 18 g 1   aspirin 81 MG chewable tablet Chew 1 tablet (81 mg total) by mouth daily.     atorvastatin (LIPITOR) 80 MG tablet Take 1 tablet (80 mg total) by mouth daily. 90 tablet 3   Evolocumab (REPATHA SURECLICK) 140 MG/ML SOAJ Inject 140 mg into the skin every 14 (fourteen) days. 6 mL 3   ezetimibe (ZETIA) 10 MG tablet Take 1 tablet (10 mg total) by mouth daily. 90 tablet 3   metoprolol succinate (TOPROL-XL) 25 MG 24 hr tablet Take 1/2 tablet (12.5 mg total) by mouth daily. (Patient taking differently: Take 12.5 mg by mouth daily. In The Evening) 45 tablet 3   Multiple Vitamins-Minerals (MULTI ADULT GUMMIES PO) Take 1 tablet by mouth daily.     nitroGLYCERIN (NITROSTAT) 0.4 MG SL tablet Place 1 tablet (0.4 mg total) under the tongue every 5 (five) minutes x 3 doses  as needed for chest pain. 25 tablet 2   ticagrelor (BRILINTA) 90 MG TABS tablet Take 1 tablet (90 mg total) by mouth 2 (two) times daily. 180 tablet 3   nitrofurantoin, macrocrystal-monohydrate, (MACROBID) 100 MG capsule Take 1 capsule (100 mg total) by mouth 2 (two) times daily. (Patient not taking: Reported on 09/26/2022) 10 capsule 0   No facility-administered medications prior to visit.     ROS Review of Systems  Constitutional:  Negative for activity change and appetite change.  HENT:  Negative for sinus pressure and sore throat.   Respiratory:  Negative for chest tightness, shortness of breath and wheezing.   Cardiovascular:  Negative for chest pain and palpitations.  Gastrointestinal:  Negative for abdominal distention, abdominal pain and constipation.  Genitourinary:  Positive for menstrual problem.  Musculoskeletal: Negative.   Psychiatric/Behavioral:  Negative for behavioral problems and dysphoric mood.     Objective:  BP 120/77   Pulse 70   Temp 98.2 F (36.8 C) (Oral)   Ht 5\' 8"  (1.727 m)   Wt 161 lb (73 kg)   SpO2 99%   BMI 24.48 kg/m      09/26/2022   11:43 AM 08/01/2022   11:09 AM 07/30/2022    4:13 PM  BP/Weight  Systolic BP 120  409  Diastolic BP 77  78  Wt. (Lbs) 161 130 130  BMI 24.48 kg/m2 19.77 kg/m2 19.77 kg/m2      Physical Exam Constitutional:      Appearance: She is well-developed.  Cardiovascular:     Rate and Rhythm: Normal rate.     Heart sounds: Normal heart sounds. No murmur heard. Pulmonary:     Effort: Pulmonary effort is normal.     Breath sounds: Normal breath sounds. No wheezing or rales.  Chest:     Chest wall: No tenderness.  Abdominal:     General: Bowel sounds are normal. There is no distension.     Palpations: Abdomen is soft. There is no mass.     Tenderness: There is no abdominal tenderness.  Musculoskeletal:        General: Normal range of motion.     Right lower leg: No edema.     Left lower leg: No edema.   Neurological:     Mental Status: She is alert and oriented to person, place, and time.  Psychiatric:        Mood and Affect: Mood normal.        Latest Ref Rng & Units 07/30/2022    4:41 PM 05/04/2022    1:33 AM 07/10/2021    9:00 AM  CMP  Glucose 70 - 99 mg/dL 80  811  914   BUN 6 - 20 mg/dL 11  18  10    Creatinine 0.57 - 1.00 mg/dL 7.82  9.56  2.13   Sodium 134 - 144 mmol/L 138  135  137   Potassium 3.5 - 5.2 mmol/L 3.6  3.7  3.7   Chloride 96 - 106 mmol/L 98  104  105   CO2 20 - 29 mmol/L 24  24  25    Calcium 8.7 - 10.2 mg/dL 9.7  9.1  9.4   Total Protein 6.5 - 8.1 g/dL  7.7    Total Bilirubin 0.3 - 1.2 mg/dL  0.6    Alkaline Phos 38 - 126 U/L  85    AST 15 - 41 U/L  64    ALT 0 - 44 U/L  25      Lipid Panel     Component Value Date/Time   CHOL 212 (H) 06/14/2021 1109   TRIG 39 06/14/2021 1109   HDL 48 06/14/2021 1109   CHOLHDL 4.4 06/14/2021 1109   CHOLHDL 4.7 05/05/2020 0012   VLDL 10 05/05/2020 0012   LDLCALC 157 (H) 06/14/2021 1109    CBC    Component Value Date/Time   WBC 15.4 (H) 07/30/2022 1641   WBC 10.3 05/04/2022 0133   RBC 4.22 07/30/2022 1641   RBC 3.95 05/04/2022 0133   HGB 12.9 07/30/2022 1641   HCT 39.1 07/30/2022 1641   PLT 268 07/30/2022 1641   MCV 93 07/30/2022 1641   MCH 30.6 07/30/2022 1641   MCH 31.1 05/04/2022 0133   MCHC 33.0 07/30/2022 1641   MCHC 33.9 05/04/2022 0133   RDW 12.1 07/30/2022 1641  LYMPHSABS 3.3 (H) 07/30/2022 1641   MONOABS 1.1 (H) 05/04/2022 0133   EOSABS 0.1 07/30/2022 1641   BASOSABS 0.1 07/30/2022 1641    No results found for: "HGBA1C"  Assessment & Plan:      Abnormal Uterine Bleeding: History of prolonged, heavy menstrual bleeding with passage of clots, worsened after taking Plan B. No current abdominal pain, nausea, or vomiting. -Of note it appears she has always had irregular heavy. -Order pelvic ultrasound to evaluate for possible fibroids or other uterine abnormalities. -Check CBC to assess for  anemia due to heavy menstrual bleeding.  Desire for Pregnancy: Expressed desire to conceive, with concerns about fertility due to age and history of heart disease. -If ultrasound and CBC results are normal, consider referral to a fertility specialist for further evaluation and guidance.           No orders of the defined types were placed in this encounter.   Follow-up: Return in about 1 month (around 10/27/2022) for Medical conditions with PCP, Pap smear.       Hoy Register, MD, FAAFP. [facility] [place], [place] [phone]   09/26/2022, 12:48 PM

## 2022-09-26 NOTE — Progress Notes (Signed)
Abnormal bleeding after taking a plan B

## 2022-09-26 NOTE — Patient Instructions (Signed)
Abnormal Uterine Bleeding  Abnormal uterine bleeding is unusual bleeding from the uterus. It includes bleeding after sex, or bleeding or spotting between menstrual periods. It may also include bleeding that is heavier than normal, menstrual periods that last longer than usual, or bleeding that occurs after menopause. Abnormal uterine bleeding can affect teenagers, women in their reproductive years, pregnant women, and women who have reached menopause. Common causes of abnormal uterine bleeding include: Pregnancy. Abnormal growths within the lining of the uterus (polyps). Benign tumors or growths in the uterus (fibroids). These are not cancer. Infection. Cancer. Too much or too little of some hormones in the body (hormonal imbalances). Any type of abnormal bleeding should be checked by a health care provider. Many cases are minor and simple to treat, but others may be more serious. Treatment will depend on the cause of the bleeding and how severe it is. Follow these instructions at home: Medicines Take over-the-counter and prescription medicines only as told by your health care provider. Ask your health care provider about: Taking medicines such as aspirin and ibuprofen. These medicines can thin your blood. Do not take these medicines unless your health care provider tells you to take them. Taking over-the-counter medicines, vitamins, herbs, and supplements. If you were prescribed iron pills, take them as told by your health care provider. Iron pills help to replace iron that your body loses because of this condition. Managing constipation In cases of severe bleeding, you may be asked to increase your iron intake to treat anemia. Doing this may cause constipation. To prevent or treat constipation, you may need to: Drink enough fluid to keep your urine pale yellow. Take over-the-counter or prescription medicines. Eat foods that are high in fiber, such as beans, whole grains, and fresh fruits and  vegetables. Limit foods that are high in fat and processed sugars, such as fried or sweet foods. Activity Alter your activity to decrease bleeding if you need to change your sanitary pad more than one time every 2 hours: Lie in bed with your feet raised (elevated). Place a cold pack on your lower abdomen. Rest as much as possible until the bleeding stops or slows down. General instructions Do not use tampons, douche, or have sex until your health care provider says these things are okay. Change your sanitary pads often. Get regular exams. These include pelvic exams and cervical cancer screenings. It is up to you to get the results of any tests that are done. Ask your health care provider, or the department that is doing the tests, when your results will be ready. Monitor your condition for any changes. For 2 months, write down: When your menstrual period starts. When your menstrual period ends. When any abnormal vaginal bleeding occurs. What problems you notice. Keep all follow-up visits. This is important. Contact a health care provider if: You have bleeding that lasts for more than one week. You feel dizzy at times. You feel nauseous or you vomit. You feel light-headed or weak. You notice any other changes that show that your condition is getting worse. Get help right away if: You faint. You have bleeding that soaks through a sanitary pad every hour. You have pain in the abdomen. You have a fever or chills. You become sweaty or weak. You pass large blood clots from your vagina. These symptoms may represent a serious problem that is an emergency. Do not wait to see if the symptoms will go away. Get medical help right away. Call your local emergency services (  911 in the [place] Do not drive yourself to the hospital. Summary Abnormal uterine bleeding is unusual bleeding from the uterus. Any type of abnormal bleeding should be checked by a health care provider. Many cases are minor and  simple to treat, but others may be more serious. Treatment will depend on the cause of the bleeding and how severe it is. Get help right away if you faint, you have bleeding that soaks through a sanitary pad every hour, or you pass large blood clots from your vagina. This information is not intended to replace advice given to you by your health care provider. Make sure you discuss any questions you have with your health care provider. Document Revised: 06/27/2020 Document Reviewed: 06/27/2020 Elsevier Patient Education  2024 [employer].

## 2022-09-27 ENCOUNTER — Encounter: Payer: Self-pay | Admitting: Family Medicine

## 2022-09-27 LAB — CBC WITH DIFFERENTIAL/PLATELET
Basophils Absolute: 0 10*3/uL (ref 0.0–0.2)
Basos: 0 %
EOS (ABSOLUTE): 0.1 10*3/uL (ref 0.0–0.4)
Eos: 1 %
Hematocrit: 38.5 % (ref 34.0–46.6)
Hemoglobin: 12.4 g/dL (ref 11.1–15.9)
Immature Grans (Abs): 0 10*3/uL (ref 0.0–0.1)
Immature Granulocytes: 0 %
Lymphocytes Absolute: 2.6 10*3/uL (ref 0.7–3.1)
Lymphs: 35 %
MCH: 29.7 pg (ref 26.6–33.0)
MCHC: 32.2 g/dL (ref 31.5–35.7)
MCV: 92 fL (ref 79–97)
Monocytes Absolute: 0.7 10*3/uL (ref 0.1–0.9)
Monocytes: 10 %
Neutrophils Absolute: 4 10*3/uL (ref 1.4–7.0)
Neutrophils: 54 %
Platelets: 232 10*3/uL (ref 150–450)
RBC: 4.17 x10E6/uL (ref 3.77–5.28)
RDW: 13.3 % (ref 11.7–15.4)
WBC: 7.4 10*3/uL (ref 3.4–10.8)

## 2022-09-27 LAB — HCV INTERPRETATION

## 2022-09-27 LAB — HCV AB W REFLEX TO QUANT PCR: HCV Ab: NONREACTIVE

## 2022-10-02 ENCOUNTER — Other Ambulatory Visit: Payer: No Typology Code available for payment source

## 2022-10-04 ENCOUNTER — Ambulatory Visit: Payer: Self-pay | Admitting: *Deleted

## 2022-10-04 NOTE — Telephone Encounter (Addendum)
  Chief Complaint: c/o having chest pain 5 minutes ago, chest pain is gone now but is very hot, sweating, weak and took off all her clothes.   Has not eaten all day.   Has nitroglycerin for chest pain but did not take it.   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    "This feels like I did then during my heart attack". Symptoms: above.  BP is low 94/50 and 99/59 right before calling me.   Still feeling very weak.   Frequency: 5 minutes ago the chest pain happened but still sweating, feeling very hot and weak and not feeling well. Pertinent Negatives: Patient denies having chest pain presently but still instructed to call 911 or go to the ED now. Disposition: [x] ED /[] Urgent Care (no appt availability in office) / [] Appointment(In office/virtual)/ []  [facility]/ [] Home Care/ [] Refused Recommended Disposition /[] [place] [facility]/ []  Follow-up with PCP Additional Notes: Referred to the ED.    Instructed to call 911.   Her mother is with her.    She said she would "Get someone to take me to the ED"  I instructed her to call 911 now.    Not sure if she is going to call 911 or not or get someone to take her to the ED.   She said she would go to the one on [address] [facility]).

## 2022-10-04 NOTE — Telephone Encounter (Signed)
Reason for Disposition  SEVERE chest pain    History of MI in 2022   Has  4 stents in her heart.  Answer Assessment - Initial Assessment Questions 1. LOCATION: "Where does it hurt?"       5 minutes ago I had chest pain and felt dizzy and hot.   I took off all my clothes.   My BP was 94/50.    Now it's 99/59 just now.  This has happened before.   I have 4 stents in my heart from a heart attack in 2022.    I get real weak and sit down and get my clothes off.   My body was sweating.   2. RADIATION: "Does the pain go anywhere else?" (e.g., into neck, jaw, arms, back)     No   Just in my chest.   3. ONSET: "When did the chest pain begin?" (Minutes, hours or days)      5 minutes ago.   I was preparing food and I was cooking.   I had a heart attack while I was cooking in 2022.    I just took my BP medicine a few minutes ago.   I've not eaten today either.    I'm struggling right now that's all.   I got something to eat now.    I feel alright and the pain went away.   I'm trying to cool off a little.     4. PATTERN: "Does the pain come and go, or has it been constant since it started?"  "Does it get worse with exertion?"      The chest pain is gone now but I still feel real weak and hot.   I'll get someone to take me to the ED.   My mother is here with me.   I instructed her to call 911 if there wasn't anyone to take her to the ED now.  I 5. DURATION: "How long does it last" (e.g., seconds, minutes, hours)     Just a few minutes. 6. SEVERITY: "How bad is the pain?"  (e.g., Scale 1-10; mild, moderate, or severe)    - MILD (1-3): doesn't interfere with normal activities     - MODERATE (4-7): interferes with normal activities or awakens from sleep    - SEVERE (8-10): excruciating pain, unable to do any normal activities       It's gone now but it was pretty bad for a few minutes and I was sweating real bad and really hot.   I took all my clothes off. 7. CARDIAC RISK FACTORS: "Do you have any history  of heart problems or risk factors for heart disease?" (e.g., angina, prior heart attack; diabetes, high blood pressure, high cholesterol, smoker, or strong family history of heart disease)     Yes I had a heart attack in 2022 and I have 4 stents in my heart.  I was cooking when this happened.   I have not eaten all day.    I asked why she had not eaten and she said,   "I've just been really struggling but I'm fine now" 8. PULMONARY RISK FACTORS: "Do you have any history of lung disease?"  (e.g., blood clots in lung, asthma, emphysema, birth control pills)     No 9. CAUSE: "What do you think is causing the chest pain?"     I've had a heart attack before and it felt like this. 10. OTHER SYMPTOMS: "Do you have any  other symptoms?" (e.g., dizziness, nausea, vomiting, sweating, fever, difficulty breathing, cough)       Sweating, feeling very hot took all her clothes off, short of breath while having the chest pain and very weak.   I have nitroglycerin but I didn't take it because I didn't think the pain was that bad. 11. PREGNANCY: "Is there any chance you are pregnant?" "When was your last menstrual period?"       Not asked  Protocols used: Chest Pain-A-AH

## 2022-10-04 NOTE — Telephone Encounter (Signed)
noted 

## 2022-10-07 ENCOUNTER — Other Ambulatory Visit: Payer: No Typology Code available for payment source

## 2022-10-07 ENCOUNTER — Ambulatory Visit: Payer: No Typology Code available for payment source | Admitting: Family Medicine

## 2022-10-27 ENCOUNTER — Other Ambulatory Visit: Payer: Self-pay | Admitting: Cardiology

## 2022-10-28 ENCOUNTER — Telehealth: Payer: No Typology Code available for payment source

## 2022-10-28 ENCOUNTER — Other Ambulatory Visit: Payer: Self-pay

## 2022-10-28 DIAGNOSIS — N760 Acute vaginitis: Secondary | ICD-10-CM | POA: Diagnosis not present

## 2022-10-28 DIAGNOSIS — B9689 Other specified bacterial agents as the cause of diseases classified elsewhere: Secondary | ICD-10-CM

## 2022-10-28 MED ORDER — METRONIDAZOLE 500 MG PO TABS
500.0000 mg | ORAL_TABLET | Freq: Two times a day (BID) | ORAL | 0 refills | Status: AC
Start: 2022-10-28 — End: 2022-11-04

## 2022-10-28 NOTE — Progress Notes (Signed)
E-Visit for Vaginal Symptoms  We are sorry that you are not feeling well. Here is how we plan to help! Based on what you shared with me it looks like you: May have a vaginosis due to bacteria  Vaginosis is an inflammation of the vagina that can result in discharge, itching and pain. The cause is usually a change in the normal balance of vaginal bacteria or an infection. Vaginosis can also result from reduced estrogen levels after menopause.  The most common causes of vaginosis are:   Bacterial vaginosis which results from an overgrowth of one on several organisms that are normally present in your vagina.   Yeast infections which are caused by a naturally occurring fungus called candida.   Vaginal atrophy (atrophic vaginosis) which results from the thinning of the vagina from reduced estrogen levels after menopause.   Trichomoniasis which is caused by a parasite and is commonly transmitted by sexual intercourse.  Factors that increase your risk of developing vaginosis include: Medications, such as antibiotics and steroids Uncontrolled diabetes Use of hygiene products such as bubble bath, vaginal spray or vaginal deodorant Douching Wearing damp or tight-fitting clothing Using an intrauterine device (IUD) for birth control Hormonal changes, such as those associated with pregnancy, birth control pills or menopause Sexual activity Having a sexually transmitted infection  Your treatment plan is Metronidazole or Flagyl 500mg twice a day for 7 days.  I have electronically sent this prescription into the pharmacy that you have chosen.  Be sure to take all of the medication as directed. Stop taking any medication if you develop a rash, tongue swelling or shortness of breath. Mothers who are breast feeding should consider pumping and discarding their breast milk while on these antibiotics. However, there is no consensus that infant exposure at these doses would be harmful.  Remember that  medication creams can weaken latex condoms. .   HOME CARE:  Good hygiene may prevent some types of vaginosis from recurring and may relieve some symptoms:  Avoid baths, hot tubs and whirlpool spas. Rinse soap from your outer genital area after a shower, and dry the area well to prevent irritation. Don't use scented or harsh soaps, such as those with deodorant or antibacterial action. Avoid irritants. These include scented tampons and pads. Wipe from front to back after using the toilet. Doing so avoids spreading fecal bacteria to your vagina.  Other things that may help prevent vaginosis include:  Don't douche. Your vagina doesn't require cleansing other than normal bathing. Repetitive douching disrupts the normal organisms that reside in the vagina and can actually increase your risk of vaginal infection. Douching won't clear up a vaginal infection. Use a latex condom. Both female and female latex condoms may help you avoid infections spread by sexual contact. Wear cotton underwear. Also wear pantyhose with a cotton crotch. If you feel comfortable without it, skip wearing underwear to bed. Yeast thrives in moist environments Your symptoms should improve in the next day or two.  GET HELP RIGHT AWAY IF:  You have pain in your lower abdomen ( pelvic area or over your ovaries) You develop nausea or vomiting You develop a fever Your discharge changes or worsens You have persistent pain with intercourse You develop shortness of breath, a rapid pulse, or you faint.  These symptoms could be signs of problems or infections that need to be evaluated by a medical provider now.  MAKE SURE YOU   Understand these instructions. Will watch your condition. Will get help right   away if you are not doing well or get worse.  Thank you for choosing an e-visit.  Your e-visit answers were reviewed by a board certified advanced clinical practitioner to complete your personal care plan. Depending upon the  condition, your plan could have included both over the counter or prescription medications.  Please review your pharmacy choice. Make sure the pharmacy is open so you can pick up prescription now. If there is a problem, you may contact your provider through MyChart messaging and have the prescription routed to another pharmacy.  Your safety is important to us. If you have drug allergies check your prescription carefully.   For the next 24 hours you can use MyChart to ask questions about today's visit, request a non-urgent call back, or ask for a work or school excuse. You will get an email in the next two days asking about your experience. I hope that your e-visit has been valuable and will speed your recovery.  I have spent 5 minutes in review of e-visit questionnaire, review and updating patient chart, medical decision making and response to patient.   Jennifer M Burnette, PA-C  

## 2022-10-29 ENCOUNTER — Other Ambulatory Visit (HOSPITAL_COMMUNITY): Payer: Self-pay

## 2022-10-29 MED ORDER — METOPROLOL SUCCINATE ER 25 MG PO TB24
12.5000 mg | ORAL_TABLET | Freq: Every day | ORAL | 3 refills | Status: AC
  Filled 2022-10-29 – 2022-11-07 (×2): qty 45, 90d supply, fill #0
  Filled 2023-02-01 – 2023-02-13 (×2): qty 45, 90d supply, fill #1
  Filled 2023-06-08: qty 45, 90d supply, fill #2

## 2022-10-29 MED ORDER — ATORVASTATIN CALCIUM 80 MG PO TABS
80.0000 mg | ORAL_TABLET | Freq: Every day | ORAL | 3 refills | Status: AC
  Filled 2022-10-29 – 2022-11-07 (×2): qty 90, 90d supply, fill #0
  Filled 2023-02-01 – 2023-02-13 (×2): qty 90, 90d supply, fill #1
  Filled 2023-06-08: qty 90, 90d supply, fill #2

## 2022-10-29 MED ORDER — EZETIMIBE 10 MG PO TABS
10.0000 mg | ORAL_TABLET | Freq: Every day | ORAL | 3 refills | Status: AC
  Filled 2022-10-29 – 2022-11-07 (×2): qty 90, 90d supply, fill #0
  Filled 2023-02-01 – 2023-02-13 (×2): qty 90, 90d supply, fill #1

## 2022-10-30 ENCOUNTER — Other Ambulatory Visit (HOSPITAL_COMMUNITY): Payer: Self-pay

## 2022-11-06 ENCOUNTER — Other Ambulatory Visit (HOSPITAL_COMMUNITY): Payer: Self-pay

## 2022-11-06 ENCOUNTER — Encounter (HOSPITAL_COMMUNITY): Payer: Self-pay

## 2022-11-07 ENCOUNTER — Other Ambulatory Visit (HOSPITAL_COMMUNITY): Payer: Self-pay

## 2022-11-07 ENCOUNTER — Other Ambulatory Visit: Payer: Self-pay

## 2022-11-07 ENCOUNTER — Ambulatory Visit: Payer: No Typology Code available for payment source | Attending: Cardiology | Admitting: Cardiology

## 2022-11-07 ENCOUNTER — Telehealth: Payer: Self-pay | Admitting: Cardiology

## 2022-11-07 ENCOUNTER — Encounter: Payer: Self-pay | Admitting: Cardiology

## 2022-11-07 ENCOUNTER — Other Ambulatory Visit: Payer: Self-pay | Admitting: Cardiology

## 2022-11-07 ENCOUNTER — Telehealth: Payer: Self-pay | Admitting: Pharmacy Technician

## 2022-11-07 VITALS — BP 114/78 | HR 97 | Ht 68.0 in | Wt 154.0 lb

## 2022-11-07 DIAGNOSIS — R002 Palpitations: Secondary | ICD-10-CM | POA: Diagnosis not present

## 2022-11-07 DIAGNOSIS — I2510 Atherosclerotic heart disease of native coronary artery without angina pectoris: Secondary | ICD-10-CM | POA: Diagnosis not present

## 2022-11-07 DIAGNOSIS — E785 Hyperlipidemia, unspecified: Secondary | ICD-10-CM

## 2022-11-07 DIAGNOSIS — Z720 Tobacco use: Secondary | ICD-10-CM

## 2022-11-07 MED ORDER — TICAGRELOR 90 MG PO TABS: 90.0000 mg | ORAL_TABLET | Freq: Two times a day (BID) | ORAL | 3 refills | Status: AC

## 2022-11-07 NOTE — Telephone Encounter (Signed)
Pt's medication was sent to pt's pharmacy as requested. Confirmation received.  °

## 2022-11-07 NOTE — Telephone Encounter (Signed)
*  STAT* If patient is at the pharmacy, call can be transferred to refill team.   1. Which medications need to be refilled? (please list name of each medication and dose if known)   ticagrelor (BRILINTA) 90 MG TABS tablet    2. Which pharmacy/location (including street and city if local pharmacy) is medication to be sent to?  [id number] - [place], [place] - [address]      3. Do they need a 30 day or 90 day supply? 90 day

## 2022-11-07 NOTE — Telephone Encounter (Signed)
Pharmacy Patient Advocate Encounter   Received notification from CoverMyMeds that prior authorization for Repatha SureClick 140MG /ML auto-injectors is required/requested.   Insurance verification completed.   The patient is insured through [facility] .   Per test claim: PA required; PA submitted to [facility] via CoverMyMeds Key/confirmation #/EOC [id number] Status is pending

## 2022-11-07 NOTE — Patient Instructions (Signed)
Medication Instructions:  No changes *If you need a refill on your cardiac medications before your next appointment, please call your pharmacy*   Lab Work: Lipid panel- today If you have labs (blood work) drawn today and your tests are completely normal, you will receive your results only by: MyChart Message (if you have MyChart) OR A paper copy in the mail If you have any lab test that is abnormal or we need to change your treatment, we will call you to review the results.  Follow-Up: At [facility], you and your health needs are our priority.  As part of our continuing mission to provide you with exceptional heart care, we have created designated Provider Care Teams.  These Care Teams include your primary Cardiologist (physician) and Advanced Practice Providers (APPs -  Physician Assistants and Nurse Practitioners) who all work together to provide you with the care you need, when you need it.  We recommend signing up for the patient portal called "MyChart".  Sign up information is provided on this After Visit Summary.  MyChart is used to connect with patients for Virtual Visits (Telemedicine).  Patients are able to view lab/test results, encounter notes, upcoming appointments, etc.  Non-urgent messages can be sent to your provider as well.   To learn more about what you can do with MyChart, go to [email or web address].    Your next appointment:   6 month(s)  Provider:   Dr Bjorn Pippin

## 2022-11-07 NOTE — Progress Notes (Signed)
Cardiology Office Note:    Date:  11/07/2022   ID:  Susan Sexton, DOB May 31, 1985, MRN [id number]  PCP:  Hoy Register, MD  Cardiologist:  Little Ishikawa, MD  Electrophysiologist:  None   Referring MD: Hoy Register, MD   Chief Complaint  Patient presents with   Coronary Artery Disease    History of Present Illness:    Susan Sexton is a 37 y.o. female with a hx of CAD, hyperlipidemia, tobacco use who presents for follow-up.  She was admitted to [facility] on 05/04/2020 with NSTEMI.  She had presented to [facility] ED with chest pain.  Found to have mildly elevated troponins (peak 156).  Despite her age, she was noted to have significant CAD risk factors as suspected FH given LDL to 108 and family history including father having CABG in his 26s.  Echocardiogram was done on 05/05/2020 which showed normal biventricular function, no significant valvular disease.  LHC on 05/05/2020 showed 99% proximal RCA stenosis, 50% proximal to mid RCA, 20% distal RCA, 40% proximal to mid LCx, 80% proximal to mid LAD.  RCA disease was treated with DES.  Staged PCI to the LAD was done on 05/23/2020.  Underwent DES x2 to proximal/mid LAD.  This was complicated by likely guide catheter dissection of the left main, which was treated with DES.  Lexiscan Myoview 07/2022 showed normal perfusion (with likely breast attenuation artifact), EF 57%.  Zio patch x 13 days 07/2022 showed no significant arrhythmias.  Since last clinic visit, she reports she is doing okay.  Denies any recent chest pain or dyspnea.  Reports some lightheadedness but denies any syncope.  Denies any lower extremity edema or palpitations.  She had quit smoking but is now back to smoking 1 cigarette/day.  Past Medical History:  Diagnosis Date   Asthma    Coronary artery disease    High cholesterol    Hyperlipidemia LDL goal <100 05/04/2020   NSTEMI (non-ST elevated myocardial infarction) [facility])     Past Surgical History:  Procedure  Laterality Date   BREAST LUMPECTOMY Left 06/6/219   CARDIAC CATHETERIZATION     CORONARY STENT INTERVENTION N/A 05/05/2020   Procedure: CORONARY STENT INTERVENTION;  Surgeon: Iran Ouch, MD;  Location: [facility] INVASIVE CV LAB;  Service: Cardiovascular;  Laterality: N/A;   CORONARY STENT INTERVENTION N/A 05/23/2020   Procedure: CORONARY STENT INTERVENTION;  Surgeon: Corky Crafts, MD;  Location: [facility] INVASIVE CV LAB;  Service: Cardiovascular;  Laterality: N/A;   CORONARY STENT PLACEMENT  05/05/2020   CORONARY ULTRASOUND/IVUS N/A 05/23/2020   Procedure: Intravascular Ultrasound/IVUS;  Surgeon: Corky Crafts, MD;  Location: [facility] INVASIVE CV LAB;  Service: Cardiovascular;  Laterality: N/A;   INCISION AND DRAINAGE BREAST ABSCESS Left 09/2017   LEFT HEART CATH AND CORONARY ANGIOGRAPHY N/A 05/05/2020   Procedure: LEFT HEART CATH AND CORONARY ANGIOGRAPHY;  Surgeon: Iran Ouch, MD;  Location: [facility] INVASIVE CV LAB;  Service: Cardiovascular;  Laterality: N/A;   LEFT HEART CATH AND CORONARY ANGIOGRAPHY N/A 05/23/2020   Procedure: LEFT HEART CATH AND CORONARY ANGIOGRAPHY;  Surgeon: Corky Crafts, MD;  Location: [facility] INVASIVE CV LAB;  Service: Cardiovascular;  Laterality: N/A;    Current Medications: Current Meds  Medication Sig   acetaminophen (TYLENOL) 500 MG tablet Take 500-1,000 mg by mouth every 6 (six) hours as needed (for pain.).   albuterol (VENTOLIN HFA) 108 (90 Base) MCG/ACT inhaler Inhale 2 puffs into the lungs every 6 (six) hours as needed  for wheezing or shortness of breath. NEEDS PASS   aspirin 81 MG chewable tablet Chew 1 tablet (81 mg total) by mouth daily.   atorvastatin (LIPITOR) 80 MG tablet Take 1 tablet (80 mg total) by mouth daily.   Evolocumab (REPATHA SURECLICK) 140 MG/ML SOAJ Inject 140 mg into the skin every 14 (fourteen) days.   ezetimibe (ZETIA) 10 MG tablet Take 1 tablet (10 mg total) by mouth daily.   metoprolol succinate (TOPROL-XL) 25 MG 24 hr tablet  Take 0.5 tablets (12.5 mg total) by mouth daily.   Multiple Vitamins-Minerals (MULTI ADULT GUMMIES PO) Take 1 tablet by mouth daily.   nitrofurantoin, macrocrystal-monohydrate, (MACROBID) 100 MG capsule Take 1 capsule (100 mg total) by mouth 2 (two) times daily.   nitroGLYCERIN (NITROSTAT) 0.4 MG SL tablet Place 1 tablet (0.4 mg total) under the tongue every 5 (five) minutes x 3 doses as needed for chest pain.   ticagrelor (BRILINTA) 90 MG TABS tablet Take 1 tablet (90 mg total) by mouth 2 (two) times daily.     Allergies:   Patient has no known allergies.   Social History   Socioeconomic History   Marital status: Single    Spouse name: Not on file   Number of children: Not on file   Years of education: 11   Highest education level: 11th grade  Occupational History   Not on file  Tobacco Use   Smoking status: Former    Current packs/day: 0.00    Types: Cigarettes    Quit date: 05/06/2020    Years since quitting: 2.5   Smokeless tobacco: Never   Tobacco comments:    Laini was given 1-800-quit-now. Anitta started back smoking 06/07/22.  Vaping Use   Vaping status: Former   Quit date: 05/06/2020  Substance and Sexual Activity   Alcohol use: No   Drug use: No   Sexual activity: Yes    Birth control/protection: None  Other Topics Concern   Not on file  Social History Narrative   Not on file   Social Determinants of Health   Financial Resource Strain: Medium Risk (02/26/2022)   Overall Financial Resource Strain (CARDIA)    Difficulty of Paying Living Expenses: Somewhat hard  Food Insecurity: Food Insecurity Present (02/26/2022)   Hunger Vital Sign    Worried About Running Out of Food in the Last Year: Sometimes true    Ran Out of Food in the Last Year: Sometimes true  Transportation Needs: No Transportation Needs (02/26/2022)   PRAPARE - Administrator, Civil Service (Medical): No    Lack of Transportation (Non-Medical): No  Physical Activity: Not on file   Stress: Stress Concern Present (10/19/2021)   [employer] of Occupational Health - Occupational Stress Questionnaire    Feeling of Stress : Rather much  Social Connections: Unknown (07/08/2022)   [facility] from [facility], [facility]   Social Network    Social Network: Not on file     Family History: The patient's family history includes Heart attack (age of onset: 60) in her father; Hypertension in her mother.  ROS:   Please see the history of present illness.   All other systems reviewed and are negative.  EKGs/Labs/Other Studies Reviewed:    The following studies were reviewed today:   EKG:   05/17/2020: normal sinus rhythm, rate 91, no ST/T abnormalities 07/10/2020: normal sinus rhythm, rate 66 bpm, no ST/T abnormalities   Recent Labs: 05/04/2022: ALT 25 07/30/2022: BUN 11; Creatinine, Ser 0.75;  Potassium 3.6; Sodium 138; TSH 1.490 09/26/2022: Hemoglobin 12.4; Platelets 232  Recent Lipid Panel    Component Value Date/Time   CHOL 212 (H) 06/14/2021 1109   TRIG 39 06/14/2021 1109   HDL 48 06/14/2021 1109   CHOLHDL 4.4 06/14/2021 1109   CHOLHDL 4.7 05/05/2020 0012   VLDL 10 05/05/2020 0012   LDLCALC 157 (H) 06/14/2021 1109    Physical Exam:    VS:  BP 114/78 (BP Location: Left Arm, Patient Position: Sitting, Cuff Size: Normal)   Pulse 97   Ht 5\' 8"  (1.727 m)   Wt 154 lb (69.9 kg)   SpO2 97%   BMI 23.42 kg/m     Wt Readings from Last 3 Encounters:  11/07/22 154 lb (69.9 kg)  09/26/22 161 lb (73 kg)  08/01/22 130 lb (59 kg)     GEN:  Well nourished, well developed in no acute distress HEENT: Normal NECK: No JVD; No carotid bruits CARDIAC: RRR, no murmurs, rubs, gallops RESPIRATORY:  Clear to auscultation without rales, wheezing or rhonchi  ABDOMEN: Soft, non-tender, non-distended MUSCULOSKELETAL:  No edema; No deformity  SKIN: Warm and dry NEUROLOGIC:  Alert and oriented x 3 PSYCHIATRIC:  Normal affect   ASSESSMENT:    1. Coronary artery  disease involving native coronary artery of native heart without angina pectoris   2. Hyperlipidemia, unspecified hyperlipidemia type   3. Palpitations   4. Tobacco use       PLAN:    CAD: Presented with NSTEMI 05/04/2020.  Echocardiogram was done on 05/05/2020 which showed normal biventricular function, no significant valvular disease.  LHC on 05/05/2020 showed 99% proximal RCA stenosis, 50% proximal to mid RCA, 20% distal RCA, 40% proximal to mid LCx, 80% proximal to mid LAD.  RCA disease was treated with DES.  Staged PCI to the LAD was done on 05/23/2020.  Underwent DES x2 to proximal/mid LAD.  This was complicated by likely guide catheter dissection of the left main, which was treated with DES.  Reported chest pain, underwent Lexiscan Myoview 07/2022 showed normal perfusion (with likely breast attenuation artifact), EF 57%.   -Continue aspirin 81 mg daily, ticagrelor 90 mg twice daily.  Given extensive stenting, will continue DAPT  -Continue atorvastatin/Zetia, referred to lipid clinic for PCSK9 inhibitor -Continue Toprol-XL 12.5 mg daily  Hyperlipidemia: Suspect FH given LDL 282 in 01/2020 and family history including father had CABG in 30s. Currently on Lipitor 80mg  daily, Zetia 10mg  daily, and Repatha. Check lipid panel  Tobacco use: Smoked 1 pack/day x 15 years.  She had quit smoking but now back to smoking 1 cigarette per day.  Encouraged cessation  Palpitations: Zio patch x 13 days 07/2022 showed no significant arrhythmias.  Carotid bruit: no carotid stenosis on duplex 07/2022  RTC in 6 months  Medication Adjustments/Labs and Tests Ordered: Current medicines are reviewed at length with the patient today.  Concerns regarding medicines are outlined above.  Orders Placed This Encounter  Procedures   Lipid panel    No orders of the defined types were placed in this encounter.    Patient Instructions  Medication Instructions:  No changes *If you need a refill on your cardiac  medications before your next appointment, please call your pharmacy*   Lab Work: Lipid panel- today If you have labs (blood work) drawn today and your tests are completely normal, you will receive your results only by: MyChart Message (if you have MyChart) OR A paper copy in the mail If you have any  lab test that is abnormal or we need to change your treatment, we will call you to review the results.  Follow-Up: At [facility], you and your health needs are our priority.  As part of our continuing mission to provide you with exceptional heart care, we have created designated Provider Care Teams.  These Care Teams include your primary Cardiologist (physician) and Advanced Practice Providers (APPs -  Physician Assistants and Nurse Practitioners) who all work together to provide you with the care you need, when you need it.  We recommend signing up for the patient portal called "MyChart".  Sign up information is provided on this After Visit Summary.  MyChart is used to connect with patients for Virtual Visits (Telemedicine).  Patients are able to view lab/test results, encounter notes, upcoming appointments, etc.  Non-urgent messages can be sent to your provider as well.   To learn more about what you can do with MyChart, go to [email or web address].    Your next appointment:   6 month(s)  Provider:   Dr Bjorn Pippin       Signed, Little Ishikawa, MD  11/07/2022 3:37 PM    [facility]

## 2022-11-08 ENCOUNTER — Other Ambulatory Visit: Payer: Self-pay

## 2022-11-08 ENCOUNTER — Other Ambulatory Visit (HOSPITAL_COMMUNITY): Payer: Self-pay

## 2022-11-08 LAB — LIPID PANEL
Chol/HDL Ratio: 5.1 ratio — ABNORMAL HIGH (ref 0.0–4.4)
Cholesterol, Total: 251 mg/dL — ABNORMAL HIGH (ref 100–199)
HDL: 49 mg/dL (ref >39)
LDL Chol Calc (NIH): 194 mg/dL — ABNORMAL HIGH (ref 0–99)
Triglycerides: 52 mg/dL (ref 0–149)
VLDL Cholesterol Cal: 8 mg/dL (ref 5–40)

## 2022-11-12 ENCOUNTER — Other Ambulatory Visit: Payer: Self-pay

## 2022-11-12 DIAGNOSIS — I2510 Atherosclerotic heart disease of native coronary artery without angina pectoris: Secondary | ICD-10-CM

## 2022-11-12 DIAGNOSIS — E785 Hyperlipidemia, unspecified: Secondary | ICD-10-CM

## 2022-11-27 ENCOUNTER — Ambulatory Visit: Payer: No Typology Code available for payment source | Attending: Cardiovascular Disease

## 2022-12-03 ENCOUNTER — Encounter: Payer: Self-pay | Admitting: Cardiology

## 2022-12-18 ENCOUNTER — Ambulatory Visit: Payer: Self-pay

## 2022-12-18 ENCOUNTER — Ambulatory Visit: Payer: Medicaid Other | Admitting: Physician Assistant

## 2022-12-18 NOTE — Telephone Encounter (Signed)
noted 

## 2022-12-18 NOTE — Telephone Encounter (Signed)
Message from Godley T sent at 12/18/2022 12:37 PM EDT  Summary: flu symptom   Patient was accidentally scheduled for the wrong clinic with body aches, and other flu like symptoms. There are no office, same day nor virtual appts available with [facility]. Patient needs to be scheduled/seen at an urgent care today. Please f/u with patient         Chief Complaint: cough Symptoms: SOB with exertion, brown phlegm  Frequency: Thursday  Pertinent Negatives: Patient denies chest pain  Disposition: [] ED /[x] Urgent Care (no appt availability in office) / [] Appointment(In office/virtual)/ []  [place] [facility]/ [] Home Care/ [] Refused Recommended Disposition /[] [place] [facility]/ []  Follow-up with PCP Additional Notes: no appt in office Made appt for pt to go to [facility] Reason for Disposition  [1] MILD difficulty breathing (e.g., minimal/no SOB at rest, SOB with walking, pulse <100) AND [2] NEW-onset or WORSE than normal  Answer Assessment - Initial Assessment Questions 1. RESPIRATORY STATUS: "Describe your breathing?" (e.g., wheezing, shortness of breath, unable to speak, severe coughing)      SOB - severe coughing thick and brown  2. ONSET: "When did this breathing problem begin?"      Thursday  4. SEVERITY: "How bad is your breathing?" (e.g., mild, moderate, severe)    - MILD: No SOB at rest, mild SOB with walking, speaks normally in sentences, can lie down, no retractions, pulse < 100.    - MODERATE: SOB at rest, SOB with minimal exertion and prefers to sit, cannot lie down flat, speaks in phrases, mild retractions, audible wheezing, pulse 100-120.    - SEVERE: Very SOB at rest, speaks in single words, struggling to breathe, sitting hunched forward, retractions, pulse > 120      Mild wheezing  7. LUNG HISTORY: "Do you have any history of lung disease?"  (e.g., pulmonary embolus, asthma, emphysema)     Asthma 8. CAUSE: "What do you think is causing the breathing  problem?"      From being sick and cough not going away 9. OTHER SYMPTOMS: "Do you have any other symptoms? (e.g., dizziness, runny nose, cough, chest pain, fever)     Nasal congestion  Protocols used: Breathing Difficulty-A-AH

## 2022-12-18 NOTE — Telephone Encounter (Signed)
Summary: flu symptom   Patient was accidentally scheduled for the wrong clinic with body aches, and other flu like symptoms. There are no office, same day nor virtual appts available with [facility]. Patient needs to be scheduled/seen at an urgent care today. Please f/u with patient          Voice mailbox has not been set up, unable to leave message.

## 2023-01-14 ENCOUNTER — Telehealth: Payer: Self-pay

## 2023-01-14 NOTE — Telephone Encounter (Signed)
Attempted to contact patient and no VM is set up to leave a message.  Pt will need to have virtual visit with provider once paperwork is received.

## 2023-01-14 NOTE — Telephone Encounter (Signed)
Copied from CRM [id number]. 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 Her doctor thinks that's a lot. She gets short of breath with going up and down steps constantly so she uses the app on her phone to track her heart rate but they are not allowed to have their phone on the floor. These are things she is asking Dr Alvis Lemmings to put on her paperwork that will be faxed over today and due back today

## 2023-01-15 NOTE — Telephone Encounter (Signed)
Patient called because she thought she had a telephone visit with Dr. Alvis Lemmings this morning, but it was supposed to be a MyChart visit and the appointment time was missed. Can patient be worked into another virtual visit or in person visit to have paperwork completed. Please advise.

## 2023-01-15 NOTE — Telephone Encounter (Signed)
Vm was left informing patient that appointment was not today, appointment is scheduled for tomorrow.

## 2023-01-16 ENCOUNTER — Telehealth: Payer: No Typology Code available for payment source | Admitting: Family Medicine

## 2023-01-16 ENCOUNTER — Encounter: Payer: Self-pay | Admitting: Family Medicine

## 2023-01-21 ENCOUNTER — Telehealth: Payer: Self-pay | Admitting: Family Medicine

## 2023-01-21 NOTE — Telephone Encounter (Signed)
Copied from CRM [id number]. Topic: Appointment Scheduling - Scheduling Inquiry for Clinic >> Jan 15, 2023  9:44 AM Phill Myron wrote: Work 12 hr shift  (not allowed to have phones)  Lunch is 9:20am to 9:55am  is it possible to change video visit to that time. Or I Get off at 430pm >> Jan 16, 2023  9:35 AM Phill Myron wrote: Please call regarding her paper work.   Did you receive it yet?

## 2023-01-22 ENCOUNTER — Telehealth: Payer: Self-pay

## 2023-01-22 NOTE — Telephone Encounter (Signed)
Is there anywhere that we can add this patient to the schedule to fit around her lunch(9:20-9:55) for a virtual visit to complete her paperwork. Pt states that she is off work at [employer].

## 2023-01-22 NOTE — Telephone Encounter (Signed)
Copied from CRM [id number]. Topic: Appointment Scheduling - Scheduling Inquiry for Clinic >> Jan 15, 2023  9:44 AM Phill Myron wrote: Work 12 hr shift  (not allowed to have phones)  Lunch is 9:20am to 9:55am  is it possible to change video visit to that time. Or I Get off at 430pm >> Jan 22, 2023 12:56 PM Payton Doughty wrote: Pt needs virtual to complete paperwork w/ Dr Alvis Lemmings

## 2023-01-23 ENCOUNTER — Ambulatory Visit: Payer: No Typology Code available for payment source | Attending: Family Medicine | Admitting: Family Medicine

## 2023-01-23 ENCOUNTER — Encounter: Payer: Self-pay | Admitting: Family Medicine

## 2023-01-23 DIAGNOSIS — I214 Non-ST elevation (NSTEMI) myocardial infarction: Secondary | ICD-10-CM

## 2023-01-23 DIAGNOSIS — Z029 Encounter for administrative examinations, unspecified: Secondary | ICD-10-CM

## 2023-01-23 DIAGNOSIS — J4520 Mild intermittent asthma, uncomplicated: Secondary | ICD-10-CM | POA: Diagnosis not present

## 2023-01-23 DIAGNOSIS — J45909 Unspecified asthma, uncomplicated: Secondary | ICD-10-CM | POA: Insufficient documentation

## 2023-01-23 NOTE — Telephone Encounter (Signed)
I have two patients at 9:10 so that time frame will not work except we have a no show. If she is able to come up with another time this morning I can work her in. Thanks

## 2023-01-23 NOTE — Progress Notes (Signed)
Virtual Visit via Video Note  I connected with Susan Sexton, on 01/23/2023 at 10:04 AM by video enabled telemedicine device and verified that I am speaking with the correct person using two identifiers.   Consent: I discussed the limitations, risks, security and privacy concerns of performing an evaluation and management service by telemedicine and the availability of in person appointments. I also discussed with the patient that there may be a patient responsible charge related to this service. The patient expressed understanding and agreed to proceed.   Location of Patient: Work  Government social research officer of Provider: Clinic   Persons participating in Telemedicine visit: Susan Sexton Dr. Alvis Lemmings     History of Present Illness: Susan Sexton is a 37 y.o. year old female  with a history of CAD status post DES, nicotine dependence. Discussed the use of AI scribe software for clinical note transcription with the patient, who gave verbal consent to proceed.     The patient, an employee at [employer], is seeking workplace accommodations due to health concerns. She reports difficulty with heavy lifting, stating that lifting anything over 50 pounds is too strenuous. 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  In addition to the physical strain, the patient also expresses concern about exposure to chemicals at work. She has asthma and has previously experienced adverse reactions when placed in the chemical room. She has informed her employer about her asthma and has since been exempted from working in that area.  The patient also mentions a history of heart disease and has been monitoring her heart rate and blood pressure using a mobile app. She has noticed some irregularities, such as a rapid heartbeat, particularly after strenuous activity.        Past Medical History:  Diagnosis Date   Asthma    Coronary artery disease    High cholesterol    Hyperlipidemia LDL goal <100 05/04/2020   NSTEMI (non-ST elevated myocardial infarction) (HCC)    No Known Allergies  Current Outpatient Medications on File Prior to Visit  Medication Sig Dispense Refill   acetaminophen (TYLENOL) 500 MG tablet Take 500-1,000 mg by mouth every 6 (six) hours as needed (for pain.).     albuterol (VENTOLIN HFA) 108 (90 Base) MCG/ACT inhaler Inhale 2 puffs into the lungs every 6 (six) hours as needed for wheezing or shortness of breath. NEEDS PASS 18 g 1   aspirin 81 MG chewable tablet Chew 1 tablet (81 mg total) by mouth daily.     atorvastatin (LIPITOR) 80 MG tablet Take 1 tablet (80 mg total) by mouth daily. 90 tablet 3   Evolocumab (REPATHA SURECLICK) 140 MG/ML SOAJ Inject 140 mg into the skin every 14 (fourteen) days. 6 mL 3   ezetimibe (ZETIA) 10 MG tablet Take 1 tablet (10 mg total) by mouth daily. 90 tablet 3   metoprolol succinate (TOPROL-XL) 25 MG 24 hr tablet Take 0.5 tablets (12.5 mg total) by mouth daily. 45 tablet 3   Multiple Vitamins-Minerals (MULTI ADULT GUMMIES PO) Take 1 tablet by mouth daily.     nitrofurantoin, macrocrystal-monohydrate, (MACROBID) 100 MG capsule Take 1 capsule (100 mg total) by mouth 2 (two) times daily. 10 capsule 0   nitroGLYCERIN (NITROSTAT) 0.4 MG SL tablet Place 1 tablet (0.4 mg total) under the tongue every 5 (five) minutes x 3 doses as  needed for chest pain. 25 tablet 2   ticagrelor (BRILINTA) 90 MG TABS tablet Take 1 tablet (90 mg total) by mouth 2 (two) times daily. 180 tablet 3   No current facility-administered medications on file prior to visit.    ROS: See HPI  Observations/Objective: Awake, alert, oriented x3 Not in acute distress Normal mood      Latest Ref Rng & Units 07/30/2022    4:41 PM 05/04/2022    1:33 AM 07/10/2021    9:00 AM  CMP  Glucose 70 - 99 mg/dL 80  782  956   BUN 6 - 20 mg/dL 11   18  10    Creatinine 0.57 - 1.00 mg/dL 2.13  0.86  5.78   Sodium 134 - 144 mmol/L 138  135  137   Potassium 3.5 - 5.2 mmol/L 3.6  3.7  3.7   Chloride 96 - 106 mmol/L 98  104  105   CO2 20 - 29 mmol/L 24  24  25    Calcium 8.7 - 10.2 mg/dL 9.7  9.1  9.4   Total Protein 6.5 - 8.1 g/dL  7.7    Total Bilirubin 0.3 - 1.2 mg/dL  0.6    Alkaline Phos 38 - 126 U/L  85    AST 15 - 41 U/L  64    ALT 0 - 44 U/L  25      Lipid Panel     Component Value Date/Time   CHOL 251 (H) 11/07/2022 1527   TRIG 52 11/07/2022 1527   HDL 49 11/07/2022 1527   CHOLHDL 5.1 (H) 11/07/2022 1527   CHOLHDL 4.7 05/05/2020 0012   VLDL 10 05/05/2020 0012   LDLCALC 194 (H) 11/07/2022 1527   LABVLDL 8 11/07/2022 1527    No results found for: "HGBA1C"   Assessment and Plan:     Administrative encounter Difficulty with heavy lifting (over 50 pounds) and exposure to chemicals due to underlying asthma. Experiences shortness of breath with exertion.   -Recommendation for no lifting over 50 pounds at work.   -Advise against working in the chemical room due to asthma.   -Workplace accommodations to be effective from 01/23/2023 to 01/22/2024.    Asthma   Patient reports history of asthma, not previously documented in chart. -Uses albuterol MDI as needed -Add asthma to problem list.    NSTEMI Patient reports heart fluttering and increased heart rate with exertion.   -Recommend patient to discuss these symptoms with her cardiologist.        Follow Up Instructions: Keep previously scheduled appointment   I discussed the assessment and treatment plan with the patient. The patient was provided an opportunity to ask questions and all were answered. The patient agreed with the plan and demonstrated an understanding of the instructions.   The patient was advised to call back or seek an in-person evaluation if the symptoms worsen or if the condition fails to improve as anticipated.     I provided 11 minutes  total of Telehealth time during this encounter including median intraservice time, reviewing previous notes, investigations, ordering medications, medical decision making, coordinating care and patient verbalized understanding at the end of the visit.     Hoy Register, MD, FAAFP. [facility] [place], [place] [phone]   01/23/2023, 10:04 AM

## 2023-01-23 NOTE — Telephone Encounter (Signed)
Pt has been worked in for virtual visit

## 2023-01-30 ENCOUNTER — Ambulatory Visit: Payer: No Typology Code available for payment source | Admitting: Physician Assistant

## 2023-01-30 ENCOUNTER — Ambulatory Visit: Payer: Self-pay

## 2023-01-30 ENCOUNTER — Encounter (HOSPITAL_COMMUNITY): Payer: Self-pay

## 2023-01-30 ENCOUNTER — Ambulatory Visit (HOSPITAL_COMMUNITY)
Admission: RE | Admit: 2023-01-30 | Discharge: 2023-01-30 | Disposition: A | Discharge status: Home / Self Care | Payer: Medicaid Other | Source: Ambulatory Visit | Attending: Nurse Practitioner | Admitting: Nurse Practitioner

## 2023-01-30 VITALS — BP 117/76 | HR 68 | Temp 97.9°F | Resp 16 | Ht 68.0 in | Wt 141.8 lb

## 2023-01-30 DIAGNOSIS — Z113 Encounter for screening for infections with a predominantly sexual mode of transmission: Secondary | ICD-10-CM

## 2023-01-30 DIAGNOSIS — N3001 Acute cystitis with hematuria: Secondary | ICD-10-CM | POA: Diagnosis present

## 2023-01-30 LAB — POCT URINALYSIS DIP (MANUAL ENTRY)
Bilirubin, UA: NEGATIVE
Glucose, UA: NEGATIVE mg/dL
Ketones, POC UA: NEGATIVE mg/dL
Nitrite, UA: POSITIVE — AB
Protein Ur, POC: 100 mg/dL — AB
Spec Grav, UA: >=1.03 — AB (ref 1.010–1.025)
Urobilinogen, UA: 0.2 U/dL
pH, UA: 6 (ref 5.0–8.0)

## 2023-01-30 LAB — POCT URINE PREGNANCY: Preg Test, Ur: NEGATIVE

## 2023-01-30 MED ORDER — NITROFURANTOIN MONOHYD MACRO 100 MG PO CAPS: 100.0000 mg | ORAL_CAPSULE | Freq: Two times a day (BID) | ORAL | 0 refills | Status: AC

## 2023-01-30 NOTE — Telephone Encounter (Signed)
Chief Complaint: Urinary symptoms  Symptoms: burning, increased frequency, abdominal discomfort  Frequency: comes and goes Pertinent Negatives: Patient denies fever, vaginal discharge, flank pain, blood in urine Disposition: [] ED /[x] Urgent Care (no appt availability in office) / [] Appointment(In office/virtual)/ []  [facility]/ [] Home Care/ [] Refused Recommended Disposition /[] [facility]/ []  Follow-up with PCP Additional Notes: Patient states she has been having pain or burning with urination for about 2-3 weeks now. Patient reports increase frequency of urination as well. Care advice given and patient was offered an appointment in office tomorrow but patient can not come in at the time available due to work schedule. Patient has been scheduled today at urgent care. Patient also reported chronic arm pain and has been scheduled for an appointment 02/20/23 for evaluation.   Reason for Disposition  [1] Painful urination AND [2] EITHER frequency or urgency AND [3] has on-call doctor  Answer Assessment - Initial Assessment Questions 1. SEVERITY: "How bad is the pain?"  (e.g., Scale 1-10; mild, moderate, or severe)   - MILD (1-3): complains slightly about urination hurting   - MODERATE (4-7): interferes with normal activities     - SEVERE (8-10): excruciating, unwilling or unable to urinate because of the pain      7/10 2. FREQUENCY: "How many times have you had painful urination today?"      Like every hour  3. PATTERN: "Is pain present every time you urinate or just sometimes?"      sometimes 4. ONSET: "When did the painful urination start?"      2-3 weeks  5. FEVER: "Do you have a fever?" If Yes, ask: "What is your temperature, how was it measured, and when did it start?"     No  6. PAST UTI: "Have you had a urine infection before?" If Yes, ask: "When was the last time?" and "What happened that time?"      Yes, I can't remember  7. CAUSE: "What do you think is causing  the painful urination?"  (e.g., UTI, scratch, Herpes sore)     UTI 8. OTHER SYMPTOMS: "Do you have any other symptoms?" (e.g., blood in urine, flank pain, genital sores, urgency, vaginal discharge)     Abdominal pain, increase urine output  Protocols used: Urination Pain - Female-A-AH

## 2023-01-30 NOTE — ED Provider Notes (Signed)
[facility]    CSN: [id number] Arrival date & time: 01/30/23  1621    HISTORY   Chief Complaint  Patient presents with   Urinary Frequency   Appointment   HPI Susan Sexton is a pleasant, 37 y.o. female who presents to urgent care today. Patient endorses vaginal itching, vaginal irritation, burning during urination, and increased frequency of urination.   Patient denies abnormal vaginal discharge, abnormal vaginal odor, genital lesion, rash, incontinence of urine, flank pain, fever , body aches, chills, rigors, malaise, and significant fatigue.   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/23/2020   Unstable angina (HCC) 05/23/2020   CAD (coronary artery disease) 05/06/2020   Tobacco abuse 05/04/2020   NSTEMI (non-ST elevated myocardial infarction) (HCC) 05/04/2020   Past Surgical History:  Procedure Laterality Date   BREAST LUMPECTOMY Left 06/6/219   CARDIAC CATHETERIZATION     CORONARY STENT INTERVENTION N/A 05/05/2020   Procedure: CORONARY STENT INTERVENTION;  Surgeon: Iran Ouch, MD;  Location: [facility];  Service: Cardiovascular;  Laterality: N/A;   CORONARY STENT INTERVENTION N/A 05/23/2020   Procedure: CORONARY STENT INTERVENTION;  Surgeon: Corky Crafts, MD;  Location: [facility] INVASIVE CV LAB;  Service: Cardiovascular;  Laterality: N/A;   CORONARY STENT PLACEMENT  05/05/2020   CORONARY ULTRASOUND/IVUS N/A 05/23/2020   Procedure: Intravascular Ultrasound/IVUS;  Surgeon: Corky Crafts, MD;  Location: [facility] INVASIVE CV LAB;  Service: Cardiovascular;  Laterality: N/A;   INCISION AND DRAINAGE BREAST ABSCESS Left 09/2017    LEFT HEART CATH AND CORONARY ANGIOGRAPHY N/A 05/05/2020   Procedure: LEFT HEART CATH AND CORONARY ANGIOGRAPHY;  Surgeon: Iran Ouch, MD;  Location: [facility];  Service: Cardiovascular;  Laterality: N/A;   LEFT HEART CATH AND CORONARY ANGIOGRAPHY N/A 05/23/2020   Procedure: LEFT HEART CATH AND CORONARY ANGIOGRAPHY;  Surgeon: Corky Crafts, MD;  Location: [facility] INVASIVE CV LAB;  Service: Cardiovascular;  Laterality: N/A;   OB History   No obstetric history on file.    Home Medications    Prior to Admission medications   Medication Sig Start Date End Date Taking? Authorizing Provider  albuterol (VENTOLIN HFA) 108 (90 Base) MCG/ACT inhaler Inhale 2 puffs into the lungs every 6 (six) hours as needed for wheezing or shortness of breath. NEEDS PASS 07/24/20  Yes Claiborne Rigg, NP  aspirin 81 MG chewable tablet Chew 1 tablet (81 mg total) by mouth daily. 04/16/21  Yes Little Ishikawa, MD  atorvastatin (LIPITOR) 80 MG tablet Take 1 tablet (80 mg total) by mouth daily. 10/29/22  Yes Little Ishikawa, MD  ezetimibe (ZETIA) 10 MG tablet Take 1 tablet (10 mg total) by mouth daily. 10/29/22  Yes Little Ishikawa, MD  metoprolol succinate (TOPROL-XL) 25 MG 24 hr tablet Take 0.5 tablets (12.5 mg total) by mouth daily. 10/29/22  Yes Little Ishikawa, MD  Multiple Vitamins-Minerals (MULTI ADULT GUMMIES PO) Take 1 tablet by mouth daily.   Yes [provider]  ticagrelor (BRILINTA) 90 MG TABS tablet Take 1 tablet (90 mg total) by mouth 2 (two) times daily. 11/07/22  Yes Little Ishikawa, MD  acetaminophen (TYLENOL) 500 MG tablet Take 500-1,000 mg by mouth every 6 (six) hours as needed (for pain.).    [provider]  Evolocumab (REPATHA SURECLICK) 140 MG/ML SOAJ Inject 140 mg into the skin every 14 (fourteen) days. 01/08/22   Little Ishikawa, MD  nitrofurantoin, macrocrystal-monohydrate, (MACROBID) 100 MG capsule Take 1 capsule (100 mg  total) by mouth 2 (two) times daily. 07/08/22   Hoy Register, MD  nitroGLYCERIN (NITROSTAT) 0.4 MG SL tablet Place 1 tablet (0.4 mg total) under the tongue every 5 (five) minutes x 3 doses as needed for chest pain. 09/20/21   Little Ishikawa, MD    Family History Family History  Problem Relation Age of Onset   Hypertension Mother    Heart attack Father 74       Had CABG, but died at age 35 after trauma   Social History Social History   Tobacco Use   Smoking status: Former    Current packs/day: 0.00    Types: Cigarettes    Quit date: 05/06/2020    Years since quitting: 2.7   Smokeless tobacco: Never   Tobacco comments:    Alaijha was given 1-800-quit-now. Serrita started back smoking 06/07/22.  Vaping Use   Vaping status: Former   Quit date: 05/06/2020  Substance Use Topics   Alcohol use: No   Drug use: No   Allergies   Patient has no known allergies.  Review of Systems Review of Systems Pertinent findings revealed after performing a 14 point review of systems has been noted in the history of present illness.  Physical Exam Vital Signs BP 117/76 (BP Location: Right Arm)   Pulse 68   Temp 97.9 F (36.6 C) (Oral)   Resp 16   Ht 5\' 8"  (1.727 m)   Wt 141 lb 12.8 oz (64.3 kg)   LMP 01/30/2023 (Exact Date)   SpO2 98%   BMI 21.56 kg/m   No data found.  Physical Exam  Visual Acuity Right Eye Distance:   Left Eye Distance:   Bilateral Distance:    Right Eye Near:   Left Eye Near:    Bilateral Near:     UC Couse / Diagnostics / Procedures:     Radiology No results found.  Procedures Procedures (including critical care time) EKG  Pending results:  Labs Reviewed  POCT URINALYSIS DIP (MANUAL ENTRY) - Abnormal; Notable for the following components:      Result Value   Color, UA straw (*)    Spec Grav, UA >=1.030 (*)    Blood, UA large (*)    Protein Ur, POC =100 (*)    Nitrite, UA Positive (*)    Leukocytes, UA Small (1+) (*)    All other  components within normal limits  POCT URINE PREGNANCY  CERVICOVAGINAL ANCILLARY ONLY    Medications Ordered in UC: Medications - No data to display  UC Diagnoses / Final Clinical Impressions(s)   I have reviewed the triage vital signs and the nursing notes.  Pertinent labs & imaging results that were available during my care of the patient were reviewed by me and considered in my medical decision making (see chart for details).    Final diagnoses:  Acute cystitis with hematuria   STD screening was performed, patient advised that the results be posted to their MyChart and if any of the results are positive, they will be notified by phone, further treatment will be provided as indicated based on results of STD screening. Patient  was advised to abstain from sexual intercourse until that they receive the results of their STD testing.  Patient was also advised to use condoms to protect themselves from STD exposure.  Urine dip today was positive.   Patient was advised to begin antibiotics now due to findings on urine dip. Patient was advised to begin antibiotics today due to having active symptoms of urinary tract infection.                    Patient was advised to take all doses exactly as prescribed.  Patient also advised of risks of worsening infection with incomplete antibiotic therapy. Return precautions advised.  Please see discharge instructions below for details of plan of care as provided to patient. ED Prescriptions     Medication Sig Dispense Auth. Provider   nitrofurantoin, macrocrystal-monohydrate, (MACROBID) 100 MG capsule Take 1 capsule (100 mg total) by mouth 2 (two) times daily for 5 days. 10 capsule Theadora Rama Scales, PA-C      PDMP not reviewed this encounter.  Disposition Upon Discharge:  Condition: stable for discharge home  Patient presented with concern for an acute illness with associated systemic symptoms and significant discomfort requiring urgent  management. In my opinion, this is a condition that a prudent lay person (someone who possesses an average knowledge of health and medicine) may potentially expect to result in complications if not addressed urgently such as respiratory distress, impairment of bodily function or dysfunction of bodily organs.   As such, the patient has been evaluated and assessed, work-up was performed and treatment was provided in alignment with urgent care protocols and evidence based medicine.  Patient/parent/caregiver has been advised that the patient may require follow up for further testing and/or treatment if the symptoms continue in spite of treatment, as clinically indicated and appropriate.  Routine symptom specific, illness specific and/or disease specific instructions were discussed with the patient and/or caregiver at length.  Prevention strategies for avoiding STD exposure were also discussed.  The patient will follow up with their current PCP if and as advised. If the patient does not currently have a PCP we will assist them in obtaining one.   The patient may need specialty follow up if the symptoms continue, in spite of conservative treatment and management, for further workup, evaluation, consultation and treatment as clinically indicated and appropriate.  Patient/parent/caregiver verbalized understanding and agreement of plan as discussed.  All questions were addressed during visit.  Please see discharge instructions below for further details of plan.  Discharge Instructions:   Discharge Instructions      Common causes of urinary tract infections include but are not limited to holding your urine longer than you should, squatting instead of sitting down when urinating, sitting around in wet clothing such as a wet swimsuit or gym clothes too long, not emptying your bladder after having sexual intercourse, wiping from back to front instead of front to back after having a bowel movement.     The  urinalysis that we performed in the clinic today was abnormal.     You were advised to begin antibiotics today because your urinalysis is abnormal and you are having active symptoms of an acute lower urinary tract infection also known as cystitis.     Please pick up and begin taking your prescription for Macrobid (nitrofurantoin) as soon as possible.  Please take all doses exactly as prescribed.  You can take this medication with or without food.  This medication is safe to  take with your other medications.  The results of your vaginal swab test which screens for BV, yeast, gonorrhea, chlamydia and trichomonas will be made posted to your MyChart account once it is complete.  This typically takes 2 to 4 days.  Please abstain from sexual intercourse of any kind, vaginal, oral or anal, until you have received the results of your STD testing.      If any of your results are abnormal, you will receive a phone call regarding treatment.  Prescriptions, if any are needed, will be provided for you at your pharmacy.       If you have not had complete resolution of your symptoms after completing treatment as prescribed, please return to urgent care for repeat evaluation or follow-up with your primary care provider.  Repeat urinalysis and urine culture may be indicated for more directed therapy.   Thank you for visiting [facility] today.  We appreciate the opportunity to participate in your care.       This office note has been dictated using Teaching laboratory technician.  Unfortunately, this method of dictation can sometimes lead to typographical or grammatical errors.  I apologize for your inconvenience in advance if this occurs.  Please do not hesitate to reach out to me if clarification is needed.       Theadora Rama Scales, [place] 01/30/23 [phone]

## 2023-01-30 NOTE — ED Triage Notes (Addendum)
Urinary Frequency, painful urination onset 2 weeks ago. No vaginal odor or discharge. Slight discomfort/itch.   Patient having no appetite and unintentional weight loss over the last 3 months. Patient states she has lost around 20 lbs.   Patient states she does get a yeast infection with antibiotics if given those today.

## 2023-01-30 NOTE — Discharge Instructions (Addendum)
Common causes of urinary tract infections include but are not limited to holding your urine longer than you should, squatting instead of sitting down when urinating, sitting around in wet clothing such as a wet swimsuit or gym clothes too long, not emptying your bladder after having sexual intercourse, wiping from back to front instead of front to back after having a bowel movement.     The urinalysis that we performed in the clinic today was abnormal.     You were advised to begin antibiotics today because your urinalysis is abnormal and you are having active symptoms of an acute lower urinary tract infection also known as cystitis.     Please pick up and begin taking your prescription for Macrobid (nitrofurantoin) as soon as possible.  Please take all doses exactly as prescribed.  You can take this medication with or without food.  This medication is safe to take with your other medications.  The results of your vaginal swab test which screens for BV, yeast, gonorrhea, chlamydia and trichomonas will be made posted to your MyChart account once it is complete.  This typically takes 2 to 4 days.  Please abstain from sexual intercourse of any kind, vaginal, oral or anal, until you have received the results of your STD testing.      If any of your results are abnormal, you will receive a phone call regarding treatment.  Prescriptions, if any are needed, will be provided for you at your pharmacy.       If you have not had complete resolution of your symptoms after completing treatment as prescribed, please return to urgent care for repeat evaluation or follow-up with your primary care provider.  Repeat urinalysis and urine culture may be indicated for more directed therapy.   Thank you for visiting [facility] today.  We appreciate the opportunity to participate in your care.

## 2023-01-31 ENCOUNTER — Telehealth (HOSPITAL_BASED_OUTPATIENT_CLINIC_OR_DEPARTMENT_OTHER): Payer: Self-pay | Admitting: Emergency Medicine

## 2023-01-31 LAB — CERVICOVAGINAL ANCILLARY ONLY
Bacterial Vaginitis (gardnerella): POSITIVE — AB
Candida Glabrata: NEGATIVE
Candida Vaginitis: NEGATIVE
Chlamydia: NEGATIVE
Comment: NEGATIVE
Comment: NEGATIVE
Comment: NEGATIVE
Comment: NEGATIVE
Comment: NEGATIVE
Comment: NORMAL
Neisseria Gonorrhea: NEGATIVE
Trichomonas: POSITIVE — AB

## 2023-01-31 MED ORDER — METRONIDAZOLE 500 MG PO TABS: 500.0000 mg | ORAL_TABLET | Freq: Two times a day (BID) | ORAL | 0 refills | Status: DC

## 2023-02-01 ENCOUNTER — Other Ambulatory Visit (HOSPITAL_COMMUNITY): Payer: Self-pay

## 2023-02-11 ENCOUNTER — Other Ambulatory Visit (HOSPITAL_COMMUNITY): Payer: Self-pay

## 2023-02-13 ENCOUNTER — Other Ambulatory Visit (HOSPITAL_COMMUNITY): Payer: Self-pay

## 2023-02-13 ENCOUNTER — Other Ambulatory Visit: Payer: Self-pay

## 2023-02-13 ENCOUNTER — Telehealth: Payer: Self-pay | Admitting: Cardiology

## 2023-02-13 ENCOUNTER — Observation Stay (HOSPITAL_COMMUNITY)
Admission: EM | Admit: 2023-02-13 | Discharge: 2023-02-13 | Disposition: A | Discharge status: Home / Self Care | Payer: No Typology Code available for payment source | Attending: Cardiology | Admitting: Cardiology

## 2023-02-13 ENCOUNTER — Observation Stay (HOSPITAL_COMMUNITY): Payer: No Typology Code available for payment source

## 2023-02-13 ENCOUNTER — Other Ambulatory Visit: Payer: Self-pay | Admitting: Cardiology

## 2023-02-13 ENCOUNTER — Encounter (HOSPITAL_COMMUNITY): Payer: Self-pay | Admitting: Emergency Medicine

## 2023-02-13 ENCOUNTER — Emergency Department (HOSPITAL_COMMUNITY): Payer: No Typology Code available for payment source

## 2023-02-13 DIAGNOSIS — R072 Precordial pain: Secondary | ICD-10-CM | POA: Diagnosis not present

## 2023-02-13 DIAGNOSIS — R002 Palpitations: Secondary | ICD-10-CM

## 2023-02-13 DIAGNOSIS — Z955 Presence of coronary angioplasty implant and graft: Secondary | ICD-10-CM | POA: Diagnosis not present

## 2023-02-13 DIAGNOSIS — Z79899 Other long term (current) drug therapy: Secondary | ICD-10-CM | POA: Diagnosis not present

## 2023-02-13 DIAGNOSIS — Z87891 Personal history of nicotine dependence: Secondary | ICD-10-CM | POA: Insufficient documentation

## 2023-02-13 DIAGNOSIS — J45909 Unspecified asthma, uncomplicated: Secondary | ICD-10-CM | POA: Insufficient documentation

## 2023-02-13 DIAGNOSIS — Z7982 Long term (current) use of aspirin: Secondary | ICD-10-CM | POA: Insufficient documentation

## 2023-02-13 DIAGNOSIS — R079 Chest pain, unspecified: Principal | ICD-10-CM | POA: Diagnosis present

## 2023-02-13 DIAGNOSIS — Z720 Tobacco use: Secondary | ICD-10-CM | POA: Diagnosis present

## 2023-02-13 DIAGNOSIS — E785 Hyperlipidemia, unspecified: Secondary | ICD-10-CM | POA: Diagnosis not present

## 2023-02-13 DIAGNOSIS — I2510 Atherosclerotic heart disease of native coronary artery without angina pectoris: Principal | ICD-10-CM | POA: Diagnosis present

## 2023-02-13 DIAGNOSIS — E7800 Pure hypercholesterolemia, unspecified: Secondary | ICD-10-CM

## 2023-02-13 DIAGNOSIS — I25118 Atherosclerotic heart disease of native coronary artery with other forms of angina pectoris: Secondary | ICD-10-CM

## 2023-02-13 LAB — COMPREHENSIVE METABOLIC PANEL WITH GFR
ALT: 20 U/L (ref 0–44)
AST: 24 U/L (ref 15–41)
Albumin: 4.6 g/dL (ref 3.5–5.0)
Alkaline Phosphatase: 94 U/L (ref 38–126)
Anion gap: 7 (ref 5–15)
BUN: 13 mg/dL (ref 6–20)
CO2: 28 mmol/L (ref 22–32)
Calcium: 10 mg/dL (ref 8.9–10.3)
Chloride: 104 mmol/L (ref 98–111)
Creatinine, Ser: 0.84 mg/dL (ref 0.44–1.00)
GFR, Estimated: >60 mL/min
Glucose, Bld: 106 mg/dL — ABNORMAL HIGH (ref 70–99)
Potassium: 3.6 mmol/L (ref 3.5–5.1)
Sodium: 139 mmol/L (ref 135–145)
Total Bilirubin: 0.5 mg/dL
Total Protein: 8.2 g/dL — ABNORMAL HIGH (ref 6.5–8.1)

## 2023-02-13 LAB — CBC WITH DIFFERENTIAL/PLATELET
Abs Immature Granulocytes: 0.01 10*3/uL (ref 0.00–0.07)
Basophils Absolute: 0 10*3/uL (ref 0.0–0.1)
Basophils Relative: 0 %
Eosinophils Absolute: 0.1 10*3/uL (ref 0.0–0.5)
Eosinophils Relative: 2 %
HCT: 41.5 % (ref 36.0–46.0)
Hemoglobin: 13.5 g/dL (ref 12.0–15.0)
Immature Granulocytes: 0 %
Lymphocytes Relative: 31 %
Lymphs Abs: 2.2 10*3/uL (ref 0.7–4.0)
MCH: 31 pg (ref 26.0–34.0)
MCHC: 32.5 g/dL (ref 30.0–36.0)
MCV: 95.2 fL (ref 80.0–100.0)
Monocytes Absolute: 0.6 10*3/uL (ref 0.1–1.0)
Monocytes Relative: 8 %
Neutro Abs: 4.3 10*3/uL (ref 1.7–7.7)
Neutrophils Relative %: 59 %
Platelets: 256 10*3/uL (ref 150–400)
RBC: 4.36 MIL/uL (ref 3.87–5.11)
RDW: 14.3 % (ref 11.5–15.5)
WBC: 7.3 10*3/uL (ref 4.0–10.5)
nRBC: 0 % (ref 0.0–0.2)

## 2023-02-13 LAB — TROPONIN I (HIGH SENSITIVITY)
Troponin I (High Sensitivity): 2 ng/L
Troponin I (High Sensitivity): <2 ng/L (ref <18)

## 2023-02-13 LAB — HCG, SERUM, QUALITATIVE: Preg, Serum: NEGATIVE

## 2023-02-13 SURGERY — LEFT HEART CATH AND CORONARY ANGIOGRAPHY
Anesthesia: LOCAL

## 2023-02-13 MED ORDER — NITROGLYCERIN 0.4 MG SL SUBL: 0.4000 mg | SUBLINGUAL_TABLET | SUBLINGUAL | Status: DC | PRN

## 2023-02-13 MED ORDER — ASPIRIN 81 MG PO CHEW
324.0000 mg | CHEWABLE_TABLET | Freq: Once | ORAL | Status: AC
  Administered 2023-02-13: 324 mg via ORAL
  Filled 2023-02-13: qty 4

## 2023-02-13 NOTE — ED Notes (Signed)
Pt returned from xray

## 2023-02-13 NOTE — ED Provider Notes (Addendum)
[place] EMERGENCY DEPARTMENT AT [facility] Provider Note   CSN: [id number] Arrival date & time: 02/13/23  4098     History  Chief Complaint  Patient presents with   Chest Pain    Susan Sexton is a 37 y.o. female.  37 yo F with a cc of chest pain.  Going on for the past week.  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  She is not sure if this feels the same.  Denies trauma denies cough congestion denies fever.   Chest Pain      Home Medications Prior to Admission medications   Medication Sig Start Date End Date Taking? Authorizing Provider  albuterol (VENTOLIN HFA) 108 (90 Base) MCG/ACT inhaler Inhale 2 puffs into the lungs every 6 (six) hours as needed for wheezing or shortness of breath. NEEDS PASS 07/24/20   Claiborne Rigg, NP  aspirin 81 MG chewable tablet Chew 1 tablet (81 mg total) by mouth daily. 04/16/21   Little Ishikawa, MD  atorvastatin (LIPITOR) 80 MG tablet Take 1 tablet (80 mg total) by mouth daily. 10/29/22   Little Ishikawa, MD  Evolocumab (REPATHA SURECLICK) 140 MG/ML SOAJ Inject 140 mg into the skin every 14 (fourteen) days. 01/08/22   Little Ishikawa, MD  ezetimibe (ZETIA) 10 MG tablet Take 1 tablet (10 mg total) by mouth daily. 10/29/22   Little Ishikawa, MD  metoprolol succinate (TOPROL-XL) 25 MG 24 hr tablet Take 0.5 tablets (12.5 mg total) by mouth daily. 10/29/22   Little Ishikawa, MD  metroNIDAZOLE (FLAGYL) 500 MG tablet Take 1 tablet (500 mg total) by mouth 2 (two) times daily. 01/31/23   Carlisle Beers, FNP  Multiple Vitamins-Minerals (MULTI ADULT GUMMIES PO) Take 1 tablet by mouth daily.    [provider]  nitroGLYCERIN (NITROSTAT) 0.4 MG SL tablet Place 1 tablet (0.4 mg total) under  the tongue every 5 (five) minutes x 3 doses as needed for chest pain. 09/20/21   Little Ishikawa, MD  ticagrelor (BRILINTA) 90 MG TABS tablet Take 1 tablet (90 mg total) by mouth 2 (two) times daily. 11/07/22   Little Ishikawa, MD      Allergies    Patient has no known allergies.    Review of Systems   Review of Systems  Cardiovascular:  Positive for chest pain.    Physical Exam Updated Vital Signs BP 114/61 (BP Location: Left Arm)   Pulse 72   Temp 98.2 F (36.8 C) (Oral)   Resp 18   LMP 01/30/2023 (Exact Date)   SpO2 99%  Physical Exam Vitals and nursing note reviewed.  Constitutional:      General: She is not in acute distress.    Appearance: She is well-developed. She is not diaphoretic.  HENT:     Head: Normocephalic and atraumatic.  Eyes:     Pupils: Pupils are equal, round, and reactive to light.  Cardiovascular:     Rate and Rhythm: Normal rate and regular rhythm.     Heart sounds: No murmur heard.    No friction rub. No gallop.  Pulmonary:     Effort: Pulmonary effort is normal.     Breath sounds: No wheezing or rales.  Abdominal:     General: There is no distension.     Palpations: Abdomen is soft.     Tenderness: There is no abdominal tenderness.  Musculoskeletal:        General: No tenderness.     Cervical back: Normal range of motion and neck supple.  Skin:    General: Skin is warm and dry.  Neurological:     Mental Status: She is alert and oriented to person, place, and time.  Psychiatric:        Behavior: Behavior normal.     ED Results / Procedures / Treatments   Labs (all labs ordered are listed, but only abnormal results are displayed) Labs Reviewed  COMPREHENSIVE METABOLIC PANEL - Abnormal; Notable for the following components:      Result Value   Glucose, Bld 106 (*)    Total Protein 8.2 (*)    All other components within normal limits  CBC WITH DIFFERENTIAL/PLATELET  HCG, SERUM, QUALITATIVE  TROPONIN I (HIGH  SENSITIVITY)  TROPONIN I (HIGH SENSITIVITY)    EKG EKG Interpretation Date/Time:  Thursday February 13 2023 07:58:26 EST Ventricular Rate:  68 PR Interval:  158 QRS Duration:  89 QT Interval:  405 QTC Calculation: 431 R Axis:   58  Text Interpretation: Sinus rhythm downsloping st segments in inferior leads seen on prior Otherwise no significant change Confirmed by Melene Plan [phone]) on 02/13/2023 8:03:54 AM  Radiology DG Chest 2 View  Result Date: 02/13/2023 CLINICAL DATA:  37 year old female with chest pain and tachycardia. EXAM: CHEST - 2 VIEW COMPARISON:  05/04/2022 chest radiographs and earlier. FINDINGS: PA and lateral views 0653 hours. Lung volumes and mediastinal contours remain normal. Visualized tracheal air column is within normal limits. Lung markings appear stable from last year, both lungs appear clear. No pneumothorax or pleural effusion. Negative visible bowel gas and osseous structures. IMPRESSION: Negative.  No cardiopulmonary abnormality. Electronically Signed   By: Odessa Fleming M.D.   On: 02/13/2023 07:49    Procedures Procedures    Medications Ordered in ED Medications  nitroGLYCERIN (NITROSTAT) SL tablet 0.4 mg (has no administration in time range)  aspirin chewable tablet 324 mg (324 mg Oral Given 02/13/23 0910)    ED Course/ Medical Decision Making/ A&P                                 Medical Decision Making Amount and/or Complexity of Data Reviewed Labs: ordered. Radiology: ordered. ECG/medicine tests: ordered.  Risk OTC drugs. Prescription drug management. Decision regarding hospitalization.   37 yo F with a chief complaint of chest pain.  Has been going on for about a week.  There is some components that are typical.  Seems occur only with exertion and resolves with rest.  She is not sure if this feels like her prior heart attacks.  At 37 the patient already has 5 stents.  Her last total cholesterol was 250.    Her troponin today is negative.  Her  EKG does have some depression inferiorly that is similar to when she was seen for her MI in 2022.    I discussed the case with Dr. Odis Hollingshead, he recommended admission for possible cardiac catheterization.  Patient eval by cards in the ED.  Patient now declining admission.  Plan for STAT echo and d/c if no significant wall motion abnormalities.  The patients results and plan were reviewed and discussed.   Any  x-rays performed were independently reviewed by myself.   Differential diagnosis were considered with the presenting HPI.  Medications  nitroGLYCERIN (NITROSTAT) SL tablet 0.4 mg (has no administration in time range)  aspirin chewable tablet 324 mg (324 mg Oral Given 02/13/23 0910)    Vitals:   02/13/23 0624  BP: 114/61  Pulse: 72  Resp: 18  Temp: 98.2 F (36.8 C)  TempSrc: Oral  SpO2: 99%    Final diagnoses:  Chest pain with high risk for cardiac etiology          Final Clinical Impression(s) / ED Diagnoses Final diagnoses:  Chest pain with high risk for cardiac etiology    Rx / DC Orders ED Discharge Orders     None         Melene Plan, DO 02/13/23 0920    Melene Plan, DO 02/13/23 1447

## 2023-02-13 NOTE — Telephone Encounter (Signed)
Hey would you be able to help me get this patient set up with lipid clinic?  I think she previously had issues with prior authorization for Repatha in August however has new and insurance so may be able to cover now.  Let me know if there is anything else I need to do thanks

## 2023-02-13 NOTE — H&P (Addendum)
Cardiology Admission History and Physical   Patient ID: Susan Sexton MRN: [id number]; DOB: 04/04/1985   Admission date: 02/13/2023  PCP:  Hoy Register, MD   [facility] Providers Cardiologist:  Little Ishikawa, MD   {  Chief Complaint:  chest pain   Patient Profile:   Susan Sexton is a 37 y.o. female with CAD status post DES to RCA, LAD in 2010, hyperlipidemia, who is being seen 02/13/2023 for the evaluation of chest pain.  History of Present Illness:   Ms. Shone had a NSTEMI in February 2022.  LHC on 05/05/2020 showed 99% proximal RCA stenosis, 50% proximal to mid RCA, 20% distal RCA, 40% proximal to mid LCx, 80% proximal to mid LAD. RCA disease was treated with DES. Staged PCI to the LAD was done on 05/23/2020. Underwent DES x2 to proximal/mid LAD. This was complicated by likely guide catheter dissection of the left main, which was treated with DES.  Continued to have chest discomfort and palpitations in May 2024 and had lexiscan that was normal with breast attenuation artifact. Also normal heart monitor. She had preserved EF on both lexi and surface echocardiogram.  She has been on aspirin and Brilinta chronically given extensive stents.  She was also being referred to lipid clinic for further control of her hyperlipidemia, started on repatha.   Currently patient is being evaluated for symptoms consistent with heart palpitations but might have a chest pain component.  Very difficult for her to discern whether this is pain or just palpitations and anxiety.  No associated symptoms.  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  Her symptoms are relieved after resting.  However, she does describe discomfort when she is driving and also at rest.  She sought emergency evaluation because shes had severe anxiety and depression since her of heart attack and is concerned about her livelihood.   She has been on aspirin and Brilinta chronically, but does report missing maybe 1 dose of her Brilinta at least once a week.  She denies any heart failure symptoms orthopnea peripheral edema.  EKG here shows subtle ST downsloping in the inferior lead.  Troponins negative x 2.  Chest x-ray negative.  CBC and CMP unrevealing.  Total cholesterol 251, LDL 194.   Past Medical History:  Diagnosis Date   Asthma    Coronary artery disease    High cholesterol    Hyperlipidemia LDL goal <100 05/04/2020   NSTEMI (non-ST elevated myocardial infarction) [facility])     Past Surgical History:  Procedure Laterality Date   BREAST LUMPECTOMY Left 06/6/219   CARDIAC CATHETERIZATION     CORONARY STENT INTERVENTION N/A 05/05/2020   Procedure: CORONARY STENT INTERVENTION;  Surgeon: Iran Ouch, MD;  Location: [facility] INVASIVE CV LAB;  Service: Cardiovascular;  Laterality: N/A;   CORONARY STENT INTERVENTION N/A 05/23/2020   Procedure: CORONARY STENT INTERVENTION;  Surgeon: Corky Crafts, MD;  Location: [facility] INVASIVE CV LAB;  Service: Cardiovascular;  Laterality: N/A;   CORONARY STENT PLACEMENT  05/05/2020   CORONARY ULTRASOUND/IVUS N/A 05/23/2020   Procedure: Intravascular Ultrasound/IVUS;  Surgeon: Corky Crafts, MD;  Location: [facility] INVASIVE CV LAB;  Service: Cardiovascular;  Laterality: N/A;   INCISION AND DRAINAGE BREAST ABSCESS Left 09/2017   LEFT HEART CATH AND CORONARY ANGIOGRAPHY N/A 05/05/2020   Procedure: LEFT HEART CATH AND CORONARY ANGIOGRAPHY;  Surgeon: Iran Ouch, MD;  Location: [facility] INVASIVE CV  LAB;  Service: Cardiovascular;  Laterality: N/A;   LEFT HEART CATH AND CORONARY ANGIOGRAPHY N/A 05/23/2020   Procedure: LEFT HEART CATH AND CORONARY ANGIOGRAPHY;  Surgeon: Corky Crafts, MD;  Location: [facility] INVASIVE CV LAB;  Service: Cardiovascular;  Laterality: N/A;     Medications Prior to Admission: Prior to Admission medications   Medication Sig Start Date End Date Taking?  Authorizing Provider  albuterol (VENTOLIN HFA) 108 (90 Base) MCG/ACT inhaler Inhale 2 puffs into the lungs every 6 (six) hours as needed for wheezing or shortness of breath. NEEDS PASS 07/24/20   Claiborne Rigg, NP  aspirin 81 MG chewable tablet Chew 1 tablet (81 mg total) by mouth daily. 04/16/21   Little Ishikawa, MD  atorvastatin (LIPITOR) 80 MG tablet Take 1 tablet (80 mg total) by mouth daily. 10/29/22   Little Ishikawa, MD  Evolocumab (REPATHA SURECLICK) 140 MG/ML SOAJ Inject 140 mg into the skin every 14 (fourteen) days. 01/08/22   Little Ishikawa, MD  ezetimibe (ZETIA) 10 MG tablet Take 1 tablet (10 mg total) by mouth daily. 10/29/22   Little Ishikawa, MD  metoprolol succinate (TOPROL-XL) 25 MG 24 hr tablet Take 0.5 tablets (12.5 mg total) by mouth daily. 10/29/22   Little Ishikawa, MD  metroNIDAZOLE (FLAGYL) 500 MG tablet Take 1 tablet (500 mg total) by mouth 2 (two) times daily. 01/31/23   Carlisle Beers, FNP  Multiple Vitamins-Minerals (MULTI ADULT GUMMIES PO) Take 1 tablet by mouth daily.    [provider]  nitroGLYCERIN (NITROSTAT) 0.4 MG SL tablet Place 1 tablet (0.4 mg total) under the tongue every 5 (five) minutes x 3 doses as needed for chest pain. 09/20/21   Little Ishikawa, MD  ticagrelor (BRILINTA) 90 MG TABS tablet Take 1 tablet (90 mg total) by mouth 2 (two) times daily. 11/07/22   Little Ishikawa, MD     Allergies:   No Known Allergies  Social History:   Social History   Socioeconomic History   Marital status: Single    Spouse name: Not on file   Number of children: Not on file   Years of education: 11   Highest education level: 11th grade  Occupational History   Not on file  Tobacco Use   Smoking status: Former    Current packs/day: 0.00    Types: Cigarettes    Quit date: 05/06/2020    Years since quitting: 2.7   Smokeless tobacco: Never   Tobacco comments:    Zailah was given 1-800-quit-now.  Alfredo started back smoking 06/07/22.  Vaping Use   Vaping status: Former   Quit date: 05/06/2020  Substance and Sexual Activity   Alcohol use: No   Drug use: No   Sexual activity: Yes    Birth control/protection: None  Other Topics Concern   Not on file  Social History Narrative   Not on file   Social Determinants of Health   Financial Resource Strain: Medium Risk (02/26/2022)   Overall Financial Resource Strain (CARDIA)    Difficulty of Paying Living Expenses: Somewhat hard  Food Insecurity: Food Insecurity Present (02/26/2022)   Hunger Vital Sign    Worried About Running Out of Food in the Last Year: Sometimes true    Ran Out of Food in the Last Year: Sometimes true  Transportation Needs: No Transportation Needs (02/26/2022)   PRAPARE - Administrator, Civil Service (Medical): No    Lack of Transportation (Non-Medical): No  Physical Activity:  Not on file  Stress: Stress Concern Present (10/19/2021)   [employer] of Occupational Health - Occupational Stress Questionnaire    Feeling of Stress : Rather much  Social Connections: Unknown (07/08/2022)   Received from [facility], [facility]   Social Network    Social Network: Not on file  Intimate Partner Violence: Not At Risk (07/08/2022)   Received from [facility], [facility]   HITS    Over the last 12 months how often did your partner physically hurt you?: Never    Over the last 12 months how often did your partner insult you or talk down to you?: Never    Over the last 12 months how often did your partner threaten you with physical harm?: Never    Over the last 12 months how often did your partner scream or curse at you?: Never    Family History:  The patient's family history includes Heart attack (age of onset: 99) in her father; Hypertension in her mother.    ROS:  Please see the history of present illness.  All other ROS reviewed and negative.     Physical Exam/Data:   Vitals:   02/13/23  0624 02/13/23 1110 02/13/23 1200  BP: 114/61  (!) 131/59  Pulse: 72  60  Resp: 18  16  Temp: 98.2 F (36.8 C) 98.1 F (36.7 C) 98.4 F (36.9 C)  TempSrc: Oral Oral Oral  SpO2: 99%  100%   No intake or output data in the 24 hours ending 02/13/23 1536    01/30/2023    4:37 PM 11/07/2022    2:41 PM 09/26/2022   11:43 AM  Last 3 Weights  Weight (lbs) 141 lb 12.8 oz 154 lb 161 lb  Weight (kg) 64.32 kg 69.854 kg 73.029 kg     There is no height or weight on file to calculate BMI.  General:  Well nourished, well developed, in no acute distress HEENT: normal Neck: no JVD Vascular: No carotid bruits; Distal pulses 2+ bilaterally   Cardiac:  normal S1, S2; RRR; no murmur  Lungs:  clear to auscultation bilaterally, no wheezing, rhonchi or rales  Abd: soft, nontender, no hepatomegaly  Ext: no edema Musculoskeletal:  No deformities, BUE and BLE strength normal and equal Skin: warm and dry  Neuro:  CNs 2-12 intact, no focal abnormalities noted Psych:  Normal affect    EKG: Normal sinus rhythm, rate 80.  Subtle ST downsloping in 3.  Relevant CV Studies: 14-day heart monitor 08/12/2022 Patch Wear Time:  13 days and 0 hours (2024-05-14T07:25:20-0400 to 2024-05-27T08:00:23-0400)   Patient had a min HR of 50 bpm, max HR of 152 bpm, and avg HR of 88 bpm. Predominant underlying rhythm was Sinus Rhythm. 1 run of Supraventricular Tachycardia occurred lasting 4 beats with a max rate of 146 bpm (avg 124 bpm). Isolated SVEs were rare  (<1.0%), SVE Couplets were rare (<1.0%), and SVE Triplets were rare (<1.0%). Isolated VEs were rare (<1.0%), and no VE Couplets or VE Triplets were present.  No patient triggered events  Left heart catheterization 05/05/2020 Prox RCA lesion is 99% stenosed. Prox RCA to Mid RCA lesion is 50% stenosed. Dist RCA lesion is 20% stenosed. Prox Cx to Mid Cx lesion is 40% stenosed. Prox LAD to Mid LAD lesion is 80% stenosed. A drug-eluting stent was successfully placed  using a STENT RESOLUTE ONYX [id number]. Post intervention, there is a 0% residual stenosis. Post intervention, there is a 0% residual stenosis.  1.  Significant two-vessel coronary artery disease.  The culprit is 99% stenosis in the proximal RCA.  In addition, the mid RCA has moderate diffuse disease.  The LAD has 80% proximal/mid LAD at the bifurcation of a large diagonal branch. 2.  Left ventricular angiography was not performed.  EF was normal by echo.  Mildly elevated left ventricular end-diastolic pressure.\ 3.  Successful angioplasty and drug-eluting stent placement to the RCA.  I elected to use a long stent to cover the moderate disease in the mid vessel in addition to the critical stenosis in the proximal segment.   Recommendations: Dual antiplatelet therapy for at least 12 months. Aggressive treatment of risk factors especially her hyperlipidemia.  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 Recommend staged LAD PCI in few weeks.  This was not done today given difficulty sedating the patient. Can likely discharge home tomorrow if no issues.  Echocardiogram 05/05/2020 1. Left ventricular ejection fraction, by estimation, is 60 to 65%. The  left ventricle has normal function. The left ventricle has no regional  wall motion abnormalities. There is mild left ventricular hypertrophy.  Left ventricular diastolic parameters  were normal.   2. Right ventricular systolic function is normal. The right ventricular  size is normal. There is normal pulmonary artery systolic pressure. The  estimated right ventricular systolic pressure is 35.8 mmHg.   3. Right atrial size was mildly dilated.   4. The mitral valve is normal in structure. No evidence of mitral valve  regurgitation. No evidence of mitral stenosis.   5. The aortic valve is normal in structure. Aortic valve regurgitation is  not visualized. No aortic stenosis is present.   6. The  inferior vena cava is dilated in size with <50% respiratory  variability, suggesting right atrial pressure of 15 mmHg.      Laboratory Data:  High Sensitivity Troponin:   Recent Labs  Lab 02/13/23 0700 02/13/23 0818  TROPONINIHS 2 <2      Chemistry Recent Labs  Lab 02/13/23 0700  NA 139  K 3.6  CL 104  CO2 28  GLUCOSE 106*  BUN 13  CREATININE 0.84  CALCIUM 10.0  GFRNONAA >60  ANIONGAP 7    Recent Labs  Lab 02/13/23 0700  PROT 8.2*  ALBUMIN 4.6  AST 24  ALT 20  ALKPHOS 94  BILITOT 0.5   Lipids No results for input(s): "CHOL", "TRIG", "HDL", "LABVLDL", "LDLCALC", "CHOLHDL" in the last 168 hours. Hematology Recent Labs  Lab 02/13/23 0700  WBC 7.3  RBC 4.36  HGB 13.5  HCT 41.5  MCV 95.2  MCH 31.0  MCHC 32.5  RDW 14.3  PLT 256   Thyroid No results for input(s): "TSH", "FREET4" in the last 168 hours. BNPNo results for input(s): "BNP", "PROBNP" in the last 168 hours.  DDimer No results for input(s): "DDIMER" in the last 168 hours.   Radiology/Studies:  DG Chest 2 View  Result Date: 02/13/2023 CLINICAL DATA:  37 year old female with chest pain and tachycardia. EXAM: CHEST - 2 VIEW COMPARISON:  05/04/2022 chest radiographs and earlier. FINDINGS: PA and lateral views 0653 hours. Lung volumes and mediastinal contours remain normal. Visualized tracheal air column is within normal limits. Lung markings appear stable from last year, both lungs appear clear. No pneumothorax or pleural effusion. Negative visible bowel gas and osseous structures. IMPRESSION: Negative.  No cardiopulmonary abnormality. Electronically Signed   By: Odessa Fleming M.D.   On: 02/13/2023 07:49  Assessment and Plan:   Chest pain versus palpitations CAD status post DES to RCA x2 and LAD x3 2022 2022 cath with 99% proximal RCA stenosis, 50% proximal to mid RCA, 20% distal RCA, 40% proximal to mid LCx, 80% proximal to mid LAD. Staged PCI to the LAD was done on 05/23/2020. Underwent DES x2 to  proximal/mid LAD. This was complicated by likely guide catheter dissection of the left main, which was treated with DES.  Difficult to discern which is which per the patient, actually sounds more like palpitations.  She does note an exertional component however symptoms do occur at rest.  She has had previous Lexiscan in May 2024 for these symptoms and had heart monitor.  Both of these results were negative, very rare episodes of ectopy less than 1%.  Given her high risk cardiac history, uncontrolled cholesterol, subtle ST downsloping on EKG (neg trops), and persistent vague symptoms we will proceed with left heart catheterization for definitive evaluation. Also had reported poor compliancy with her DAPT.  Continue DAPT with aspirin 81 mg, Brilinta 90 mg twice daily, atorvastatin 80 mg, Zetia 10 mg, Repatha, and Toprol-XL 12.5 mg daily. Could consider uptitrating Toprol-XL if palpitations become greater suspicion. Echo with preserved EF in 2022, and normal on lexi on May 2024  Hyperlipidemia Likely has a familial component.  LDL activity has increased steadily since a year ago.  Currently 194.   Continue medications as above.  Reports compliancy with these medications though.   Follows lipid clinic.  Repeat lipids.  Nicotine dependence Previously had quit however with her stress she is smoking 2 to 3 cigarettes daily.  Encouraged cessation  Depression and anxiety Reports significant mental health burden since her heart attack and is concerned of her livelihood.  Is established with PCP however not happy with her care there, needs to follow up with somebody about this as this is contributing.  Addendum: Patient has now elected not to continue with cardiac catheterization.  Spoke with attending and the plan will be to perform echocardiogram here to ensure no severe wall motion abnormalities.  If stable she may discharge and I will facilitate follow-up with Dr. Bjorn Pippin and at that time he can consider  possible cardiac PET scan if still not amenable to cardiac catheterization.  This has been discussed with ER doctor.   Risk Assessment/Risk Scores:    Code Status: Full Code  Severity of Illness: The appropriate patient status for this patient is OBSERVATION. Observation status is judged to be reasonable and necessary in order to provide the required intensity of service to ensure the patient's safety. The patient's presenting symptoms, physical exam findings, and initial radiographic and laboratory data in the context of their medical condition is felt to place them at decreased risk for further clinical deterioration. Furthermore, it is anticipated that the patient will be medically stable for discharge from the hospital within 2 midnights of admission.    For questions or updates, please contact [facility] Please consult [email or web address] for contact info under     Signed, Abagail Kitchens, PA-C  02/13/2023 3:36 PM    ADDENDUM:   Patient seen and examined with Abagail Kitchens, PA-C  I personally taken a history, examined the patient, reviewed relevant notes,  laboratory data / imaging studies.  I performed a substantive portion of this encounter and formulated the important aspects of the plan.  I agree with the APP's note, impression, and recommendations; however, I have edited the note to reflect  changes or salient points.  Patient presents to the hospital with a chief complaint of chest pain.  She has a strong family history of premature coronary disease, she also had her first NSTEMI at a young age in 2002, has had multiple PCI interventions in the past.  As noted above she has been experiencing fluttering/chest discomfort with lifting heavy objects/overexerting activities.  Discomfort is located substernally, and usually improves with resting and relaxing.     PHYSICAL EXAM: Today's Vitals   02/13/23 0624 02/13/23 0634 02/13/23 1110 02/13/23 1200  BP: 114/61   (!) 131/59   Pulse: 72   60  Resp: 18   16  Temp: 98.2 F (36.8 C)  98.1 F (36.7 C) 98.4 F (36.9 C)  TempSrc: Oral  Oral Oral  SpO2: 99%   100%  PainSc:  6      There is no height or weight on file to calculate BMI.   Net IO Since Admission: No IO data has been entered for this period [02/13/23 1536]  There were no vitals filed for this visit.  Physical Exam  Constitutional: No distress.  hemodynamically stable  Neck: No JVD present.  Cardiovascular: Normal rate, regular rhythm, S1 normal and S2 normal. Exam reveals no gallop, no S3 and no S4.  No murmur heard. Pulmonary/Chest: Effort normal and breath sounds normal. No stridor. She has no wheezes. She has no rales.  Abdominal: Soft. Bowel sounds are normal. She exhibits no distension. There is no abdominal tenderness.  Musculoskeletal:        General: No edema.     Cervical back: Neck supple.  Neurological: She is alert and oriented to person, place, and time. She has intact cranial nerves (2-12).  Skin: Skin is warm.    EKG: (personally reviewed by me) 02/13/2023: Sinus rhythm, 68 bpm, ST depressions in the inferior leads consider ischemia, without underlying injury pattern.  Telemetry: (personally reviewed by me) NSR   Impression:  Established coronary artery disease with prior coronary interventions and chest pain Prior NSTEMI Premature CAD Hyperlipidemia, pure (likely familial) Palpitations Tobacco abuse  Recommendations:  High sensitive troponins have been negative x 2.  EKG shows ST depressions in the inferior leads which were not noted on the EKG from 07/17/2022.  EKG from today does not illustrate evidence of STEMI.    Her most recent ischemic workup was in May 2024 SPECT study which was reported to be low risk.  Patient was recommended to be admitted under observation with plans for diagnostic left heart catheterization with possible intervention if needed given the fact that she has symptoms concerning for typical  chest pain despite a low risk nuclear stress test in the recent past.  She has multiple cardiovascular risk factors as outlined above and her most recent lipid profile is also not well-controlled with an LDL of 194 mg/dL.  Initially, patient agreed to be admitted under cardiology for additional workup.  However later she refused to be admitted and wanted to be discharged home.  She does not want to proceed forward with left heart catheterization at this time.  She also refused an echocardiogram.  Risks, benefits, and alternatives discussed.  Patient states that she will follow-up with her outpatient cardiologist Dr. Bjorn Pippin or his APP.   Based on her home medication she is currently on dual antiplatelet therapy.  She is compliant with Lipitor and Zetia.  Patient was in the process of getting Repatha approved but has not been on it for the last  2 months.  Will send a message to the lipid clinic to see where the prior authorization status is.  Patient is asked to follow-up regarding this in the next week.  Patient is aware to return back to the ED if she has recurrence of chest pain.  I have asked her to avoid overexertion.  Further recommendations to follow as the case evolves.   This note was created using a voice recognition software as a result there may be grammatical errors inadvertently enclosed that do not reflect the nature of this encounter. Every attempt is made to correct such errors.   Tessa Lerner, DO, [facility]  [address] [place], [place] [address] Pager: [phone] Office: [phone] 02/13/2023 3:36 PM

## 2023-02-13 NOTE — Telephone Encounter (Signed)
Noted   Patient called with concerns about repatha, message was sent to pharmacy team  Please see other 12/5 TE

## 2023-02-13 NOTE — Telephone Encounter (Signed)
Patient currently is being seen in the emergency room for palpitations/chest pain neg trops.  Initial plan was for cardiac catheterization however patient now deferring.  Could you please help arrange follow-up with Dr. Bjorn Pippin in the next 2 weeks if possible or later or with a APP if he is agreeable.  May consider cardiac PET scan but will defer to him if this is warranted.  We will obtain echocardiogram here to ensure no regional wall motion abnormalities.  Dr. Bjorn Pippin please let me know if you have any other recommendations or change in plan.

## 2023-02-13 NOTE — Telephone Encounter (Signed)
Pt c/o medication issue:  1. Name of Medication:  Repatha  2. How are you currently taking this medication (dosage and times per day)?   3. Are you having a reaction (difficulty breathing--STAT)?   4. What is your medication issue?   Patient would like to know if she has been taken off Repatha. She states she went to the pharmacy to pick it up and she was informed that it was discontinued. Please advise.

## 2023-02-13 NOTE — Discharge Summary (Addendum)
Discharge Summary    Patient ID: Susan Sexton MRN: [id number]; DOB: [birth year]  Admit date: 02/13/2023 Discharge date: 02/13/2023  PCP:  Susan Register, MD   [facility] Providers Cardiologist:  Susan Ishikawa, MD   {  Discharge Diagnoses    Principal Problem:   CAD (coronary artery disease) Active Problems:   Tobacco abuse   Hyperlipidemia LDL goal <70   Palpitations    Diagnostic Studies/Procedures    14-day heart monitor 08/12/2022 Patch Wear Time:  13 days and 0 hours (2024-05-14T07:25:20-0400 to 2024-05-27T08:00:23-0400)   Patient had a min HR of 50 bpm, max HR of 152 bpm, and avg HR of 88 bpm. Predominant underlying rhythm was Sinus Rhythm. 1 run of Supraventricular Tachycardia occurred lasting 4 beats with a max rate of 146 bpm (avg 124 bpm). Isolated SVEs were rare  (<1.0%), SVE Couplets were rare (<1.0%), and SVE Triplets were rare (<1.0%). Isolated VEs were rare (<1.0%), and no VE Couplets or VE Triplets were present.  No patient triggered events   Left heart catheterization 05/05/2020 Prox RCA lesion is 99% stenosed. Prox RCA to Mid RCA lesion is 50% stenosed. Dist RCA lesion is 20% stenosed. Prox Cx to Mid Cx lesion is 40% stenosed. Prox LAD to Mid LAD lesion is 80% stenosed. A drug-eluting stent was successfully placed using a STENT RESOLUTE ONYX [id number]. Post intervention, there is a 0% residual stenosis. Post intervention, there is a 0% residual stenosis.   1.  Significant two-vessel coronary artery disease.  The culprit is 99% stenosis in the proximal RCA.  In addition, the mid RCA has moderate diffuse disease.  The LAD has 80% proximal/mid LAD at the bifurcation of a large diagonal branch. 2.  Left ventricular angiography was not performed.  EF was normal by echo.  Mildly elevated left ventricular end-diastolic pressure.\ 3.  Successful angioplasty and drug-eluting stent placement to the RCA.  I elected to use a long stent to cover the  moderate disease in the mid vessel in addition to the critical stenosis in the proximal segment.   Recommendations: Dual antiplatelet therapy for at least 12 months. Aggressive treatment of risk factors especially her hyperlipidemia.  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 Recommend staged LAD PCI in few weeks.  This was not done today given difficulty sedating the patient. Can likely discharge home tomorrow if no issues.   Echocardiogram 05/05/2020 1. Left ventricular ejection fraction, by estimation, is 60 to 65%. The  left ventricle has normal function. The left ventricle has no regional  wall motion abnormalities. There is mild left ventricular hypertrophy.  Left ventricular diastolic parameters  were normal.   2. Right ventricular systolic function is normal. The right ventricular  size is normal. There is normal pulmonary artery systolic pressure. The  estimated right ventricular systolic pressure is 35.8 mmHg.   3. Right atrial size was mildly dilated.   4. The mitral valve is normal in structure. No evidence of mitral valve  regurgitation. No evidence of mitral stenosis.   5. The aortic valve is normal in structure. Aortic valve regurgitation is  not visualized. No aortic stenosis is present.   6. The inferior vena cava is dilated in size with <50% respiratory  variability, suggesting right atrial pressure of 15 mmHg.  _____________   History of Present Illness     Susan Sexton is a 37 y.o. female with CAD status post DES to RCA, LAD in  2010, hyperlipidemia, who is being seen 02/13/2023 for the evaluation of chest pain.  Susan Sexton had a NSTEMI in February 2022.  LHC on 05/05/2020 showed 99% proximal RCA stenosis, 50% proximal to mid RCA, 20% distal RCA, 40% proximal to mid LCx, 80% proximal to mid LAD. RCA disease was treated with DES. Staged PCI to the LAD was done on 05/23/2020. Underwent DES x2 to proximal/mid LAD. This  was complicated by likely guide catheter dissection of the left main, which was treated with DES.  Continued to have chest discomfort and palpitations in May 2024 and had lexiscan that was normal with breast attenuation artifact. Also normal heart monitor. Susan Sexton had preserved EF on both lexi and surface echocardiogram.  Susan Sexton has been on aspirin and Brilinta chronically given extensive stents.  Susan Sexton was also being referred to lipid clinic for further control of her hyperlipidemia but not started on repatha (insurance coverage).     Patient was being evaluated for symptoms consistent with heart palpitations but might have a chest pain component.  Very difficult for her to discern whether this is pain or just palpitations and anxiety. 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  Discomfort is substernal, heart beating/racing/pain, symptoms are relieved after resting.  However, Susan Sexton does describe discomfort when Susan Sexton is driving and also at rest.  Susan Sexton sought emergency evaluation because shes had severe anxiety and depression since her of heart attack and is concerned about her livelihood.  Susan Sexton has been on aspirin and Brilinta chronically, but does report missing maybe 1 dose of her Brilinta at least once a week.  Susan Sexton denies any heart failure symptoms orthopnea peripheral edema.   EKG here shows subtle ST downsloping in the inferior lead.  Troponins negative x 2.  Chest x-ray negative.  CBC and CMP unrevealing.  Total cholesterol 251, LDL 194.  Hospital Course     Consultants:  None  Chest pain versus palpitations Given her symptoms of chest pain/?  Palpitations Susan Sexton presented to the ED for further evaluation and management given her complex cardiovascular history at a relatively young age and multiple cardiovascular risk factors.   Her symptoms very well could be secondary to palpitations.  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  Her high sensitive troponins were negative x 2. EKG illustrated sinus rhythm 68 bpm with ST depressions in the inferior leads (see on prior tracing).  Family history of premature coronary disease, multiple coronary interventions herself at relatively young age of 35, cholesterol is not well-controlled with a total cholesterol of 251 and LDL of 194.   Given her presentation, history, Susan Sexton was recommended to be admitted under observation for further workup.  Initially Susan Sexton agreed and the plan was to proceed forward with echocardiogram and left heart catheterization with possible intervention given her symptoms.  However either due to change in mind or due to prolonged ER wait time patient refused workup and was adamant that Susan Sexton wants to leave or be discharged.  Susan Sexton did not want to stay for left heart catheterization which I offered to be done the same day plus or minus echocardiogram.  Patient understands that undiagnosed/untreated/progressive CAD can lead to worsening morbidity mortality and even death.   Susan Sexton even voiced that Susan Sexton would leave AMA. Susan Sexton has the capacity to make informed decisions.   To help facilitate care -we arranged outpatient  follow-up on 03/07/2023. Patient states that Susan Sexton is now on Medicaid -asked my PA to send in a prescription for Repatha to her pharmacy to start initiate therapy for reasons mentioned above. Susan Sexton is asked to return back to the ED if Susan Sexton has any worsening symptoms of chest pain at rest or with effort related activities.  Susan Sexton verbalized understanding. Outpatient cardiac PET/CT could be considered to evaluate for reversible ischemia. Continue her home medications.  Hyperlipidemia Likely has a familial component.  LDL currently 194.   Continue medications as above.  I have sent message to pharmacy team to help reestablish care with lipid clinic.  Previously denied but  has new insurance so hopefully were able to obtain now.   Nicotine dependence Previously had quit however with her stress Susan Sexton is smoking 2 to 3 cigarettes daily.  Encouraged cessation   Depression and anxiety Reports significant mental health burden since her heart attack and is concerned of her livelihood.  Is established with PCP however not happy with her care there, needs to follow up with somebody about this as this is contributing.  I have sent staff message to triage team as well as primary MD to help follow-up with patient and possible consideration of cardiac PET scan as outpatient.   Did the patient have an acute coronary syndrome (MI, NSTEMI, STEMI, etc) this admission?:  No                               Did the patient have a percutaneous coronary intervention (stent / angioplasty)?:  No.    _____________  Discharge Vitals Blood pressure (!) 131/59, pulse 60, temperature 98.4 F (36.9 C), temperature source Oral, resp. rate 16, last menstrual period 01/30/2023, SpO2 100%.  There were no vitals filed for this visit.  Labs & Radiologic Studies    CBC Recent Labs    02/13/23 0700  WBC 7.3  NEUTROABS 4.3  HGB 13.5  HCT 41.5  MCV 95.2  PLT 256   Basic Metabolic Panel Recent Labs    19/14/78 0700  NA 139  K 3.6  CL 104  CO2 28  GLUCOSE 106*  BUN 13  CREATININE 0.84  CALCIUM 10.0   Liver Function Tests Recent Labs    02/13/23 0700  AST 24  ALT 20  ALKPHOS 94  BILITOT 0.5  PROT 8.2*  ALBUMIN 4.6   No results for input(s): "LIPASE", "AMYLASE" in the last 72 hours. High Sensitivity Troponin:   Recent Labs  Lab 02/13/23 0700 02/13/23 0818  TROPONINIHS 2 <2    BNP Invalid input(s): "POCBNP" D-Dimer No results for input(s): "DDIMER" in the last 72 hours. Hemoglobin A1C No results for input(s): "HGBA1C" in the last 72 hours. Fasting Lipid Panel No results for input(s): "CHOL", "HDL", "LDLCALC", "TRIG", "CHOLHDL", "LDLDIRECT" in the last 72  hours. Thyroid Function Tests No results for input(s): "TSH", "T4TOTAL", "T3FREE", "THYROIDAB" in the last 72 hours.  Invalid input(s): "FREET3" _____________  DG Chest 2 View  Result Date: 02/13/2023 CLINICAL DATA:  36 year old female with chest pain and tachycardia. EXAM: CHEST - 2 VIEW COMPARISON:  05/04/2022 chest radiographs and earlier. FINDINGS: PA and lateral views 0653 hours. Lung volumes and mediastinal contours remain normal. Visualized tracheal air column is within normal limits. Lung markings appear stable from last year, both lungs appear clear. No pneumothorax or pleural effusion. Negative visible bowel gas and osseous structures. IMPRESSION: Negative.  No cardiopulmonary abnormality. Electronically  Signed   By: Odessa Fleming M.D.   On: 02/13/2023 07:49   Disposition   Pt is being discharged home today at her request.  But we have arranged care for her to best possible standard keeping her wishes in mind.  Patient has the capacity to make her decision and is aware that unaddressed coronary artery disease can lead to but not limited to heart failure, arrhythmia, death.   Follow-up Plans & Appointments    Joni Reining NP  [facility] line office.  03/07/2023 at 2:20pm  Discharge Medications   Allergies as of 02/13/2023   No Known Allergies      Medication List     TAKE these medications    albuterol 108 (90 Base) MCG/ACT inhaler Commonly known as: VENTOLIN HFA Inhale 2 puffs into the lungs every 6 (six) hours as needed for wheezing or shortness of breath. NEEDS PASS   aspirin 81 MG chewable tablet Chew 1 tablet (81 mg total) by mouth daily.   atorvastatin 80 MG tablet Commonly known as: LIPITOR Take 1 tablet (80 mg total) by mouth daily.   ezetimibe 10 MG tablet Commonly known as: Zetia Take 1 tablet (10 mg total) by mouth daily.   metoprolol succinate 25 MG 24 hr tablet Commonly known as: TOPROL-XL Take 0.5 tablets (12.5 mg total) by mouth daily.    metroNIDAZOLE 500 MG tablet Commonly known as: FLAGYL Take 1 tablet (500 mg total) by mouth 2 (two) times daily.   MULTI ADULT GUMMIES PO Take 1 tablet by mouth daily.   nitroGLYCERIN 0.4 MG SL tablet Commonly known as: NITROSTAT Place 1 tablet (0.4 mg total) under the tongue every 5 (five) minutes x 3 doses as needed for chest pain.   ticagrelor 90 MG Tabs tablet Commonly known as: BRILINTA Take 1 tablet (90 mg total) by mouth 2 (two) times daily.           Outstanding Labs/Studies    Duration of Discharge Encounter   Greater than 30 minutes including physician time.  Signed, Abagail Kitchens, PA-C 02/13/2023, 6:04 PM  ADDENDUM:   Patient seen and examined with Yvonna Alanis PA  I personally taken a history, examined the patient, reviewed relevant notes,  laboratory data / imaging studies.  I performed a substantive portion of this encounter and formulated the important aspects of the plan.  I agree with the APP's note, impression, and recommendations; however, I have edited the note to reflect changes or salient points (which are noted in italics).   Patient initially agreed to be admitted under observation for additional workup given her cardiovascular history and symptoms.  Later voice that Susan Sexton wanted to leave AMA and Susan Sexton has a capacity to make such decisions.  However to facilitate care recommendations as noted above.   This note was created using a voice recognition software as a result there may be grammatical errors inadvertently enclosed that do not reflect the nature of this encounter. Every attempt is made to correct such errors.   Tessa Lerner, DO, [facility]  [facility]  [address] [place], [place] [address] Pager: [phone] Office: [phone]

## 2023-02-13 NOTE — ED Notes (Addendum)
Patient leaving at this time. Encouraged to stay until discharge paperwork was completed, but patient refused. Pt advised to look at MyChart for further follow up.

## 2023-02-13 NOTE — Progress Notes (Signed)
Attempted Echocardiogram, After speaking with the patient repeatedly about doing the exam she refused. I explained that we can do a limited study she refused. RN came in and also spoke with the patient she continuously refused.

## 2023-02-13 NOTE — ED Triage Notes (Signed)
PT reports chest pain for a few days she thought would improve. Feels like she is having palpations. Unintentional weight loss. States doesn't know if job too stressful.

## 2023-02-14 ENCOUNTER — Other Ambulatory Visit (HOSPITAL_COMMUNITY): Payer: Self-pay

## 2023-02-14 ENCOUNTER — Telehealth: Payer: Self-pay | Admitting: Pharmacy Technician

## 2023-02-14 ENCOUNTER — Other Ambulatory Visit (HOSPITAL_COMMUNITY): Payer: No Typology Code available for payment source

## 2023-02-14 ENCOUNTER — Other Ambulatory Visit: Payer: Self-pay

## 2023-02-14 DIAGNOSIS — E7800 Pure hypercholesterolemia, unspecified: Secondary | ICD-10-CM

## 2023-02-14 DIAGNOSIS — I2510 Atherosclerotic heart disease of native coronary artery without angina pectoris: Secondary | ICD-10-CM

## 2023-02-14 DIAGNOSIS — I214 Non-ST elevation (NSTEMI) myocardial infarction: Secondary | ICD-10-CM

## 2023-02-14 MED ORDER — REPATHA SURECLICK 140 MG/ML ~~LOC~~ SOAJ
1.0000 mL | SUBCUTANEOUS | 1 refills | Status: AC
  Filled 2023-02-14 – 2023-02-21 (×3): qty 6, 84d supply, fill #0
  Filled 2023-06-08 – 2023-07-18 (×2): qty 6, 84d supply, fill #1

## 2023-02-14 MED ORDER — TICAGRELOR 90 MG PO TABS
90.0000 mg | ORAL_TABLET | Freq: Two times a day (BID) | ORAL | 2 refills | Status: AC
  Filled 2023-02-14: qty 180, 90d supply, fill #0

## 2023-02-14 NOTE — Telephone Encounter (Signed)
Pharmacy Patient Advocate Encounter   Received notification from Pt Calls Messages that prior authorization for repatha is required/requested.   Insurance verification completed.   The patient is insured through [facility] .   Per test claim: PA required; PA submitted to above mentioned insurance via CoverMyMeds Key/confirmation #/EOC [facility] Status is pending

## 2023-02-14 NOTE — Telephone Encounter (Signed)
Can we add her to my schedule at 920 on 12/16?

## 2023-02-14 NOTE — Telephone Encounter (Signed)
Pharmacy Patient Advocate Encounter   Received notification from Pt Calls Messages that prior authorization for repatha is required/requested.   Insurance verification completed.   The patient is insured through [facility] .   Per test claim: PA required; PA submitted to above mentioned insurance via CoverMyMeds Key/confirmation #/EOC [id number] Status is pending

## 2023-02-14 NOTE — Addendum Note (Signed)
Addended by: Cheree Ditto on: 02/14/2023 03:19 PM   Modules accepted: Orders

## 2023-02-14 NOTE — Telephone Encounter (Signed)
Pharmacy Patient Advocate Encounter  Received notification from [facility] that Prior Authorization for repatha has been APPROVED from 02/14/23 to 02/14/24   PA #/Case ID/Reference #: [id number]

## 2023-02-17 ENCOUNTER — Other Ambulatory Visit: Payer: Self-pay

## 2023-02-20 ENCOUNTER — Ambulatory Visit: Payer: No Typology Code available for payment source | Admitting: Physician Assistant

## 2023-02-20 ENCOUNTER — Other Ambulatory Visit: Payer: Self-pay

## 2023-02-20 ENCOUNTER — Other Ambulatory Visit (HOSPITAL_COMMUNITY): Payer: Self-pay

## 2023-02-20 NOTE — Telephone Encounter (Signed)
Called patient and schedule appt for 12/16 @9 :20 am per Dr. Bjorn Pippin request. Patient voiced an understand and informed writer she will be tranferring to [facility] for her Cardiologist care after this visit.

## 2023-02-20 NOTE — Progress Notes (Unsigned)
Patient ID: Susan Sexton, female   DOB: December 01, 1985, 37 y.o.   MRN: [id number]  After ED visit 02/13/2023: 37 yo F with a cc of chest pain. Going on for the past week. 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 She is not sure if this feels the same. Denies trauma denies cough congestion denies fever.   A/P: 37 yo F with a chief complaint of chest pain.  Has been going on for about a week.  There is some components that are typical.  Seems occur only with exertion and resolves with rest.  She is not sure if this feels like her prior heart attacks.  At 37 the patient already has 5 stents.  Her last total cholesterol was 250.    Her troponin today is negative.  Her EKG does have some depression inferiorly that is similar to when she was seen for her MI in 2022.     I discussed the case with Dr. Odis Hollingshead, he recommended admission for possible cardiac catheterization.   Patient eval by cards in the ED.  Patient now declining admission.  Plan for STAT echo and d/c if no significant wall motion abnormalities.

## 2023-02-21 ENCOUNTER — Other Ambulatory Visit (HOSPITAL_COMMUNITY): Payer: Self-pay

## 2023-02-21 ENCOUNTER — Other Ambulatory Visit: Payer: Self-pay

## 2023-02-23 NOTE — Progress Notes (Unsigned)
Cardiology Office Note:    Date:  02/23/2023   ID:  Susan Sexton, DOB 1985/07/01, MRN [id number]  PCP:  Hoy Register, MD  Cardiologist:  Little Ishikawa, MD  Electrophysiologist:  None   Referring MD: Hoy Register, MD   No chief complaint on file.   History of Present Illness:    Susan Sexton is a 37 y.o. female with a hx of CAD, hyperlipidemia, tobacco use who presents for follow-up.  She was admitted to [facility] on 05/04/2020 with NSTEMI.  She had presented to med [facility] ED with chest pain.  Found to have mildly elevated troponins (peak 156).  Despite her age, she was noted to have significant CAD risk factors as suspected FH given LDL to 34 and family history including father having CABG in his 37s.  Echocardiogram was done on 05/05/2020 which showed normal biventricular function, no significant valvular disease.  LHC on 05/05/2020 showed 99% proximal RCA stenosis, 50% proximal to mid RCA, 20% distal RCA, 40% proximal to mid LCx, 80% proximal to mid LAD.  RCA disease was treated with DES.  Staged PCI to the LAD was done on 05/23/2020.  Underwent DES x2 to proximal/mid LAD.  This was complicated by likely guide catheter dissection of the left main, which was treated with DES.  Lexiscan Myoview 07/2022 showed normal perfusion (with likely breast attenuation artifact), EF 57%.  Zio patch x 13 days 07/2022 showed no significant arrhythmias.  She was admitted 02/2023 with chest pain and palpitations.  Troponins negative x 2.  LHC and echocardiogram were recommended however patient left AMA.  Since last clinic visit,  she reports she is doing okay.  Denies any recent chest pain or dyspnea.  Reports some lightheadedness but denies any syncope.  Denies any lower extremity edema or palpitations.  She had quit smoking but is now back to smoking 1 cigarette/day.  Past Medical History:  Diagnosis Date   Asthma    Coronary artery disease    High cholesterol    Hyperlipidemia LDL  goal <100 05/04/2020   NSTEMI (non-ST elevated myocardial infarction) [facility])     Past Surgical History:  Procedure Laterality Date   BREAST LUMPECTOMY Left 06/6/219   CARDIAC CATHETERIZATION     CORONARY STENT INTERVENTION N/A 05/05/2020   Procedure: CORONARY STENT INTERVENTION;  Surgeon: Iran Ouch, MD;  Location: [facility] INVASIVE CV LAB;  Service: Cardiovascular;  Laterality: N/A;   CORONARY STENT INTERVENTION N/A 05/23/2020   Procedure: CORONARY STENT INTERVENTION;  Surgeon: Corky Crafts, MD;  Location: [facility] INVASIVE CV LAB;  Service: Cardiovascular;  Laterality: N/A;   CORONARY STENT PLACEMENT  05/05/2020   CORONARY ULTRASOUND/IVUS N/A 05/23/2020   Procedure: Intravascular Ultrasound/IVUS;  Surgeon: Corky Crafts, MD;  Location: [facility] INVASIVE CV LAB;  Service: Cardiovascular;  Laterality: N/A;   INCISION AND DRAINAGE BREAST ABSCESS Left 09/2017   LEFT HEART CATH AND CORONARY ANGIOGRAPHY N/A 05/05/2020   Procedure: LEFT HEART CATH AND CORONARY ANGIOGRAPHY;  Surgeon: Iran Ouch, MD;  Location: [facility] INVASIVE CV LAB;  Service: Cardiovascular;  Laterality: N/A;   LEFT HEART CATH AND CORONARY ANGIOGRAPHY N/A 05/23/2020   Procedure: LEFT HEART CATH AND CORONARY ANGIOGRAPHY;  Surgeon: Corky Crafts, MD;  Location: [facility] INVASIVE CV LAB;  Service: Cardiovascular;  Laterality: N/A;    Current Medications: No outpatient medications have been marked as taking for the 02/24/23 encounter (Appointment) with Little Ishikawa, MD.     Allergies:   Patient has no  known allergies.   Social History   Socioeconomic History   Marital status: Single    Spouse name: Not on file   Number of children: Not on file   Years of education: 11   Highest education level: 11th grade  Occupational History   Not on file  Tobacco Use   Smoking status: Former    Current packs/day: 0.00    Types: Cigarettes    Quit date: 05/06/2020    Years since quitting: 2.8   Smokeless tobacco:  Never   Tobacco comments:    Teah was given 1-800-quit-now. Tona started back smoking 06/07/22.  Vaping Use   Vaping status: Former   Quit date: 05/06/2020  Substance and Sexual Activity   Alcohol use: No   Drug use: No   Sexual activity: Yes    Birth control/protection: None  Other Topics Concern   Not on file  Social History Narrative   Not on file   Social Drivers of Health   Financial Resource Strain: Medium Risk (02/26/2022)   Overall Financial Resource Strain (CARDIA)    Difficulty of Paying Living Expenses: Somewhat hard  Food Insecurity: Low Risk  (02/19/2023)   Received from [facility]   Hunger Vital Sign    Worried About Running Out of Food in the Last Year: Never true    Ran Out of Food in the Last Year: Never true  Transportation Needs: No Transportation Needs (02/19/2023)   Received from [facility]    In the past 12 months, has lack of reliable transportation kept you from medical appointments, meetings, work or from getting things needed for daily living? : No  Physical Activity: Not on file  Stress: Stress Concern Present (10/19/2021)   [employer] of Occupational Health - Occupational Stress Questionnaire    Feeling of Stress : Rather much  Social Connections: Unknown (07/08/2022)   Received from [facility]   Social Network    Social Network: Not on file     Family History: The patient's family history includes Heart attack (age of onset: 49) in her father; Hypertension in her mother.  ROS:   Please see the history of present illness.   All other systems reviewed and are negative.  EKGs/Labs/Other Studies Reviewed:    The following studies were reviewed today:   EKG:   05/17/2020: normal sinus rhythm, rate 91, no ST/T abnormalities 07/10/2020: normal sinus rhythm, rate 66 bpm, no ST/T abnormalities   Recent Labs: 07/30/2022: TSH 1.490 02/13/2023: ALT 20; BUN 13; Creatinine, Ser 0.84; Hemoglobin 13.5;  Platelets 256; Potassium 3.6; Sodium 139  Recent Lipid Panel    Component Value Date/Time   CHOL 251 (H) 11/07/2022 1527   TRIG 52 11/07/2022 1527   HDL 49 11/07/2022 1527   CHOLHDL 5.1 (H) 11/07/2022 1527   CHOLHDL 4.7 05/05/2020 0012   VLDL 10 05/05/2020 0012   LDLCALC 194 (H) 11/07/2022 1527    Physical Exam:    VS:  LMP 01/30/2023 (Exact Date)     Wt Readings from Last 3 Encounters:  01/30/23 141 lb 12.8 oz (64.3 kg)  11/07/22 154 lb (69.9 kg)  09/26/22 161 lb (73 kg)     GEN:  Well nourished, well developed in no acute distress HEENT: Normal NECK: No JVD; No carotid bruits CARDIAC: RRR, no murmurs, rubs, gallops RESPIRATORY:  Clear to auscultation without rales, wheezing or rhonchi  ABDOMEN: Soft, non-tender, non-distended MUSCULOSKELETAL:  No edema; No deformity  SKIN: Warm and  dry NEUROLOGIC:  Alert and oriented x 3 PSYCHIATRIC:  Normal affect   ASSESSMENT:    No diagnosis found.     PLAN:    CAD: Presented with NSTEMI 05/04/2020.  Echocardiogram was done on 05/05/2020 which showed normal biventricular function, no significant valvular disease.  LHC on 05/05/2020 showed 99% proximal RCA stenosis, 50% proximal to mid RCA, 20% distal RCA, 40% proximal to mid LCx, 80% proximal to mid LAD.  RCA disease was treated with DES.  Staged PCI to the LAD was done on 05/23/2020.  Underwent DES x2 to proximal/mid LAD.  This was complicated by likely guide catheter dissection of the left main, which was treated with DES.  Reported chest pain, underwent Lexiscan Myoview 07/2022 showed normal perfusion (with likely breast attenuation artifact), EF 57%.   -Continue aspirin 81 mg daily, ticagrelor 90 mg twice daily.  Given extensive stenting, will continue DAPT  -LDL 194 on 11/07/2022, she had been off her medications.  Restart atorvastatin, Zetia, Repatha*** -Continue Toprol-XL 12.5 mg daily -Recommend stress PET***  Hyperlipidemia: Suspect FH given LDL 282 in 01/2020 and family  history including father had CABG in 30s. LDL 194 on 11/07/2022, she had been off her medications.  Restart atorvastatin, Zetia, Repatha***  Tobacco use: Smoked 1 pack/day x 15 years.  She had quit smoking but now back to smoking 1 cigarette per day.  Encouraged cessation***  Palpitations: Zio patch x 13 days 07/2022 showed no significant arrhythmias.  Carotid bruit: no carotid stenosis on duplex 07/2022  RTC in 6 months***  Medication Adjustments/Labs and Tests Ordered: Current medicines are reviewed at length with the patient today.  Concerns regarding medicines are outlined above.  No orders of the defined types were placed in this encounter.   No orders of the defined types were placed in this encounter.    There are no Patient Instructions on file for this visit.     Signed, Little Ishikawa, MD  02/23/2023 1:54 PM    [facility]

## 2023-02-24 ENCOUNTER — Ambulatory Visit: Payer: No Typology Code available for payment source | Attending: Cardiology | Admitting: Cardiology

## 2023-02-25 ENCOUNTER — Encounter: Payer: Self-pay | Admitting: Cardiology

## 2023-03-03 NOTE — Progress Notes (Deleted)
  Cardiology Office Note:  .   Date:  03/03/2023  ID:  Susan Sexton, DOB June 15, 1985, MRN [id number] PCP: Hoy Register, MD  [facility] Providers Cardiologist:  Little Ishikawa, MD {  }   History of Present Illness: .   Susan Sexton is a 37 y.o. female with known history of premature coronary artery disease status post drug-eluting stent to the RCA, LAD in 2010, hyperlipidemia.  The patient had an NSTEMI in February 2022.  She was hospitalized in December 2020 for recurrent chest pain.  Unfortunately patient left AMA although left heart catheterization was offered.  She had significant anxiety and depression associated with her diagnosis of coronary artery disease.  She is here for outpatient follow-up.  She was told to return to the ED if she had recurrent symptoms.  Refills were given on her medications.  She was recommended for psychological counseling concerning her anxiety.  ROS: ***  Studies Reviewed: .        *** EKG Interpretation Date/Time:    Ventricular Rate:    PR Interval:    QRS Duration:    QT Interval:    QTC Calculation:   R Axis:      Text Interpretation:      Physical Exam:   VS:  There were no vitals taken for this visit.   Wt Readings from Last 3 Encounters:  01/30/23 141 lb 12.8 oz (64.3 kg)  11/07/22 154 lb (69.9 kg)  09/26/22 161 lb (73 kg)    GEN: Well nourished, well developed in no acute distress NECK: No JVD; No carotid bruits CARDIAC: ***RRR, no murmurs, rubs, gallops RESPIRATORY:  Clear to auscultation without rales, wheezing or rhonchi  ABDOMEN: Soft, non-tender, non-distended EXTREMITIES:  No edema; No deformity   ASSESSMENT AND PLAN: .   ***    {Are you ordering a CV Procedure (e.g. stress test, cath, DCCV, TEE, etc)?   Press F2        :[id number]}    Signed, Bettey Mare. Liborio Nixon, ANP, AACC

## 2023-03-07 ENCOUNTER — Ambulatory Visit: Payer: No Typology Code available for payment source | Attending: Adult Health | Admitting: Adult Health

## 2023-03-24 ENCOUNTER — Other Ambulatory Visit (HOSPITAL_COMMUNITY): Payer: Self-pay

## 2023-04-14 ENCOUNTER — Ambulatory Visit: Payer: No Typology Code available for payment source | Admitting: Nurse Practitioner

## 2023-04-18 ENCOUNTER — Telehealth: Payer: No Typology Code available for payment source | Admitting: Physician Assistant

## 2023-04-18 DIAGNOSIS — K047 Periapical abscess without sinus: Secondary | ICD-10-CM

## 2023-04-18 MED ORDER — CLINDAMYCIN HCL 300 MG PO CAPS: 300.0000 mg | ORAL_CAPSULE | Freq: Three times a day (TID) | ORAL | 0 refills | Status: AC

## 2023-04-18 NOTE — Progress Notes (Signed)

## 2023-06-09 ENCOUNTER — Other Ambulatory Visit (HOSPITAL_COMMUNITY): Payer: Self-pay

## 2023-06-09 ENCOUNTER — Encounter: Payer: Self-pay | Admitting: Pharmacist

## 2023-06-09 ENCOUNTER — Other Ambulatory Visit: Payer: Self-pay

## 2023-06-10 ENCOUNTER — Other Ambulatory Visit (HOSPITAL_COMMUNITY): Payer: Self-pay

## 2023-06-12 ENCOUNTER — Other Ambulatory Visit (HOSPITAL_COMMUNITY): Payer: Self-pay

## 2023-06-12 ENCOUNTER — Other Ambulatory Visit: Payer: Self-pay

## 2023-07-18 ENCOUNTER — Other Ambulatory Visit (HOSPITAL_COMMUNITY): Payer: Self-pay

## 2023-07-21 ENCOUNTER — Other Ambulatory Visit (HOSPITAL_COMMUNITY): Payer: Self-pay

## 2023-07-21 NOTE — Telephone Encounter (Signed)
 error

## 2023-07-22 ENCOUNTER — Other Ambulatory Visit (HOSPITAL_COMMUNITY): Payer: Self-pay

## 2023-07-23 ENCOUNTER — Other Ambulatory Visit: Payer: Self-pay

## 2023-07-23 ENCOUNTER — Other Ambulatory Visit (HOSPITAL_COMMUNITY): Payer: Self-pay

## 2023-07-31 ENCOUNTER — Encounter

## 2023-09-01 ENCOUNTER — Emergency Department (HOSPITAL_COMMUNITY)

## 2023-09-01 ENCOUNTER — Other Ambulatory Visit: Payer: Self-pay

## 2023-09-01 ENCOUNTER — Emergency Department (HOSPITAL_COMMUNITY)
Admission: EM | Admit: 2023-09-01 | Discharge: 2023-09-01 | Disposition: A | Discharge status: Home / Self Care | Attending: Emergency Medicine | Admitting: Emergency Medicine

## 2023-09-01 ENCOUNTER — Encounter (HOSPITAL_COMMUNITY): Payer: Self-pay

## 2023-09-01 DIAGNOSIS — R42 Dizziness and giddiness: Secondary | ICD-10-CM | POA: Insufficient documentation

## 2023-09-01 DIAGNOSIS — Z7982 Long term (current) use of aspirin: Secondary | ICD-10-CM | POA: Insufficient documentation

## 2023-09-01 DIAGNOSIS — R072 Precordial pain: Secondary | ICD-10-CM | POA: Insufficient documentation

## 2023-09-01 LAB — CBC
HCT: 40.9 % (ref 36.0–46.0)
Hemoglobin: 13.3 g/dL (ref 12.0–15.0)
MCH: 30.9 pg (ref 26.0–34.0)
MCHC: 32.5 g/dL (ref 30.0–36.0)
MCV: 94.9 fL (ref 80.0–100.0)
Platelets: 252 10*3/uL (ref 150–400)
RBC: 4.31 MIL/uL (ref 3.87–5.11)
RDW: 13 % (ref 11.5–15.5)
WBC: 9.7 10*3/uL (ref 4.0–10.5)
nRBC: 0 % (ref 0.0–0.2)

## 2023-09-01 LAB — BASIC METABOLIC PANEL WITH GFR
Anion gap: 11 (ref 5–15)
BUN: 11 mg/dL (ref 6–20)
CO2: 24 mmol/L (ref 22–32)
Calcium: 9.9 mg/dL (ref 8.9–10.3)
Chloride: 103 mmol/L (ref 98–111)
Creatinine, Ser: 0.97 mg/dL (ref 0.44–1.00)
GFR, Estimated: >60 mL/min (ref >60)
Glucose, Bld: 98 mg/dL (ref 70–99)
Potassium: 3.4 mmol/L — ABNORMAL LOW (ref 3.5–5.1)
Sodium: 138 mmol/L (ref 135–145)

## 2023-09-01 LAB — TROPONIN I (HIGH SENSITIVITY)
Troponin I (High Sensitivity): 6 ng/L (ref <18)
Troponin I (High Sensitivity): 4 ng/L (ref <18)

## 2023-09-01 NOTE — ED Provider Triage Note (Signed)
 Emergency Medicine Provider Triage Evaluation Note  Susan Sexton , a 38 y.o. female  was evaluated in triage.  Pt complains of anxiety with palpitations and feeling lightheaded..  Review of Systems  Positive: Palpitations Negative: Fever  Physical Exam  BP (!) 124/93 (BP Location: Right Arm)   Pulse 76   Temp 98 F (36.7 C) (Oral)   Resp 16   SpO2 100%  Normal heart rate.  Medical Decision Making  Medically screening exam initiated at 9:12 AM.  Appropriate orders placed.  BERNISE SYLVAIN was informed that the remainder of the evaluation will be completed by another provider, this initial triage assessment does not replace that evaluation, and the importance of remaining in the ED until their evaluation is complete.  Patient with palpitations near syncope.  Does have a history of anxiety but also previous MIs.  Will check basic blood work.  Will have cardiac monitor.   Patsey Lot, MD 09/01/23 [phone]

## 2023-09-01 NOTE — ED Triage Notes (Signed)
 Pt c.o not feeling right /disoriented since going into work this morning around 5am. Pt also mentions an episode of chest pain while at work. Pt has cardiac hx with multiple stents.

## 2023-09-01 NOTE — ED Notes (Signed)
 Nt called CCMD@9 :56am

## 2023-09-01 NOTE — Discharge Instructions (Addendum)
 Please read and follow all provided instructions.  Your diagnoses today include:  1. Lightheadedness   2. Precordial pain     Tests performed today include: An EKG of your heart: No changes from previous, no sign of abnormal rhythm A chest x-ray: Was clear Cardiac enzymes - a blood test for heart muscle damage, were both normal Blood counts and electrolytes: potassium was minimally low Vital signs. See below for your results today.   Medications prescribed:  None  Take any prescribed medications only as directed.  Follow-up instructions: Please follow-up with your primary care provider as soon in the next 3 days for further evaluation of your symptoms.   Return instructions:  SEEK IMMEDIATE MEDICAL ATTENTION IF: You have severe chest pain, especially if the pain is crushing or pressure-like and spreads to the arms, back, neck, or jaw, or if you have sweating, nausea or vomiting, or trouble with breathing. THIS IS AN EMERGENCY. Do not wait to see if the pain will go away. Get medical help at once. Call 911. DO NOT drive yourself to the hospital.  Your chest pain gets worse and does not go away after a few minutes of rest.  You have an attack of chest pain lasting longer than what you usually experience.  You have significant dizziness, if you pass out, or have trouble walking.  You have chest pain not typical of your usual pain for which you originally saw your caregiver.  You have any other emergent concerns regarding your health.  Additional Information: Chest pain comes from many different causes. Your caregiver has diagnosed you as having chest pain that is not specific for one problem, but does not require admission.  You are at low risk for an acute heart condition or other serious illness.   Your vital signs today were: BP 103/82   Pulse 62   Temp 98.5 F (36.9 C) (Oral)   Resp 20   SpO2 100%  If your blood pressure (BP) was elevated above 135/85 this visit, please have  this repeated by your doctor within one month. --------------

## 2023-09-01 NOTE — ED Provider Notes (Signed)
 [place] EMERGENCY DEPARTMENT AT [facility] Provider Note   CSN: [id number] Arrival date & time: 09/01/23  9146     Patient presents with: Dizziness and Chest Pain   Susan Sexton is a 38 y.o. female.   Patient with history of coronary artery disease, status post stent placement, on Brillinta (reports last dose 2 days ago), hyperlipidemia --presents to the emergency department for lightheadedness and anxiety.  Patient reports feeling rough over the past 1 week.  She reports generalized fatigue.  She feels like she is eating and drinking okay.  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  She states that she got to work at around 5 AM today.  She was pushing a heavy tote, began to feel lightheaded and disoriented.  She reports some vague very mild chest pains.  States that she was helped by coworkers.  No full syncope.  She has some nausea but no vomiting. Patient denies signs of stroke including: facial droop, slurred speech, aphasia, weakness/numbness in extremities, imbalance/trouble walking.        Prior to Admission medications   Medication Sig Start Date End Date Taking? Authorizing Provider  albuterol  (VENTOLIN  HFA) 108 (90 Base) MCG/ACT inhaler Inhale 2 puffs into the lungs every 6 (six) hours as needed for wheezing or shortness of breath. NEEDS PASS 07/24/20   Theotis Haze ORN, NP  aspirin  81 MG chewable tablet Chew 1 tablet (81 mg total) by mouth daily. 04/16/21   Kate Lonni CROME, MD  atorvastatin  (LIPITOR ) 80 MG tablet Take 1 tablet (80 mg total) by mouth daily. 10/29/22   Kate Lonni CROME, MD  Evolocumab  (REPATHA  SURECLICK) 140 MG/ML SOAJ Inject 140 mg into the skin every 14 (fourteen) days. 02/14/23   Kate Lonni CROME, MD  ezetimibe  (ZETIA ) 10 MG tablet Take 1 tablet (10 mg total) by mouth daily. 10/29/22   Kate Lonni CROME, MD  metoprolol  succinate (TOPROL -XL) 25 MG 24 hr tablet Take 0.5 tablets (12.5 mg total)  by mouth daily. 10/29/22   Kate Lonni CROME, MD  Multiple Vitamins-Minerals (MULTI ADULT GUMMIES PO) Take 1 tablet by mouth daily.    [provider]  nitroGLYCERIN  (NITROSTAT ) 0.4 MG SL tablet Place 1 tablet (0.4 mg total) under the tongue every 5 (five) minutes x 3 doses as needed for chest pain. 09/20/21   Kate Lonni CROME, MD  ticagrelor  (BRILINTA ) 90 MG TABS tablet Take 1 tablet (90 mg total) by mouth 2 (two) times daily. 11/07/22   Kate Lonni CROME, MD  ticagrelor  (BRILINTA ) 90 MG TABS tablet Take 1 tablet (90 mg total) by mouth 2 (two) times daily. 02/14/23   Kate Lonni CROME, MD    Allergies: Patient has no known allergies.    Review of Systems  Updated Vital Signs BP (!) 124/93 (BP Location: Right Arm)   Pulse 76   Temp 98.8 F (37.1 C) (Oral)   Resp 16   SpO2 100%   Physical Exam Vitals and nursing note reviewed.  Constitutional:      Appearance: She is well-developed. She is not diaphoretic.  HENT:     Head: Normocephalic and atraumatic.     Right Ear: External ear normal.     Left Ear: External ear normal.     Nose: Nose normal.     Mouth/Throat:     Mouth: Mucous membranes are not dry.     Pharynx: Uvula midline.   Eyes:     General: Lids are normal.  Extraocular Movements:     Right eye: No nystagmus.     Left eye: No nystagmus.     Conjunctiva/sclera: Conjunctivae normal.     Pupils: Pupils are equal, round, and reactive to light.   Neck:     Vascular: Normal carotid pulses. No JVD.     Trachea: Trachea normal. No tracheal deviation.   Cardiovascular:     Rate and Rhythm: Normal rate and regular rhythm.     Pulses: No decreased pulses.          Radial pulses are 2+ on the right side and 2+ on the left side.     Heart sounds: Normal heart sounds, S1 normal and S2 normal. No murmur heard. Pulmonary:     Effort: Pulmonary effort is normal. No respiratory distress.     Breath sounds: Normal breath sounds. No wheezing.   Chest:     Chest wall: No tenderness.  Abdominal:     General: Bowel sounds are normal.     Palpations: Abdomen is soft.     Tenderness: There is no abdominal tenderness. There is no guarding or rebound.   Musculoskeletal:        General: Normal range of motion.     Cervical back: Normal range of motion and neck supple. No tenderness or bony tenderness. No muscular tenderness.   Skin:    General: Skin is warm and dry.     Coloration: Skin is not pale.   Neurological:     Mental Status: She is alert and oriented to person, place, and time.     GCS: GCS eye subscore is 4. GCS verbal subscore is 5. GCS motor subscore is 6.     Cranial Nerves: No cranial nerve deficit.     Sensory: No sensory deficit.     Motor: No weakness.     Coordination: Coordination normal.     Gait: Gait normal.     Comments: Upper extremity myotomes tested bilaterally:  C5 Shoulder abduction 5/5 C6 Elbow flexion/wrist extension 5/5 C7 Elbow extension 5/5 C8 Finger flexion 5/5 T1 Finger abduction 5/5  Lower extremity myotomes tested bilaterally: L2 Hip flexion 5/5 L3 Knee extension 5/5 L4 Ankle dorsiflexion 5/5 S1 Ankle plantar flexion 5/5     ED Course  Patient seen and examined. History obtained directly from patient.   Labs/EKG: Ordered cardiac workup including CBC, BMP, troponin x 2.  EKG personally reviewed and interpreted, unchanged from previous.  Imaging: Ordered chest x-ray  Medications/Fluids: None ordered  Most recent vital signs reviewed and are as follows: BP 103/82   Pulse 62   Temp 98.5 F (36.9 C) (Oral)   Resp 20   SpO2 100%   Initial impression: None ordered  2:05 PM Reassessment performed. Patient appears stable, comfortable.  She was sleeping well.  Labs personally reviewed and interpreted including: CBC unremarkable; BMP slightly low potassium at 3.4 otherwise unremarkable; troponin 6 >> 4.   Imaging personally visualized and interpreted including: Chest x-ray  unremarkable  Reviewed pertinent lab work and imaging with patient at bedside. Questions answered.   Most current vital signs reviewed and are as follows: BP 103/82   Pulse 62   Temp 98.5 F (36.9 C) (Oral)   Resp 20   SpO2 100%   Plan: Discharge to home.   Prescriptions written for: None  Return and follow-up instructions: I encouraged patient to return to ED with severe chest pain, especially if the pain is crushing or pressure-like and spreads to  the arms, back, neck, or jaw, or if they have associated sweating, vomiting, or shortness of breath with the pain, or significant pain with activity. We discussed that the evaluation here today indicates a low-risk of serious cause of chest pain, including heart trouble or a blood clot, but no evaluation is perfect and chest pain can evolve with time. The patient verbalized understanding and agreed.  I encouraged patient to follow-up with their provider in the next 48 hours for recheck.     (all labs ordered are listed, but only abnormal results are displayed) Labs Reviewed  BASIC METABOLIC PANEL WITH GFR - Abnormal; Notable for the following components:      Result Value   Potassium 3.4 (*)    All other components within normal limits  CBC  TROPONIN I (HIGH SENSITIVITY)  TROPONIN I (HIGH SENSITIVITY)    EKG: EKG Interpretation Date/Time:  Monday September 01 2023 09:09:49 EDT Ventricular Rate:  74 PR Interval:  162 QRS Duration:  72 QT Interval:  388 QTC Calculation: 430 R Axis:   26  Text Interpretation: Normal sinus rhythm with sinus arrhythmia Cannot rule out Anterior infarct , age undetermined No significant change since last tracing When compared with ECG of 13-Feb-2023 07:58, PREVIOUS ECG IS PRESENT Confirmed by Doretha Folks ([id number]) on 09/01/2023 9:43:47 AM  Radiology: [facility] Chest 2 View Result Date: 09/01/2023 CLINICAL DATA:  Palpitations EXAM: CHEST - 2 VIEW COMPARISON:  04/15/2023 FINDINGS: Midline trachea. Normal heart  size. Coronary artery stents. No pleural effusion or pneumothorax. No congestive failure. Clear lungs. IMPRESSION: No active cardiopulmonary disease. Electronically Signed   By: Rockey Kilts M.D.   On: 09/01/2023 11:11     Procedures   Medications Ordered in the ED - No data to display                                  Medical Decision Making  For this patient's complaint of chest pain, the following emergent conditions were considered on the differential diagnosis: acute coronary syndrome, pulmonary embolism, pneumothorax, myocarditis, pericardial tamponade, aortic dissection, thoracic aortic aneurysm complication, esophageal perforation.   Other causes were also considered including: gastroesophageal reflux disease, musculoskeletal pain including costochondritis, pneumonia/pleurisy, herpes zoster, pericarditis.  In regards to possibility of ACS, CP was vague and atypical, EKG unchanged, troponin negative x 2.  Patient with history of ACS, however low concern for ACS today.  In regards to possibility of PE, symptoms are atypical for PE/DVT and feel risk profile is low.  Regards to the lightheadedness, unclear etiology.  Patient does work a physically demanding job.  EKG without concerning findings of WPW, ischemia, Brugada syndrome, prolonged QT, heart block, other arrhythmia.  No strokelike symptoms or focal neurodeficits on exam today.  Patient looks well, stable during ED stay.  Advised rest, hydration, close outpatient follow-up.  The patient's vital signs, pertinent lab work and imaging were reviewed and interpreted as discussed in the ED course. Hospitalization was considered for further testing, treatments, or serial exams/observation. However as patient is well-appearing, has a stable exam, and reassuring studies today, I do not feel that they warrant admission at this time. This plan was discussed with the patient who verbalizes agreement and comfort with this plan and seems  reliable and able to return to the Emergency Department with worsening or changing symptoms.        Final diagnoses:  Lightheadedness  Precordial pain  ED Discharge Orders     None          Desiderio Chew, DEVONNA 09/01/23 1409    Doretha Folks, MD 09/01/23 [phone]

## 2023-09-20 ENCOUNTER — Telehealth: Admitting: Nurse Practitioner

## 2023-09-20 DIAGNOSIS — R399 Unspecified symptoms and signs involving the genitourinary system: Secondary | ICD-10-CM

## 2023-09-20 MED ORDER — NITROFURANTOIN MONOHYD MACRO 100 MG PO CAPS: 100.0000 mg | ORAL_CAPSULE | Freq: Two times a day (BID) | ORAL | 0 refills | Status: AC

## 2023-09-20 NOTE — Progress Notes (Signed)
 I have spent 5 minutes in review of e-visit questionnaire, review and updating patient chart, medical decision making and response to patient.   Claiborne Rigg, NP

## 2023-09-20 NOTE — Progress Notes (Signed)

## 2024-01-21 ENCOUNTER — Telehealth: Payer: Self-pay | Admitting: Pharmacy Technician

## 2024-01-21 ENCOUNTER — Other Ambulatory Visit (HOSPITAL_COMMUNITY): Payer: Self-pay

## 2024-01-21 NOTE — Telephone Encounter (Signed)
   Pharmacy Patient Advocate Encounter   Received notification from [employer] that prior authorization for repatha  is required/requested.   Insurance verification completed.   The patient is insured through [employer].   Per test claim: PA required; PA submitted to above mentioned insurance via Latent Key/confirmation #/EOC [id number] Status is pending

## 2024-01-21 NOTE — Telephone Encounter (Signed)
 Pharmacy Patient Advocate Encounter  Received notification from [employer] that Prior Authorization for repatha  has been APPROVED from 01/21/24 to 01/20/25   PA #/Case ID/Reference #: [id number]
